# Patient Record
Sex: Female | Born: 1940
Health system: Southern US, Community
[De-identification: ages and names within clinical notes are randomized; demographics above are authoritative.]

## PROBLEM LIST (undated history)

## (undated) DIAGNOSIS — E119 Type 2 diabetes mellitus without complications: Secondary | ICD-10-CM

## (undated) DIAGNOSIS — I10 Essential (primary) hypertension: Secondary | ICD-10-CM

## (undated) DIAGNOSIS — C50919 Malignant neoplasm of unspecified site of unspecified female breast: Secondary | ICD-10-CM

## (undated) DIAGNOSIS — N182 Chronic kidney disease, stage 2 (mild): Secondary | ICD-10-CM

## (undated) DIAGNOSIS — M1990 Unspecified osteoarthritis, unspecified site: Secondary | ICD-10-CM

## (undated) DIAGNOSIS — E785 Hyperlipidemia, unspecified: Secondary | ICD-10-CM

## (undated) HISTORY — PX: ABDOMINAL HYSTERECTOMY: SHX81

## (undated) HISTORY — DX: Malignant neoplasm of unspecified site of unspecified female breast: C50.919

## (undated) HISTORY — PX: EYE SURGERY: SHX253

## (undated) HISTORY — DX: Essential (primary) hypertension: I10

## (undated) HISTORY — DX: Chronic kidney disease, stage 2 (mild): N18.2

## (undated) HISTORY — DX: Type 2 diabetes mellitus without complications: E11.9

## (undated) HISTORY — DX: Hyperlipidemia, unspecified: E78.5

## (undated) HISTORY — PX: BREAST BIOPSY: SHX20

---

## 2004-12-21 ENCOUNTER — Emergency Department (HOSPITAL_COMMUNITY): Admission: EM | Admit: 2004-12-21 | Discharge: 2004-12-21 | Payer: Self-pay | Admitting: Emergency Medicine

## 2006-04-22 ENCOUNTER — Ambulatory Visit (HOSPITAL_COMMUNITY): Admission: RE | Admit: 2006-04-22 | Discharge: 2006-04-22 | Payer: Self-pay | Admitting: Internal Medicine

## 2006-06-03 ENCOUNTER — Ambulatory Visit (HOSPITAL_COMMUNITY): Admission: RE | Admit: 2006-06-03 | Discharge: 2006-06-03 | Payer: Self-pay | Admitting: Family Medicine

## 2006-06-04 ENCOUNTER — Ambulatory Visit (HOSPITAL_COMMUNITY): Admission: RE | Admit: 2006-06-04 | Discharge: 2006-06-04 | Payer: Self-pay | Admitting: Family Medicine

## 2006-06-07 ENCOUNTER — Encounter (HOSPITAL_COMMUNITY): Admission: RE | Admit: 2006-06-07 | Discharge: 2006-07-07 | Payer: Self-pay | Admitting: Family Medicine

## 2006-06-11 ENCOUNTER — Ambulatory Visit (HOSPITAL_COMMUNITY): Admission: RE | Admit: 2006-06-11 | Discharge: 2006-06-11 | Payer: Self-pay | Admitting: Family Medicine

## 2006-09-20 ENCOUNTER — Ambulatory Visit (HOSPITAL_COMMUNITY): Admission: RE | Admit: 2006-09-20 | Discharge: 2006-09-20 | Payer: Self-pay | Admitting: Family Medicine

## 2006-10-19 ENCOUNTER — Ambulatory Visit (HOSPITAL_COMMUNITY): Admission: RE | Admit: 2006-10-19 | Discharge: 2006-10-19 | Payer: Self-pay | Admitting: General Surgery

## 2007-04-25 ENCOUNTER — Ambulatory Visit (HOSPITAL_COMMUNITY): Admission: RE | Admit: 2007-04-25 | Discharge: 2007-04-25 | Payer: Self-pay | Admitting: Internal Medicine

## 2007-10-11 ENCOUNTER — Emergency Department (HOSPITAL_COMMUNITY): Admission: EM | Admit: 2007-10-11 | Discharge: 2007-10-11 | Payer: Self-pay | Admitting: Emergency Medicine

## 2008-03-27 ENCOUNTER — Emergency Department (HOSPITAL_COMMUNITY): Admission: EM | Admit: 2008-03-27 | Discharge: 2008-03-27 | Payer: Self-pay | Admitting: Emergency Medicine

## 2008-04-25 ENCOUNTER — Ambulatory Visit (HOSPITAL_COMMUNITY): Admission: RE | Admit: 2008-04-25 | Discharge: 2008-04-25 | Payer: Self-pay | Admitting: Internal Medicine

## 2008-10-09 ENCOUNTER — Emergency Department (HOSPITAL_COMMUNITY): Admission: EM | Admit: 2008-10-09 | Discharge: 2008-10-09 | Payer: Self-pay | Admitting: Emergency Medicine

## 2009-01-02 ENCOUNTER — Ambulatory Visit (HOSPITAL_COMMUNITY): Admission: RE | Admit: 2009-01-02 | Discharge: 2009-01-02 | Payer: Self-pay | Admitting: Family Medicine

## 2009-05-01 ENCOUNTER — Ambulatory Visit (HOSPITAL_COMMUNITY): Admission: RE | Admit: 2009-05-01 | Discharge: 2009-05-01 | Payer: Self-pay | Admitting: Internal Medicine

## 2010-01-04 ENCOUNTER — Emergency Department (HOSPITAL_COMMUNITY): Admission: EM | Admit: 2010-01-04 | Discharge: 2010-01-04 | Payer: Self-pay | Admitting: Emergency Medicine

## 2010-02-04 ENCOUNTER — Emergency Department (HOSPITAL_COMMUNITY): Admission: EM | Admit: 2010-02-04 | Discharge: 2010-02-04 | Payer: Self-pay | Admitting: Emergency Medicine

## 2010-02-20 ENCOUNTER — Ambulatory Visit (HOSPITAL_COMMUNITY): Admission: RE | Admit: 2010-02-20 | Discharge: 2010-02-20 | Payer: Self-pay | Admitting: Internal Medicine

## 2010-05-06 ENCOUNTER — Ambulatory Visit (HOSPITAL_COMMUNITY): Admission: RE | Admit: 2010-05-06 | Discharge: 2010-05-06 | Payer: Self-pay | Admitting: Internal Medicine

## 2010-11-17 LAB — DIFFERENTIAL
Basophils Absolute: 0 10*3/uL (ref 0.0–0.1)
Basophils Relative: 0 % (ref 0–1)
Eosinophils Absolute: 0.1 10*3/uL (ref 0.0–0.7)
Eosinophils Relative: 2 % (ref 0–5)
Lymphocytes Relative: 28 % (ref 12–46)
Lymphs Abs: 1.5 10*3/uL (ref 0.7–4.0)
Monocytes Absolute: 0.2 10*3/uL (ref 0.1–1.0)
Monocytes Relative: 4 % (ref 3–12)
Neutro Abs: 3.7 10*3/uL (ref 1.7–7.7)
Neutrophils Relative %: 66 % (ref 43–77)

## 2010-11-17 LAB — COMPREHENSIVE METABOLIC PANEL
ALT: 17 U/L (ref 0–35)
AST: 23 U/L (ref 0–37)
Albumin: 3.9 g/dL (ref 3.5–5.2)
Alkaline Phosphatase: 74 U/L (ref 39–117)
BUN: 23 mg/dL (ref 6–23)
CO2: 31 mEq/L (ref 19–32)
Calcium: 9.8 mg/dL (ref 8.4–10.5)
Chloride: 105 mEq/L (ref 96–112)
Creatinine, Ser: 1.18 mg/dL (ref 0.4–1.2)
GFR calc Af Amer: 55 mL/min — ABNORMAL LOW (ref >60)
GFR calc non Af Amer: 45 mL/min — ABNORMAL LOW (ref >60)
Glucose, Bld: 88 mg/dL (ref 70–99)
Potassium: 4.4 mEq/L (ref 3.5–5.1)
Sodium: 142 mEq/L (ref 135–145)
Total Bilirubin: 0.5 mg/dL (ref 0.3–1.2)
Total Protein: 7.3 g/dL (ref 6.0–8.3)

## 2010-11-17 LAB — URINALYSIS, ROUTINE W REFLEX MICROSCOPIC
Bilirubin Urine: NEGATIVE
Glucose, UA: NEGATIVE mg/dL
Hgb urine dipstick: NEGATIVE
Ketones, ur: NEGATIVE mg/dL
Nitrite: NEGATIVE
Protein, ur: NEGATIVE mg/dL
Specific Gravity, Urine: 1.025 (ref 1.005–1.030)
Urobilinogen, UA: 0.2 mg/dL (ref 0.0–1.0)
pH: 5.5 (ref 5.0–8.0)

## 2010-11-17 LAB — CBC
HCT: 35.2 % — ABNORMAL LOW (ref 36.0–46.0)
Hemoglobin: 11.5 g/dL — ABNORMAL LOW (ref 12.0–15.0)
MCHC: 32.7 g/dL (ref 30.0–36.0)
MCV: 90.2 fL (ref 78.0–100.0)
Platelets: 213 10*3/uL (ref 150–400)
RBC: 3.9 MIL/uL (ref 3.87–5.11)
RDW: 13.6 % (ref 11.5–15.5)
WBC: 5.5 10*3/uL (ref 4.0–10.5)

## 2010-11-17 LAB — D-DIMER, QUANTITATIVE (NOT AT ARMC): D-Dimer, Quant: 0.74 ug/mL-FEU — ABNORMAL HIGH (ref 0.00–0.48)

## 2010-11-17 LAB — POCT CARDIAC MARKERS
CKMB, poc: 1.3 ng/mL (ref 1.0–8.0)
Myoglobin, poc: 95.7 ng/mL (ref 12–200)
Troponin i, poc: <0.05 ng/mL (ref 0.00–0.09)

## 2011-01-16 NOTE — Procedures (Signed)
[name], [name] NO.:  [id number]   MEDICAL RECORD NO.:  [id number]          PATIENT TYPE:  OUT   LOCATION:  RAD                           FACILITY:  [facility]   PHYSICIAN:  Dani Gobble, MD       DATE OF BIRTH:  29-May-1941   DATE OF PROCEDURE:  09/20/2006  DATE OF DISCHARGE:                                ECHOCARDIOGRAM   REFERRING PHYSICIAN:  Dr. Phillips Odor   INDICATIONS:  A 70 year old female with a past medical history of  hypertension and diabetes who was referred for evaluation of a murmur.   The technique quality of this study is adequate.   The aorta measures normally at 3.0 cm.  However, the proximal ascending  aorta measures at the upper limits of normal at 3.63 cm.   The left atrium is mildly dilated, measured at 4.2 cm.  The patient  appeared to be in sinus rhythm during the procedure.   The interventricular septum and posterior wall are mildly thickened with  additional basal septal hypertrophy overlay.   The aortic valve is trileaflet with moderate calcification of the  noncoronary cusp.  Peak velocity across the aortic valve was 1.6 meters  per second, corresponding to a peak gradient of 10 mmHg and a mean  gradient of 6 mmHg.  No significant aortic insufficiency is appreciated.   The mitral valve is notable for mild thickening and calcification but  without limitation of leaflet excursion.  No significant mitral  regurgitation is noted.  Doppler interrogation of the mitral valve is  within normal limits.   The pulmonic valve appears grossly structurally normal.   Tricuspid valve also appears grossly structurally normal with trace to  mild tricuspid regurgitation noted.   The left ventricle is normal in size with LV IDD measuring 3.8 cm and LV  IC measuring 2.7 cm.  Overall ejection fraction is normal.  In the  apical two and three chambers the inferior and posterior wall are  possibly mildly hypokinetic, however they are not  well-visualized from  this.  In no other views do they appear to be hypokinetic.   The right atrium and right ventricle are borderline dilated with  preserved right ventricular systolic function.   IMPRESSION:  1. The proximal ascending aorta is at the upper limits of normal,      measured at 3.6 cm.  2. Mild biatrial enlargement.  #3  Mild concentric left ventricular hypertrophy with additional basal  septal hypertrophy overlay.  1. Mild aortic sclerosis without stenosis.  2. Mildly thickened and calcified mitral valve without limitation to      leaflet excursion.  3. Trace to mild tricuspid regurgitation.  4. Normal left ventricular size and overall ejection fraction.  In one      view only (apical views) the inferior and posterior wall may be      mildly hypokinetic but this is not appreciated in any other view      and overall ejection fraction is, again, normal.  5. Borderline dilatation of the right ventricle with preserved right      ventricular systolic function  ______________________________  Dani Gobble, MD     AB/MEDQ  D:  09/20/2006  T:  09/20/2006  Job:  [id number]   cc:   Corrie Mckusick, M.D.  Fax: [phone]   Dani Gobble, MD  Fax: [phone]

## 2011-01-16 NOTE — H&P (Signed)
[name], [name] NO.:  [id number]   MEDICAL RECORD NO.:  [id number]          PATIENT TYPE:  AMB   LOCATION:                                FACILITY:  [facility]   PHYSICIAN:  Dalia Heading, M.D.  DATE OF BIRTH:  1940-11-30   DATE OF ADMISSION:  DATE OF DISCHARGE:  LH                              HISTORY & PHYSICAL   CHIEF COMPLAINT:  Need for screening colonoscopy, mild anemia.   HISTORY OF PRESENT ILLNESS:  The patient is a 70 year old black female  who is referred for endoscopic evaluation.  She needs a colonoscopy for  screening purposes.  No abdominal pain, weight loss, nausea, vomiting,  diarrhea, constipation, melena or hematochezia have been noted.  There  is no family history of colon carcinoma.  She has a remote history of a  colonoscopy in the past.  She was noted to be mildly anemic on a recent  blood test.   PAST MEDICAL HISTORY:  1. Includes hypertension.  2. Breast cancer.   PAST SURGICAL HISTORY:  Breast cancer surgery.   CURRENT MEDICATIONS:  Benicar.   ALLERGIES:  No known drug allergies.   REVIEW OF SYSTEMS:  Noncontributory.   PHYSICAL EXAMINATION:  The patient is a well-developed, well-nourished  black female in no acute distress.  LUNGS:  Clear to auscultation with equal breath sounds bilaterally.  HEART:  Examination reveals regular rate and rhythm without S3, S4, or  murmurs.  ABDOMEN:  Is soft, nontender, nondistended.  No hepatosplenomegaly or  masses are noted.  RECTAL:  Examination was deferred to the procedure.   IMPRESSION:  1. Need for screening colonoscopy.  2. Anemia.   PLAN:  The patient is scheduled for colonoscopy on October 26, 2006.  The risks and benefits of the procedure including bleeding and  perforation were fully explained to the patient, gave informed consent.      Dalia Heading, M.D.  Electronically Signed     MAJ/MEDQ  D:  10/14/2006  T:  10/14/2006  Job:  [id number]   cc:   Corrie Mckusick,  M.D.  Fax: [phone]

## 2011-01-23 ENCOUNTER — Other Ambulatory Visit (HOSPITAL_COMMUNITY): Payer: Self-pay | Admitting: Internal Medicine

## 2011-01-23 DIAGNOSIS — IMO0001 Reserved for inherently not codable concepts without codable children: Secondary | ICD-10-CM

## 2011-01-29 ENCOUNTER — Ambulatory Visit (HOSPITAL_COMMUNITY)
Admission: RE | Admit: 2011-01-29 | Discharge: 2011-01-29 | Disposition: A | Discharge status: Home / Self Care | Payer: Medicare HMO | Source: Ambulatory Visit | Attending: Internal Medicine | Admitting: Internal Medicine

## 2011-01-29 ENCOUNTER — Encounter (HOSPITAL_COMMUNITY): Payer: Self-pay

## 2011-01-29 DIAGNOSIS — IMO0001 Reserved for inherently not codable concepts without codable children: Secondary | ICD-10-CM

## 2011-01-29 DIAGNOSIS — Z780 Asymptomatic menopausal state: Secondary | ICD-10-CM | POA: Insufficient documentation

## 2011-04-07 ENCOUNTER — Other Ambulatory Visit (HOSPITAL_COMMUNITY): Payer: Self-pay | Admitting: Internal Medicine

## 2011-04-07 DIAGNOSIS — Z139 Encounter for screening, unspecified: Secondary | ICD-10-CM

## 2011-05-05 ENCOUNTER — Other Ambulatory Visit (HOSPITAL_COMMUNITY): Payer: Self-pay | Admitting: Family Medicine

## 2011-05-05 ENCOUNTER — Ambulatory Visit (HOSPITAL_COMMUNITY)
Admission: RE | Admit: 2011-05-05 | Discharge: 2011-05-05 | Disposition: A | Discharge status: Home / Self Care | Payer: Medicare HMO | Source: Ambulatory Visit | Attending: Family Medicine | Admitting: Family Medicine

## 2011-05-05 ENCOUNTER — Encounter (HOSPITAL_COMMUNITY): Payer: Self-pay

## 2011-05-05 DIAGNOSIS — M25529 Pain in unspecified elbow: Secondary | ICD-10-CM | POA: Insufficient documentation

## 2011-05-05 DIAGNOSIS — M898X9 Other specified disorders of bone, unspecified site: Secondary | ICD-10-CM | POA: Insufficient documentation

## 2011-05-05 DIAGNOSIS — M779 Enthesopathy, unspecified: Secondary | ICD-10-CM

## 2011-05-05 DIAGNOSIS — J069 Acute upper respiratory infection, unspecified: Secondary | ICD-10-CM

## 2011-05-11 ENCOUNTER — Ambulatory Visit (HOSPITAL_COMMUNITY)
Admission: RE | Admit: 2011-05-11 | Discharge: 2011-05-11 | Disposition: A | Discharge status: Home / Self Care | Payer: Medicare HMO | Source: Ambulatory Visit | Attending: Internal Medicine | Admitting: Internal Medicine

## 2011-05-11 DIAGNOSIS — Z1231 Encounter for screening mammogram for malignant neoplasm of breast: Secondary | ICD-10-CM | POA: Insufficient documentation

## 2011-05-11 DIAGNOSIS — Z139 Encounter for screening, unspecified: Secondary | ICD-10-CM

## 2011-05-22 LAB — URINALYSIS, ROUTINE W REFLEX MICROSCOPIC
Bilirubin Urine: NEGATIVE
Glucose, UA: NEGATIVE
Ketones, ur: NEGATIVE
Nitrite: NEGATIVE
Protein, ur: NEGATIVE
Specific Gravity, Urine: 1.025
Urobilinogen, UA: 0.2
pH: 5.5

## 2011-05-22 LAB — URINE MICROSCOPIC-ADD ON

## 2011-05-29 LAB — BASIC METABOLIC PANEL
BUN: 24 — ABNORMAL HIGH
CO2: 26
Calcium: 9.5
Chloride: 106
Creatinine, Ser: 1.73 — ABNORMAL HIGH
GFR calc Af Amer: 35 — ABNORMAL LOW
GFR calc non Af Amer: 29 — ABNORMAL LOW
Glucose, Bld: 91
Potassium: 5.2 — ABNORMAL HIGH
Sodium: 137

## 2011-05-29 LAB — POCT CARDIAC MARKERS
CKMB, poc: 2.1
Myoglobin, poc: 81
Troponin i, poc: <0.05

## 2011-05-29 LAB — CBC
HCT: 36.8
Hemoglobin: 12.1
MCHC: 33
MCV: 88.4
Platelets: 240
RBC: 4.16
RDW: 13.3
WBC: 4.3

## 2011-05-29 LAB — DIFFERENTIAL
Basophils Absolute: 0
Basophils Relative: 1
Eosinophils Absolute: 0.1
Eosinophils Relative: 2
Lymphocytes Relative: 34
Lymphs Abs: 1.5
Monocytes Absolute: 0.4
Monocytes Relative: 10
Neutro Abs: 2.3
Neutrophils Relative %: 53

## 2011-12-01 ENCOUNTER — Ambulatory Visit (HOSPITAL_COMMUNITY)
Admission: RE | Admit: 2011-12-01 | Discharge: 2011-12-01 | Disposition: A | Discharge status: Home / Self Care | Payer: Medicare HMO | Source: Ambulatory Visit | Attending: Internal Medicine | Admitting: Internal Medicine

## 2011-12-01 ENCOUNTER — Other Ambulatory Visit (HOSPITAL_COMMUNITY): Payer: Self-pay | Admitting: Internal Medicine

## 2011-12-01 DIAGNOSIS — M47812 Spondylosis without myelopathy or radiculopathy, cervical region: Secondary | ICD-10-CM | POA: Insufficient documentation

## 2011-12-01 DIAGNOSIS — R51 Headache: Secondary | ICD-10-CM

## 2012-04-18 ENCOUNTER — Encounter (INDEPENDENT_AMBULATORY_CARE_PROVIDER_SITE_OTHER): Payer: Medicare HMO | Admitting: Ophthalmology

## 2012-04-21 ENCOUNTER — Encounter (INDEPENDENT_AMBULATORY_CARE_PROVIDER_SITE_OTHER): Payer: Medicare HMO | Admitting: Ophthalmology

## 2012-04-21 DIAGNOSIS — E1165 Type 2 diabetes mellitus with hyperglycemia: Secondary | ICD-10-CM

## 2012-04-21 DIAGNOSIS — E1139 Type 2 diabetes mellitus with other diabetic ophthalmic complication: Secondary | ICD-10-CM

## 2012-04-21 DIAGNOSIS — H2510 Age-related nuclear cataract, unspecified eye: Secondary | ICD-10-CM

## 2012-04-21 DIAGNOSIS — I10 Essential (primary) hypertension: Secondary | ICD-10-CM

## 2012-04-21 DIAGNOSIS — E11319 Type 2 diabetes mellitus with unspecified diabetic retinopathy without macular edema: Secondary | ICD-10-CM

## 2012-04-21 DIAGNOSIS — H35039 Hypertensive retinopathy, unspecified eye: Secondary | ICD-10-CM

## 2012-04-21 DIAGNOSIS — H43819 Vitreous degeneration, unspecified eye: Secondary | ICD-10-CM

## 2012-05-12 ENCOUNTER — Other Ambulatory Visit (HOSPITAL_COMMUNITY): Payer: Self-pay | Admitting: Internal Medicine

## 2012-05-12 DIAGNOSIS — Z139 Encounter for screening, unspecified: Secondary | ICD-10-CM

## 2012-05-16 ENCOUNTER — Ambulatory Visit (HOSPITAL_COMMUNITY)
Admission: RE | Admit: 2012-05-16 | Discharge: 2012-05-16 | Disposition: A | Discharge status: Home / Self Care | Payer: Medicare HMO | Source: Ambulatory Visit | Attending: Internal Medicine | Admitting: Internal Medicine

## 2012-05-16 DIAGNOSIS — Z139 Encounter for screening, unspecified: Secondary | ICD-10-CM

## 2012-05-16 DIAGNOSIS — Z1231 Encounter for screening mammogram for malignant neoplasm of breast: Secondary | ICD-10-CM | POA: Insufficient documentation

## 2012-11-10 ENCOUNTER — Other Ambulatory Visit (HOSPITAL_COMMUNITY): Payer: Self-pay | Admitting: Family Medicine

## 2012-11-10 DIAGNOSIS — M5030 Other cervical disc degeneration, unspecified cervical region: Secondary | ICD-10-CM

## 2012-11-14 ENCOUNTER — Encounter (HOSPITAL_COMMUNITY): Payer: Self-pay

## 2012-11-14 ENCOUNTER — Ambulatory Visit (HOSPITAL_COMMUNITY)
Admission: RE | Admit: 2012-11-14 | Discharge: 2012-11-14 | Disposition: A | Discharge status: Home / Self Care | Payer: Medicare HMO | Source: Ambulatory Visit | Attending: Family Medicine | Admitting: Family Medicine

## 2012-11-14 DIAGNOSIS — M5380 Other specified dorsopathies, site unspecified: Secondary | ICD-10-CM | POA: Insufficient documentation

## 2012-11-14 DIAGNOSIS — M5030 Other cervical disc degeneration, unspecified cervical region: Secondary | ICD-10-CM

## 2012-11-14 DIAGNOSIS — M542 Cervicalgia: Secondary | ICD-10-CM | POA: Insufficient documentation

## 2012-11-14 DIAGNOSIS — R209 Unspecified disturbances of skin sensation: Secondary | ICD-10-CM | POA: Insufficient documentation

## 2012-11-14 DIAGNOSIS — M5020 Other cervical disc displacement, unspecified cervical region: Secondary | ICD-10-CM | POA: Insufficient documentation

## 2013-01-19 ENCOUNTER — Other Ambulatory Visit (HOSPITAL_COMMUNITY): Payer: Self-pay | Admitting: Internal Medicine

## 2013-01-19 DIAGNOSIS — M549 Dorsalgia, unspecified: Secondary | ICD-10-CM

## 2013-01-24 ENCOUNTER — Ambulatory Visit (HOSPITAL_COMMUNITY): Payer: Medicare HMO

## 2013-01-25 ENCOUNTER — Ambulatory Visit (HOSPITAL_COMMUNITY)
Admission: RE | Admit: 2013-01-25 | Discharge: 2013-01-25 | Disposition: A | Discharge status: Home / Self Care | Payer: Medicare HMO | Source: Ambulatory Visit | Attending: Internal Medicine | Admitting: Internal Medicine

## 2013-01-25 ENCOUNTER — Encounter (HOSPITAL_COMMUNITY): Payer: Self-pay

## 2013-01-25 DIAGNOSIS — M5126 Other intervertebral disc displacement, lumbar region: Secondary | ICD-10-CM | POA: Insufficient documentation

## 2013-01-25 DIAGNOSIS — M545 Low back pain, unspecified: Secondary | ICD-10-CM | POA: Insufficient documentation

## 2013-01-25 DIAGNOSIS — M549 Dorsalgia, unspecified: Secondary | ICD-10-CM

## 2013-03-29 ENCOUNTER — Other Ambulatory Visit (HOSPITAL_COMMUNITY): Payer: Self-pay | Admitting: Nephrology

## 2013-03-29 DIAGNOSIS — N289 Disorder of kidney and ureter, unspecified: Secondary | ICD-10-CM

## 2013-04-10 ENCOUNTER — Ambulatory Visit (HOSPITAL_COMMUNITY)
Admission: RE | Admit: 2013-04-10 | Discharge: 2013-04-10 | Disposition: A | Discharge status: Home / Self Care | Payer: Medicare HMO | Source: Ambulatory Visit | Attending: Nephrology | Admitting: Nephrology

## 2013-04-10 DIAGNOSIS — N289 Disorder of kidney and ureter, unspecified: Secondary | ICD-10-CM | POA: Insufficient documentation

## 2013-04-18 ENCOUNTER — Other Ambulatory Visit (HOSPITAL_COMMUNITY): Payer: Self-pay | Admitting: Internal Medicine

## 2013-04-18 DIAGNOSIS — Z139 Encounter for screening, unspecified: Secondary | ICD-10-CM

## 2013-04-21 ENCOUNTER — Ambulatory Visit (INDEPENDENT_AMBULATORY_CARE_PROVIDER_SITE_OTHER): Payer: Medicare HMO | Admitting: Ophthalmology

## 2013-05-18 ENCOUNTER — Ambulatory Visit (HOSPITAL_COMMUNITY)
Admission: RE | Admit: 2013-05-18 | Discharge: 2013-05-18 | Disposition: A | Discharge status: Home / Self Care | Payer: Medicare HMO | Source: Ambulatory Visit | Attending: Internal Medicine | Admitting: Internal Medicine

## 2013-05-18 DIAGNOSIS — Z139 Encounter for screening, unspecified: Secondary | ICD-10-CM

## 2013-05-18 DIAGNOSIS — Z1231 Encounter for screening mammogram for malignant neoplasm of breast: Secondary | ICD-10-CM | POA: Insufficient documentation

## 2014-05-15 ENCOUNTER — Other Ambulatory Visit (HOSPITAL_COMMUNITY): Payer: Self-pay | Admitting: Internal Medicine

## 2014-05-15 DIAGNOSIS — Z139 Encounter for screening, unspecified: Secondary | ICD-10-CM

## 2014-05-24 ENCOUNTER — Ambulatory Visit (HOSPITAL_COMMUNITY)
Admission: RE | Admit: 2014-05-24 | Discharge: 2014-05-24 | Disposition: A | Discharge status: Home / Self Care | Payer: Medicare HMO | Source: Ambulatory Visit | Attending: Internal Medicine | Admitting: Internal Medicine

## 2014-05-24 DIAGNOSIS — Z139 Encounter for screening, unspecified: Secondary | ICD-10-CM

## 2014-05-24 DIAGNOSIS — Z1231 Encounter for screening mammogram for malignant neoplasm of breast: Secondary | ICD-10-CM | POA: Insufficient documentation

## 2014-06-03 ENCOUNTER — Observation Stay (HOSPITAL_COMMUNITY)
Admission: EM | Admit: 2014-06-03 | Discharge: 2014-06-03 | Disposition: A | Discharge status: Home / Self Care | Payer: Medicare HMO | Attending: Emergency Medicine | Admitting: Emergency Medicine

## 2014-06-03 ENCOUNTER — Encounter (HOSPITAL_COMMUNITY): Payer: Self-pay | Admitting: Emergency Medicine

## 2014-06-03 ENCOUNTER — Emergency Department (HOSPITAL_COMMUNITY): Payer: Medicare HMO

## 2014-06-03 DIAGNOSIS — I10 Essential (primary) hypertension: Secondary | ICD-10-CM | POA: Diagnosis not present

## 2014-06-03 DIAGNOSIS — E119 Type 2 diabetes mellitus without complications: Secondary | ICD-10-CM | POA: Diagnosis not present

## 2014-06-03 DIAGNOSIS — R079 Chest pain, unspecified: Secondary | ICD-10-CM | POA: Diagnosis present

## 2014-06-03 DIAGNOSIS — I369 Nonrheumatic tricuspid valve disorder, unspecified: Secondary | ICD-10-CM

## 2014-06-03 DIAGNOSIS — Z79899 Other long term (current) drug therapy: Secondary | ICD-10-CM | POA: Insufficient documentation

## 2014-06-03 DIAGNOSIS — R0602 Shortness of breath: Secondary | ICD-10-CM | POA: Diagnosis not present

## 2014-06-03 DIAGNOSIS — R739 Hyperglycemia, unspecified: Secondary | ICD-10-CM

## 2014-06-03 DIAGNOSIS — E871 Hypo-osmolality and hyponatremia: Secondary | ICD-10-CM

## 2014-06-03 LAB — BASIC METABOLIC PANEL
Anion gap: 14 (ref 5–15)
BUN: 19 mg/dL (ref 6–23)
CO2: 22 mEq/L (ref 19–32)
Calcium: 9.2 mg/dL (ref 8.4–10.5)
Chloride: 97 mEq/L (ref 96–112)
Creatinine, Ser: 1.24 mg/dL — ABNORMAL HIGH (ref 0.50–1.10)
GFR calc Af Amer: 48 mL/min — ABNORMAL LOW (ref >90)
GFR calc non Af Amer: 42 mL/min — ABNORMAL LOW (ref >90)
Glucose, Bld: 180 mg/dL — ABNORMAL HIGH (ref 70–99)
Potassium: 4.4 mEq/L (ref 3.7–5.3)
Sodium: 133 mEq/L — ABNORMAL LOW (ref 137–147)

## 2014-06-03 LAB — TROPONIN I
Troponin I: <0.3 ng/mL (ref <0.30)
Troponin I: <0.3 ng/mL (ref <0.30)

## 2014-06-03 LAB — LIPID PANEL
Cholesterol: 188 mg/dL (ref 0–200)
HDL: 62 mg/dL (ref >39)
LDL Cholesterol: 99 mg/dL (ref 0–99)
Total CHOL/HDL Ratio: 3 RATIO
Triglycerides: 137 mg/dL (ref <150)
VLDL: 27 mg/dL (ref 0–40)

## 2014-06-03 LAB — CBC WITH DIFFERENTIAL/PLATELET
Basophils Absolute: 0 10*3/uL (ref 0.0–0.1)
Basophils Relative: 0 % (ref 0–1)
Eosinophils Absolute: 0 10*3/uL (ref 0.0–0.7)
Eosinophils Relative: 0 % (ref 0–5)
HCT: 34.8 % — ABNORMAL LOW (ref 36.0–46.0)
Hemoglobin: 11.4 g/dL — ABNORMAL LOW (ref 12.0–15.0)
Lymphocytes Relative: 13 % (ref 12–46)
Lymphs Abs: 0.6 10*3/uL — ABNORMAL LOW (ref 0.7–4.0)
MCH: 29.3 pg (ref 26.0–34.0)
MCHC: 32.8 g/dL (ref 30.0–36.0)
MCV: 89.5 fL (ref 78.0–100.0)
Monocytes Absolute: 0.3 10*3/uL (ref 0.1–1.0)
Monocytes Relative: 6 % (ref 3–12)
Neutro Abs: 3.9 10*3/uL (ref 1.7–7.7)
Neutrophils Relative %: 81 % — ABNORMAL HIGH (ref 43–77)
Platelets: 247 10*3/uL (ref 150–400)
RBC: 3.89 MIL/uL (ref 3.87–5.11)
RDW: 13.2 % (ref 11.5–15.5)
WBC: 4.8 10*3/uL (ref 4.0–10.5)

## 2014-06-03 LAB — HEMOGLOBIN A1C
Hgb A1c MFr Bld: 7.1 % — ABNORMAL HIGH (ref <5.7)
Mean Plasma Glucose: 157 mg/dL — ABNORMAL HIGH (ref <117)

## 2014-06-03 LAB — I-STAT TROPONIN, ED: Troponin i, poc: 0.01 ng/mL (ref 0.00–0.08)

## 2014-06-03 MED ORDER — ENOXAPARIN SODIUM 30 MG/0.3ML ~~LOC~~ SOLN
30.0000 mg | SUBCUTANEOUS | Status: DC
  Administered 2014-06-03: 30 mg via SUBCUTANEOUS
  Filled 2014-06-03: qty 0.3

## 2014-06-03 MED ORDER — ASPIRIN EC 325 MG PO TBEC
325.0000 mg | DELAYED_RELEASE_TABLET | Freq: Every day | ORAL | Status: DC
  Administered 2014-06-03: 325 mg via ORAL
  Filled 2014-06-03: qty 1

## 2014-06-03 MED ORDER — SODIUM CHLORIDE 0.9 % IJ SOLN
3.0000 mL | Freq: Two times a day (BID) | INTRAMUSCULAR | Status: DC
Start: 2014-06-03 — End: 2014-06-03
  Administered 2014-06-03: 3 mL via INTRAVENOUS

## 2014-06-03 MED ORDER — ACETAMINOPHEN 325 MG PO TABS
650.0000 mg | ORAL_TABLET | Freq: Four times a day (QID) | ORAL | Status: DC | PRN
  Administered 2014-06-03: 650 mg via ORAL
  Filled 2014-06-03: qty 2

## 2014-06-03 MED ORDER — LOSARTAN POTASSIUM 50 MG PO TABS
100.0000 mg | ORAL_TABLET | Freq: Every day | ORAL | Status: DC
  Administered 2014-06-03: 100 mg via ORAL
  Filled 2014-06-03: qty 2

## 2014-06-03 MED ORDER — ASPIRIN 81 MG PO CHEW
CHEWABLE_TABLET | ORAL | Status: AC
  Filled 2014-06-03: qty 1

## 2014-06-03 MED ORDER — ENOXAPARIN SODIUM 40 MG/0.4ML ~~LOC~~ SOLN: 40.0000 mg | SUBCUTANEOUS | Status: DC

## 2014-06-03 MED ORDER — ASPIRIN EC 81 MG PO TBEC: 81.0000 mg | DELAYED_RELEASE_TABLET | Freq: Every day | ORAL | Status: DC

## 2014-06-03 MED ORDER — NITROGLYCERIN 0.4 MG SL SUBL
0.4000 mg | SUBLINGUAL_TABLET | Freq: Once | SUBLINGUAL | Status: AC
  Administered 2014-06-03: 0.4 mg via SUBLINGUAL

## 2014-06-03 MED ORDER — SODIUM CHLORIDE 0.9 % IV SOLN
INTRAVENOUS | Status: DC
  Administered 2014-06-03: 06:00:00 via INTRAVENOUS

## 2014-06-03 MED ORDER — NITROGLYCERIN 0.4 MG SL SUBL
SUBLINGUAL_TABLET | SUBLINGUAL | Status: AC
  Filled 2014-06-03: qty 1

## 2014-06-03 MED ORDER — ACETAMINOPHEN 650 MG RE SUPP: 650.0000 mg | Freq: Four times a day (QID) | RECTAL | Status: DC | PRN

## 2014-06-03 MED ORDER — ASPIRIN 81 MG PO CHEW
CHEWABLE_TABLET | ORAL | Status: AC
  Filled 2014-06-03: qty 4

## 2014-06-03 MED ORDER — ASPIRIN 81 MG PO CHEW
324.0000 mg | CHEWABLE_TABLET | Freq: Once | ORAL | Status: AC
  Administered 2014-06-03: 324 mg via ORAL

## 2014-06-03 NOTE — Discharge Summary (Signed)
Physician Discharge Summary  [name] BEE:[id number] DOB: 08/01/1941 DOA: 06/03/2014  PCP: Glo Herring., MD  Admit date: 06/03/2014 Discharge date: 06/03/2014  Recommendations for Outpatient Follow-up:  1. Chest pain, atypical. Plan outpatient followup with PCP and cardiology.    Follow-up Information   Follow up with Glo Herring., MD. Schedule an appointment as soon as possible for a visit in 1 week.   Specialty:  Internal Medicine   Contact information:   [address] [place] [address] [phone]      Discharge Diagnoses:  1. Chest pain, atypical 2. Hyponatremia 3. HTN 4. DM  Discharge Condition: improved Disposition: home  Diet recommendation: heart healthy  Filed Weights   06/03/14 0200 06/03/14 0314  Weight: 88.678 kg (195 lb 8 oz) 88.4 kg (194 lb 14.2 oz)    History of present illness:  73yow presented with substernal chest pressure, treated in ED with relief of pain, admitted for CP evaluation.  Hospital Course:  [name] was observed overnight, had no recurrent pain and ruled out for ACS. Noncardiac CP suspected. Plan home with outpatient follow-up. Counseled to return to for pain.  1. Chest pain, resolved, some typical/atypical features.  2. Hyponatremia, minimal, secondary to HCTZ, f/u as an outpatient. 3. DM stable. 4. HTN. Stable. No recurrent pain, suspect noncardiac. Troponins negative, telemetry SR, echo reassuring, no evidence of ACS. Patient wants to go home, there is no cardiology or stress echo on weekends. Feel patient low risk, can f/u as outpatient.  Plan start ASA 81mg  and f/u with PCP and cardiology as an outpatient.   Consultants: none Procedures:  2-D echocardiogram. LVEF 55-60%. Normal wall motion, no regional wall motion abnormalities. Grade 1 diastolic dysfunction.  Discharge Instructions  Discharge Instructions   Activity as tolerated - No restrictions    Complete by:  As directed      Diet -  low sodium heart healthy    Complete by:  As directed      Diet Carb Modified    Complete by:  As directed      Discharge instructions    Complete by:  As directed   Call your physician or seek immediate medical attention for chest pain, shortness of breath or worsening of condition.          Current Discharge Medication List    START taking these medications   Details  aspirin EC 81 MG tablet Take 1 tablet (81 mg total) by mouth daily.      CONTINUE these medications which have NOT CHANGED   Details  ferrous sulfate 325 (65 FE) MG tablet Take 1 tablet by mouth daily.    losartan-hydrochlorothiazide (HYZAAR) 100-25 MG per tablet Take 1 tablet by mouth daily.      STOP taking these medications     losartan (COZAAR) 100 MG tablet        No Known Allergies  The results of significant diagnostics from this hospitalization (including imaging, microbiology, ancillary and laboratory) are listed below for reference.    Significant Diagnostic Studies: Dg Chest Portable 1 View  06/03/2014   CLINICAL DATA:  Midsternal chest pain starting at 7 p.m. Diarrhea for 1 day.  EXAM: PORTABLE CHEST - 1 VIEW  COMPARISON:  02/04/2010  FINDINGS: The heart size and mediastinal contours are within normal limits. Both lungs are clear. The visualized skeletal structures are unremarkable.  IMPRESSION: No active disease.   Electronically Signed   By: Lucienne Capers M.D.   On: 06/03/2014 01:43  Labs: Basic Metabolic Panel:  Recent Labs Lab 06/03/14 0052  NA 133*  K 4.4  CL 97  CO2 22  GLUCOSE 180*  BUN 19  CREATININE 1.24*  CALCIUM 9.2   CBC:  Recent Labs Lab 06/03/14 0052  WBC 4.8  NEUTROABS 3.9  HGB 11.4*  HCT 34.8*  MCV 89.5  PLT 247   Cardiac Enzymes:  Recent Labs Lab 06/03/14 0811 06/03/14 1335  TROPONINI <0.30 <0.30    Principal Problem:   Chest pain Active Problems:   Chest pain, rule out acute myocardial infarction   Essential hypertension    Hyperglycemia   Time coordinating discharge: 35 minutes  Signed:  [name], MD [facility] 06/03/2014, 4:59 PM

## 2014-06-03 NOTE — Progress Notes (Signed)
  Echocardiogram 2D Echocardiogram has been performed.  Avanell Shackleton M 06/03/2014, 9:59 AM

## 2014-06-03 NOTE — ED Notes (Signed)
Pt reports mid sternal chest pain that started approx 7 pm, no associated symptoms

## 2014-06-03 NOTE — Progress Notes (Signed)
  PROGRESS NOTE  [name] MOQ:[id number] DOB: [birth year] DOA: 06/03/2014 PCP: Glo Herring., MD  Summary: 30yow presented with substernal chest pressure, treated in ED with relief of pain, admitted for CP evaluation.  Assessment/Plan: 1. Chest pain, resolved, some typical/atypical features.  2. Hyponatremia, minimal, secondary to HCTZ, f/u as an outpatient. 3. DM stable. 4. HTN. Stable.   No recurrent pain, suspect noncardiac. Troponins negative, telemetry SR, echo reassuring, no evidence of ACS. Patient wants to go home, there is no cardiology or stress echo on weekends. Feel patient low risk, can f/u as outpatient.  Plan start ASA 81mg  and f/u with PCP and cardiology as an outpatient.   [name], MD  [facility]  Pager [phone] If 7PM-7AM, please contact night-coverage at [email or web address], password [facility] 06/03/2014, 2:31 PM  LOS: 0 days   Consultants:    Procedures:  2-D echocardiogram. LVEF 55-60%. Normal wall motion, no regional wall motion abnormalities. Grade 1 diastolic dysfunction.  Antibiotics:    HPI/Subjective: No recurrent chest pain, no shortness of breath. No previous CP or SOB with activities.   Objective: Filed Vitals:   06/03/14 0130 06/03/14 0200 06/03/14 0314 06/03/14 0624  BP: 137/85 130/88 137/71 145/76  Pulse: 79 71 64 62  Temp:   98 F (36.7 C) 98.7 F (37.1 C)  TempSrc:   Oral Oral  Resp: 15 10 16 18   Height:  5\' 3"  (1.6 m) 5\' 3"  (1.6 m)   Weight:  88.678 kg (195 lb 8 oz) 88.4 kg (194 lb 14.2 oz)   SpO2: 98% 98% 99% 100%    Intake/Output Summary (Last 24 hours) at 06/03/14 1431 Last data filed at 06/03/14 0600  Gross per 24 hour  Intake      0 ml  Output      1 ml  Net     -1 ml     Filed Weights   06/03/14 0200 06/03/14 0314  Weight: 88.678 kg (195 lb 8 oz) 88.4 kg (194 lb 14.2 oz)    Exam:     Afebrile, VSS, no hypoxia   General: appears calm, comfortable  Psych: alert, speech fluent and  clear  CV: RRR no m/r/g.   Respiratory: CTA bilaterally, no w/r/r. Normal resp effort.  Data Reviewed:  Na+ 133, creatinine 1.24 (close to 2011 value)  troponins negative  LDL 99  Hgb 11.4, stable compared to 2011  CXR NAD  EKG NSR, suspect LVH, cannot r/o anteroseptal MI , compared to EKG 02/04/2010 viewed via Muse no acute changes seen.   Scheduled Meds: . aspirin EC  325 mg Oral Daily  . [START ON 06/04/2014] enoxaparin (LOVENOX) injection  40 mg Subcutaneous Q24H  . losartan  100 mg Oral Daily  . sodium chloride  3 mL Intravenous Q12H   Continuous Infusions: . sodium chloride 75 mL/hr at 06/03/14 0554    Principal Problem:   Chest pain Active Problems:   Chest pain, rule out acute myocardial infarction   Essential hypertension   Hyperglycemia

## 2014-06-03 NOTE — ED Provider Notes (Signed)
CSN: [id number]     Arrival date & time 06/03/14  0038 History   First MD Initiated Contact with Patient 06/03/14 0050     Chief Complaint  Patient presents with  . Chest Pain      Patient is a 73 y.o. female presenting with chest pain. The history is provided by the patient.  Chest Pain Pain location:  Substernal area Pain quality comment:  Heaviness Pain radiates to:  Does not radiate Pain severity:  Moderate Onset quality:  Gradual Duration:  5 hours Timing:  Constant Progression:  Improving Chronicity:  New Worsened by:  Exertion Associated symptoms: shortness of breath   Associated symptoms: no cough, no diaphoresis, no syncope, not vomiting and no weakness   PT reports onset of chest pain earlier tonight She has never had this pain previously She reports CP/SOB seemed worse with exertion  She had otherwise been well prior to this episode  Denies pleuritic type CP  Past Medical History  Diagnosis Date  . Diabetes mellitus   . Hypertension    History reviewed. No pertinent past surgical history. No family history on file. History  Substance Use Topics  . Smoking status: Never Smoker   . Smokeless tobacco: Not on file  . Alcohol Use: No   OB History   Grav Para Term Preterm Abortions TAB SAB Ect Mult Living                 Review of Systems  Constitutional: Negative for diaphoresis.  Respiratory: Positive for shortness of breath. Negative for cough.   Cardiovascular: Positive for chest pain. Negative for syncope.  Gastrointestinal: Negative for vomiting.  Neurological: Negative for syncope and weakness.  All other systems reviewed and are negative.     Allergies  Review of patient's allergies indicates no known allergies.  Home Medications   Prior to Admission medications   Medication Sig Start Date End Date Taking? Authorizing Provider  losartan (COZAAR) 100 MG tablet Take 100 mg by mouth daily.   Yes Historical Provider, MD   BP 121/80  Pulse  88  Resp 21  SpO2 98% Physical Exam CONSTITUTIONAL: Well developed/well nourished HEAD: Normocephalic/atraumatic EYES: EOMI/PERRL ENMT: Mucous membranes moist NECK: supple no meningeal signs SPINE:entire spine nontender CV: S1/S2 noted, no murmurs/rubs/gallops noted LUNGS: Lungs are clear to auscultation bilaterally, no apparent distress ABDOMEN: soft, nontender, no rebound or guarding GU:no cva tenderness NEURO: Pt is awake/alert, moves all extremitiesx4 EXTREMITIES: pulses normal, full ROM, no calf tenderness or edema noted SKIN: warm, color normal PSYCH: no abnormalities of mood noted  ED Course  Procedures  1:19 AM Pt given ASA/NTG and her pain is improved She is clinically stable at this time She has abnormal EKG at baseline, tonight is c/w prior EKG, NO STEMI 2:02 AM Patient is CP free at this time She is in no distress/smiling Would benefit from observeration admission due to age/risk factors for CAD Pt agreeable D/w dr Maudie Mercury, will admit Labs Review Labs Reviewed  BASIC METABOLIC PANEL - Abnormal; Notable for the following:    Sodium 133 (*)    Glucose, Bld 180 (*)    Creatinine, Ser 1.24 (*)    GFR calc non Af Amer 42 (*)    GFR calc Af Amer 48 (*)    All other components within normal limits  CBC WITH DIFFERENTIAL - Abnormal; Notable for the following:    Hemoglobin 11.4 (*)    HCT 34.8 (*)    Neutrophils Relative % 81 (*)  Lymphs Abs 0.6 (*)    All other components within normal limits  I-STAT TROPOININ, ED    Imaging Review Dg Chest Portable 1 View  06/03/2014   CLINICAL DATA:  Midsternal chest pain starting at 7 p.m. Diarrhea for 1 day.  EXAM: PORTABLE CHEST - 1 VIEW  COMPARISON:  02/04/2010  FINDINGS: The heart size and mediastinal contours are within normal limits. Both lungs are clear. The visualized skeletal structures are unremarkable.  IMPRESSION: No active disease.   Electronically Signed   By: Lucienne Capers M.D.   On: 06/03/2014 01:43      EKG Interpretation   Date/Time:  Sunday June 03 2014 00:51:26 EDT Ventricular Rate:  75 PR Interval:  167 QRS Duration: 90 QT Interval:  361 QTC Calculation: 403 R Axis:   -44 Text Interpretation:  Sinus rhythm Left anterior fascicular block Abnormal  R-wave progression, late transition Consider left ventricular hypertrophy  st elevation anteriorly seen in prior Abnormal ekg pt now has inverted t  wave in aVL, changed from prior Confirmed by [place] ([id number])  on 06/03/2014 12:58:39 AM      MDM   Final diagnoses:  Chest pain, rule out acute myocardial infarction    Nursing notes including past medical history and social history reviewed and considered in documentation xrays reviewed and considered Labs/vital reviewed and considered     Sharyon Cable, MD 06/03/14 [phone]

## 2014-06-03 NOTE — H&P (Signed)
[name] is an 73 y.o. female.   Chief Complaint: chest pain HPI: 73 yo female with  Hypertension c/o cp,  Substernal pressure without radiation,  Starting about 7pm, and which continued so at 11pm  She decided to come to ER for evaluation.  Pt denies fever chills, cough, sob, n/v, diarrhea, brbpr, black stool.  EKG: nsr with j point elevation in the anterior leads, apparently old.  Trop negative. Pt will be admitted for cp  Past Medical History  Diagnosis Date  . Diabetes mellitus   . Hypertension     Fhx:  Negative for cad History reviewed. No pertinent past surgical history.  No family history on file. Social History:  reports that she has never smoked. She does not have any smokeless tobacco history on file. She reports that she does not drink alcohol or use illicit drugs.  Allergies: No Known Allergies   (Not in a hospital admission)  Results for orders placed during the hospital encounter of 06/03/14 (from the past 48 hour(s))  BASIC METABOLIC PANEL     Status: Abnormal   Collection Time    06/03/14 12:52 AM      Result Value Ref Range   Sodium 133 (*) 137 - 147 mEq/L   Potassium 4.4  3.7 - 5.3 mEq/L   Chloride 97  96 - 112 mEq/L   CO2 22  19 - 32 mEq/L   Glucose, Bld 180 (*) 70 - 99 mg/dL   BUN 19  6 - 23 mg/dL   Creatinine, Ser 1.24 (*) 0.50 - 1.10 mg/dL   Calcium 9.2  8.4 - 10.5 mg/dL   GFR calc non Af Amer 42 (*) >90 mL/min   GFR calc Af Amer 48 (*) >90 mL/min   Comment: (NOTE)     The eGFR has been calculated using the CKD EPI equation.     This calculation has not been validated in all clinical situations.     eGFR's persistently <90 mL/min signify possible Chronic Kidney     Disease.   Anion gap 14  5 - 15  CBC WITH DIFFERENTIAL     Status: Abnormal   Collection Time    06/03/14 12:52 AM      Result Value Ref Range   WBC 4.8  4.0 - 10.5 K/uL   RBC 3.89  3.87 - 5.11 MIL/uL   Hemoglobin 11.4 (*) 12.0 - 15.0 g/dL   HCT 34.8 (*) 36.0 - 46.0 %   MCV  89.5  78.0 - 100.0 fL   MCH 29.3  26.0 - 34.0 pg   MCHC 32.8  30.0 - 36.0 g/dL   RDW 13.2  11.5 - 15.5 %   Platelets 247  150 - 400 K/uL   Neutrophils Relative % 81 (*) 43 - 77 %   Neutro Abs 3.9  1.7 - 7.7 K/uL   Lymphocytes Relative 13  12 - 46 %   Lymphs Abs 0.6 (*) 0.7 - 4.0 K/uL   Monocytes Relative 6  3 - 12 %   Monocytes Absolute 0.3  0.1 - 1.0 K/uL   Eosinophils Relative 0  0 - 5 %   Eosinophils Absolute 0.0  0.0 - 0.7 K/uL   Basophils Relative 0  0 - 1 %   Basophils Absolute 0.0  0.0 - 0.1 K/uL  I-STAT TROPOININ, ED     Status: None   Collection Time    06/03/14 12:59 AM      Result Value Ref Range  Troponin i, poc 0.01  0.00 - 0.08 ng/mL   Comment 3            Comment: Due to the release kinetics of cTnI,     a negative result within the first hours     of the onset of symptoms does not rule out     myocardial infarction with certainty.     If myocardial infarction is still suspected,     repeat the test at appropriate intervals.   Dg Chest Portable 1 View  06/03/2014   CLINICAL DATA:  Midsternal chest pain starting at 7 p.m. Diarrhea for 1 day.  EXAM: PORTABLE CHEST - 1 VIEW  COMPARISON:  02/04/2010  FINDINGS: The heart size and mediastinal contours are within normal limits. Both lungs are clear. The visualized skeletal structures are unremarkable.  IMPRESSION: No active disease.   Electronically Signed   By: Lucienne Capers M.D.   On: 06/03/2014 01:43    Review of Systems  Constitutional: Negative.   HENT: Negative.   Eyes: Negative.   Respiratory: Negative.   Cardiovascular: Positive for chest pain. Negative for palpitations, orthopnea, claudication, leg swelling and PND.  Gastrointestinal: Negative.   Genitourinary: Negative.   Musculoskeletal: Negative.   Skin: Negative.   Neurological: Negative.   Endo/Heme/Allergies: Negative.   Psychiatric/Behavioral: Negative.     Blood pressure 130/88, pulse 71, temperature 98.4 F (36.9 C), temperature source  Oral, resp. rate 10, SpO2 98.00%. Physical Exam  Constitutional: She is oriented to person, place, and time. She appears well-developed and well-nourished.  HENT:  Head: Normocephalic and atraumatic.  Eyes: Conjunctivae and EOM are normal. Pupils are equal, round, and reactive to light.  Neck: Normal range of motion. Neck supple.  Cardiovascular: Normal rate, regular rhythm and normal heart sounds.   Respiratory: Effort normal and breath sounds normal. No respiratory distress. She has no wheezes. She has no rales. She exhibits no tenderness.  GI: Soft. Bowel sounds are normal. She exhibits no distension. There is no tenderness.  Musculoskeletal: She exhibits no edema and no tenderness.  Neurological: She is alert and oriented to person, place, and time. She has normal reflexes.  Skin: Skin is warm and dry.     Assessment/Plan Chest pain:  Tele, cycle cardiac markers ,  Npo after midnite,  Stress testing in am Hypertension:  Cont losartan Hyperglycemia:  Check hga1c, lipid Hyponatremia:  Please check cmp tomorrow, if not improved consider further w/up Anemia:  Check cbc  Jani Gravel 06/03/2014, 3:01 AM

## 2014-06-03 NOTE — Progress Notes (Signed)
UR completed 

## 2014-06-27 ENCOUNTER — Encounter: Payer: Medicare HMO | Admitting: Cardiology

## 2014-07-12 ENCOUNTER — Other Ambulatory Visit (HOSPITAL_COMMUNITY): Payer: Self-pay | Admitting: Family Medicine

## 2014-07-12 ENCOUNTER — Ambulatory Visit (HOSPITAL_COMMUNITY)
Admission: RE | Admit: 2014-07-12 | Discharge: 2014-07-12 | Disposition: A | Discharge status: Home / Self Care | Payer: Medicare HMO | Source: Ambulatory Visit | Attending: Family Medicine | Admitting: Family Medicine

## 2014-07-12 DIAGNOSIS — I10 Essential (primary) hypertension: Secondary | ICD-10-CM | POA: Insufficient documentation

## 2014-07-12 DIAGNOSIS — M5431 Sciatica, right side: Secondary | ICD-10-CM

## 2014-07-12 DIAGNOSIS — M545 Low back pain: Secondary | ICD-10-CM | POA: Diagnosis present

## 2014-07-12 DIAGNOSIS — E119 Type 2 diabetes mellitus without complications: Secondary | ICD-10-CM | POA: Insufficient documentation

## 2014-07-19 ENCOUNTER — Ambulatory Visit: Payer: Medicare HMO | Admitting: Orthopedic Surgery

## 2014-08-07 ENCOUNTER — Ambulatory Visit (INDEPENDENT_AMBULATORY_CARE_PROVIDER_SITE_OTHER): Payer: Medicare HMO | Admitting: Orthopedic Surgery

## 2014-08-07 VITALS — BP 127/74 | Ht 63.0 in | Wt 194.0 lb

## 2014-08-07 DIAGNOSIS — M48062 Spinal stenosis, lumbar region with neurogenic claudication: Secondary | ICD-10-CM

## 2014-08-07 DIAGNOSIS — M51369 Other intervertebral disc degeneration, lumbar region without mention of lumbar back pain or lower extremity pain: Secondary | ICD-10-CM

## 2014-08-07 DIAGNOSIS — M5410 Radiculopathy, site unspecified: Secondary | ICD-10-CM

## 2014-08-07 DIAGNOSIS — M5136 Other intervertebral disc degeneration, lumbar region: Secondary | ICD-10-CM

## 2014-08-07 DIAGNOSIS — M4806 Spinal stenosis, lumbar region: Secondary | ICD-10-CM

## 2014-08-07 NOTE — Patient Instructions (Signed)
Make referral to spine specialist for eval of multilevel spondylosis lumbar with right leg weakness (has humana)

## 2014-08-08 ENCOUNTER — Encounter: Payer: Self-pay | Admitting: Orthopedic Surgery

## 2014-08-08 ENCOUNTER — Other Ambulatory Visit: Payer: Self-pay | Admitting: *Deleted

## 2014-08-08 ENCOUNTER — Telehealth: Payer: Self-pay | Admitting: *Deleted

## 2014-08-08 DIAGNOSIS — M48061 Spinal stenosis, lumbar region without neurogenic claudication: Secondary | ICD-10-CM

## 2014-08-08 NOTE — Progress Notes (Signed)
Patient ID: [name], female   DOB: 04-19-1941, 73 y.o.   MRN: [id number] Patient ID: [name], female   DOB: 07-23-1941, 73 y.o.   MRN: [id number]  Chief Complaint  Patient presents with  . Leg Pain    right leg pain, REF FUSCO    HPI [name] is a 73 y.o. female.  The patient presents with a long history of back pain but a recent onset of cramps in her left leg which progressed to giving out symptoms numbness tingling burning aching pain. She was treated with hydrocodone 5 mg and then 10 mg and eventually gabapentin 100 mg with no relief. Her pain is constant morning and at night she rates it 8 out of 10. She has no previous treatment other than the medication. She did have an MRI in 2007 and again in 2014 which showed. There is a broad-based disc herniation at L3 significant change from 2007 there was also a left disc herniation at L2 and 3 which was worse than in 2007  Currently the pains noted on the right side with weakness and difficulty walking. HPI  Past Medical History  Diagnosis Date  . Diabetes mellitus   . Hypertension     Past Surgical History  Procedure Laterality Date  . None    . Breast surgery    . Eye surgery      Family History  Problem Relation Age of Onset  . Diabetes    . Hypertension    . COPD    . Cancer - Other      breast    Social History History  Substance Use Topics  . Smoking status: Never Smoker   . Smokeless tobacco: Not on file  . Alcohol Use: No    No Known Allergies  Current Outpatient Prescriptions  Medication Sig Dispense Refill  . gabapentin (NEURONTIN) 100 MG capsule Take 100 mg by mouth 3 (three) times daily.    Marland Kitchen HYDROcodone-acetaminophen (NORCO/VICODIN) 5-325 MG per tablet Take 1 tablet by mouth every 6 (six) hours as needed for moderate pain.    Marland Kitchen losartan-hydrochlorothiazide (HYZAAR) 100-25 MG per tablet Take 1 tablet by mouth daily.    Marland Kitchen aspirin EC 81 MG tablet Take 1 tablet (81 mg total) by mouth  daily. (Patient not taking: Reported on 08/07/2014)    . ferrous sulfate 325 (65 FE) MG tablet Take 1 tablet by mouth daily.     No current facility-administered medications for this visit.    Review of Systems Review of Systems  She reports under review of systems normal findings except for visual disturbances ankle leg swelling back pain numbness tingling and burning in her legs with L3-4 foraminal narrowing worse than previously noted. Lateral recess and foraminal narrowing worse on the right  Blood pressure 127/74, height 5\' 3"  (1.6 m), weight 194 lb (87.998 kg).  Physical Exam Physical Exam She is well-groomed awake and alert oriented 3 mood and affect are normal she is ambulatory without assistive devices  She has lower back tenderness decreased range of motion. She has symmetric range of motion of her hips and knees with no instability or abnormal skin changes. She has symmetric dorsal pulses  She does have right hip flexor weakness compared to the left  She is no sensory loss and 2+ reflexes at the knee 1+ at the ankle.   Data Reviewed Imaging studies from 2007 show lumbar spondylosis with disc herniation and then in 2014 as described above. I have independently reviewed  the images and agree with the images as they are reported.  Assessment    Encounter Diagnoses  Name Primary?  . DDD (degenerative disc disease), lumbar Yes  . Radicular leg pain   . Spinal stenosis, lumbar region, with neurogenic claudication   . Back pain with right-sided radiculopathy         Plan    I am referring the patient to a subspecialist for further definitive care.       Arther Abbott 08/08/2014, 11:48 AM

## 2014-08-08 NOTE — Telephone Encounter (Signed)
Referral sent to [facility].  

## 2014-09-05 NOTE — Telephone Encounter (Signed)
Appt, Sep 19, 2014 @11am  with Dr Trenton Gammon

## 2014-09-10 DIAGNOSIS — Z6833 Body mass index (BMI) 33.0-33.9, adult: Secondary | ICD-10-CM | POA: Diagnosis not present

## 2014-09-10 DIAGNOSIS — M25621 Stiffness of right elbow, not elsewhere classified: Secondary | ICD-10-CM | POA: Diagnosis not present

## 2014-09-10 DIAGNOSIS — J302 Other seasonal allergic rhinitis: Secondary | ICD-10-CM | POA: Diagnosis not present

## 2014-09-19 DIAGNOSIS — M5136 Other intervertebral disc degeneration, lumbar region: Secondary | ICD-10-CM | POA: Diagnosis not present

## 2014-09-19 DIAGNOSIS — I10 Essential (primary) hypertension: Secondary | ICD-10-CM | POA: Diagnosis not present

## 2014-09-19 DIAGNOSIS — G5602 Carpal tunnel syndrome, left upper limb: Secondary | ICD-10-CM | POA: Diagnosis not present

## 2014-09-30 ENCOUNTER — Encounter (HOSPITAL_COMMUNITY): Payer: Self-pay | Admitting: *Deleted

## 2014-09-30 ENCOUNTER — Emergency Department (INDEPENDENT_AMBULATORY_CARE_PROVIDER_SITE_OTHER): Payer: Medicare HMO

## 2014-09-30 ENCOUNTER — Emergency Department (INDEPENDENT_AMBULATORY_CARE_PROVIDER_SITE_OTHER)
Admission: EM | Admit: 2014-09-30 | Discharge: 2014-09-30 | Disposition: A | Discharge status: Home / Self Care | Payer: Medicare HMO | Source: Home / Self Care | Attending: Family Medicine | Admitting: Family Medicine

## 2014-09-30 DIAGNOSIS — M25571 Pain in right ankle and joints of right foot: Secondary | ICD-10-CM | POA: Diagnosis not present

## 2014-09-30 DIAGNOSIS — M722 Plantar fascial fibromatosis: Secondary | ICD-10-CM | POA: Diagnosis not present

## 2014-09-30 NOTE — ED Notes (Signed)
Pt  Reports  l heel  Pain  X   approx  2  Months    -     denys  Any  Recent  Injury      -     Worse   Over    Last    Several  Days             Ambulated  To  The  Exam room

## 2014-09-30 NOTE — ED Provider Notes (Signed)
CSN: [id number]     Arrival date & time 09/30/14  0901 History   None    Chief Complaint  Patient presents with  . Foot Pain   (Consider location/radiation/quality/duration/timing/severity/associated sxs/prior Treatment) Patient is a 74 y.o. female presenting with lower extremity pain. The history is provided by the patient.  Foot Pain This is a chronic problem. The current episode started more than 1 week ago (76mos h/o pain.). The problem has been gradually worsening.    Past Medical History  Diagnosis Date  . Diabetes mellitus   . Hypertension    Past Surgical History  Procedure Laterality Date  . None    . Breast surgery    . Eye surgery     Family History  Problem Relation Age of Onset  . Diabetes    . Hypertension    . COPD    . Cancer - Other      breast   History  Substance Use Topics  . Smoking status: Never Smoker   . Smokeless tobacco: Not on file  . Alcohol Use: No   OB History    No data available     Review of Systems  Constitutional: Negative.   Musculoskeletal: Positive for gait problem.  Skin: Negative.     Allergies  Review of patient's allergies indicates no known allergies.  Home Medications   Prior to Admission medications   Medication Sig Start Date End Date Taking? Authorizing Provider  aspirin EC 81 MG tablet Take 1 tablet (81 mg total) by mouth daily. Patient not taking: Reported on 08/07/2014 06/03/14   Samuella Cota, MD  ferrous sulfate 325 (65 FE) MG tablet Take 1 tablet by mouth daily. 03/26/14   Historical Provider, MD  gabapentin (NEURONTIN) 100 MG capsule Take 100 mg by mouth 3 (three) times daily.    Historical Provider, MD  HYDROcodone-acetaminophen (NORCO/VICODIN) 5-325 MG per tablet Take 1 tablet by mouth every 6 (six) hours as needed for moderate pain.    Historical Provider, MD  losartan-hydrochlorothiazide (HYZAAR) 100-25 MG per tablet Take 1 tablet by mouth daily. 03/04/14   Historical Provider, MD   BP 151/83 mmHg   Pulse 83  Temp(Src) 97.8 F (36.6 C) (Oral)  Resp 12  SpO2 98% Physical Exam  Constitutional: She is oriented to person, place, and time. She appears well-developed and well-nourished.  Musculoskeletal: She exhibits tenderness.       Feet:  Neurological: She is alert and oriented to person, place, and time.  Skin: Skin is warm and dry.  Nursing note and vitals reviewed.   ED Course  Procedures (including critical care time) Labs Review Labs Reviewed - No data to display  Imaging Review Dg Os Calcis Left  09/30/2014   CLINICAL DATA:  Heel pain since November 2015, increased today.  EXAM: LEFT OS CALCIS - 2+ VIEW  COMPARISON:  None.  FINDINGS: No fracture. No bone lesion. Subtalar and calcaneocuboid articulations are well preserved.  Small to moderate-sized plantar calcaneal spur.  There is diffuse subcutaneous soft tissue edema, nonspecific.  IMPRESSION: No fracture or acute finding. No arthropathic changes. Small moderate plantar calcaneal spur.   Electronically Signed   By: Lajean Manes M.D.   On: 09/30/2014 09:50     MDM   1. Plantar fasciitis of left foot        Billy Fischer, MD 09/30/14 [phone]

## 2014-09-30 NOTE — Discharge Instructions (Signed)
Tuli"s heel cups, ice pack, no going barefoot, see podiatrist if further problems.

## 2014-10-26 ENCOUNTER — Ambulatory Visit (INDEPENDENT_AMBULATORY_CARE_PROVIDER_SITE_OTHER): Payer: Commercial Managed Care - HMO | Admitting: Podiatry

## 2014-10-26 ENCOUNTER — Encounter: Payer: Self-pay | Admitting: Podiatry

## 2014-10-26 VITALS — BP 159/92 | HR 78 | Resp 12 | Ht 63.0 in | Wt 180.0 lb

## 2014-10-26 DIAGNOSIS — M79672 Pain in left foot: Secondary | ICD-10-CM | POA: Diagnosis not present

## 2014-10-26 DIAGNOSIS — M722 Plantar fascial fibromatosis: Secondary | ICD-10-CM | POA: Diagnosis not present

## 2014-10-26 NOTE — Progress Notes (Addendum)
   Subjective:    Patient ID: [name], female    DOB: 12-Jun-1941, 74 y.o.   MRN: [id number]  HPI Comments: 74 year old female presents the office they with complaints of left heel pain. She states that she was seen at [facility] urgent care and x-rays were obtained and she was diagnosed with a heel spur. She states that she has pain in her left heel for the last 3-4 months. She denies any history of injury or trauma to the area or any change or increase in activity at the time of onset of symptoms. She's been soaking her feet Epson salts. She does work standing all day and she states that she has pain particularly after standing all day were in the mornings which is relieved by ambulation. She states that after she's been working all day and she sits down when she gets back up she has pain to her heel. She does not have pain at night. She states that she has chronic swelling to her left leg which is been ongoing for a long time. She is diabetic and she states that her last blood sugar was 99. She states it is never over 100. She denies any history of ulceration, tingling/numbness or any claudication symptoms. No other complaints at this time     Review of Systems  HENT: Positive for tinnitus.   Cardiovascular: Positive for leg swelling.  Musculoskeletal: Positive for back pain.  All other systems reviewed and are negative.      Objective:   Physical Exam  AAO x3, NAD DP/PT pulses palpable bilaterally, CRT less than 3 seconds Protective sensation intact with Simms Weinstein monofilament, vibratory sensation intact, Achilles tendon reflex intact Tenderness to palpation overlying the plantar medial tubercle of the calcaneus to left heel at the insertion of the plantar fascia. There is no pain along the course of plantar fascial within the arch of the foot. There is no pain with lateral compression of the calcaneus or pain the vibratory sensation. No pain on the posterior aspect of the  calcaneus or along the course/insertion of the Achilles tendon. There is no overlying edema, erythema, increase in warmth. No other areas of tenderness palpation or pain with vibratory sensation to the foot/ankle. MMT 5/5, ROM WNL No open lesions or pre-ulcerative lesions are identified. No pain with calf compression, swelling, warmth, erythema.        Assessment & Plan:  74 year old female with left heel pain, likely plantar fasciitis -X-rays from urgent care were reviewed.  -Treatment options were discussed the patient include alternatives, risks, complications. -Discussed possible steroid injection to the area however patient wishes to hold off until next appointment if symptoms continue. -Dispensed plantar fascial brace. -Discussed anti-inflammatory medications however patient does not want a prescription to proceed with OTC. -Discussed stretching exercises. -Ice to the area. -Discussed shoe gear modifications. Rectal and her not to go barefoot at home. Also discussed possible orthotics. Follow-up in 3 weeks or sooner if any problems are to arise. In the meantime, occurs call the office with any questions, concerns, changes symptoms.

## 2014-10-26 NOTE — Patient Instructions (Signed)
Plantar Fasciitis (Heel Spur Syndrome) with Rehab The plantar fascia is a fibrous, ligament-like, soft-tissue structure that spans the bottom of the foot. Plantar fasciitis is a condition that causes pain in the foot due to inflammation of the tissue. SYMPTOMS   Pain and tenderness on the underneath side of the foot.  Pain that worsens with standing or walking. CAUSES  Plantar fasciitis is caused by irritation and injury to the plantar fascia on the underneath side of the foot. Common mechanisms of injury include:  Direct trauma to bottom of the foot.  Damage to a small nerve that runs under the foot where the main fascia attaches to the heel bone.  Stress placed on the plantar fascia due to bone spurs. RISK INCREASES WITH:   Activities that place stress on the plantar fascia (running, jumping, pivoting, or cutting).  Poor strength and flexibility.  Improperly fitted shoes.  Tight calf muscles.  Flat feet.  Failure to warm-up properly before activity.  Obesity. PREVENTION  Warm up and stretch properly before activity.  Allow for adequate recovery between workouts.  Maintain physical fitness:  Strength, flexibility, and endurance.  Cardiovascular fitness.  Maintain a health body weight.  Avoid stress on the plantar fascia.  Wear properly fitted shoes, including arch supports for individuals who have flat feet. PROGNOSIS  If treated properly, then the symptoms of plantar fasciitis usually resolve without surgery. However, occasionally surgery is necessary. RELATED COMPLICATIONS   Recurrent symptoms that may result in a chronic condition.  Problems of the lower back that are caused by compensating for the injury, such as limping.  Pain or weakness of the foot during push-off following surgery.  Chronic inflammation, scarring, and partial or complete fascia tear, occurring more often from repeated injections. TREATMENT  Treatment initially involves the use of  ice and medication to help reduce pain and inflammation. The use of strengthening and stretching exercises may help reduce pain with activity, especially stretches of the Achilles tendon. These exercises may be performed at home or with a therapist. Your caregiver may recommend that you use heel cups of arch supports to help reduce stress on the plantar fascia. Occasionally, corticosteroid injections are given to reduce inflammation. If symptoms persist for greater than 6 months despite non-surgical (conservative), then surgery may be recommended.  MEDICATION   If pain medication is necessary, then nonsteroidal anti-inflammatory medications, such as aspirin and ibuprofen, or other minor pain relievers, such as acetaminophen, are often recommended.  Do not take pain medication within 7 days before surgery.  Prescription pain relievers may be given if deemed necessary by your caregiver. Use only as directed and only as much as you need.  Corticosteroid injections may be given by your caregiver. These injections should be reserved for the most serious cases, because they may only be given a certain number of times. HEAT AND COLD  Cold treatment (icing) relieves pain and reduces inflammation. Cold treatment should be applied for 10 to 15 minutes every 2 to 3 hours for inflammation and pain and immediately after any activity that aggravates your symptoms. Use ice packs or massage the area with a piece of ice (ice massage).  Heat treatment may be used prior to performing the stretching and strengthening activities prescribed by your caregiver, physical therapist, or athletic trainer. Use a heat pack or soak the injury in warm water. SEEK IMMEDIATE MEDICAL CARE IF:  Treatment seems to offer no benefit, or the condition worsens.  Any medications produce adverse side effects. EXERCISES RANGE   OF MOTION (ROM) AND STRETCHING EXERCISES - Plantar Fasciitis (Heel Spur Syndrome) These exercises may help you  when beginning to rehabilitate your injury. Your symptoms may resolve with or without further involvement from your physician, physical therapist or athletic trainer. While completing these exercises, remember:   Restoring tissue flexibility helps normal motion to return to the joints. This allows healthier, less painful movement and activity.  An effective stretch should be held for at least 30 seconds.  A stretch should never be painful. You should only feel a gentle lengthening or release in the stretched tissue. RANGE OF MOTION - Toe Extension, Flexion  Sit with your right / left leg crossed over your opposite knee.  Grasp your toes and gently pull them back toward the top of your foot. You should feel a stretch on the bottom of your toes and/or foot.  Hold this stretch for __________ seconds.  Now, gently pull your toes toward the bottom of your foot. You should feel a stretch on the top of your toes and or foot.  Hold this stretch for __________ seconds. Repeat __________ times. Complete this stretch __________ times per day.  RANGE OF MOTION - Ankle Dorsiflexion, Active Assisted  Remove shoes and sit on a chair that is preferably not on a carpeted surface.  Place right / left foot under knee. Extend your opposite leg for support.  Keeping your heel down, slide your right / left foot back toward the chair until you feel a stretch at your ankle or calf. If you do not feel a stretch, slide your bottom forward to the edge of the chair, while still keeping your heel down.  Hold this stretch for __________ seconds. Repeat __________ times. Complete this stretch __________ times per day.  STRETCH - Gastroc, Standing  Place hands on wall.  Extend right / left leg, keeping the front knee somewhat bent.  Slightly point your toes inward on your back foot.  Keeping your right / left heel on the floor and your knee straight, shift your weight toward the wall, not allowing your back to  arch.  You should feel a gentle stretch in the right / left calf. Hold this position for __________ seconds. Repeat __________ times. Complete this stretch __________ times per day. STRETCH - Soleus, Standing  Place hands on wall.  Extend right / left leg, keeping the other knee somewhat bent.  Slightly point your toes inward on your back foot.  Keep your right / left heel on the floor, bend your back knee, and slightly shift your weight over the back leg so that you feel a gentle stretch deep in your back calf.  Hold this position for __________ seconds. Repeat __________ times. Complete this stretch __________ times per day. STRETCH - Gastrocsoleus, Standing  Note: This exercise can place a lot of stress on your foot and ankle. Please complete this exercise only if specifically instructed by your caregiver.   Place the ball of your right / left foot on a step, keeping your other foot firmly on the same step.  Hold on to the wall or a rail for balance.  Slowly lift your other foot, allowing your body weight to press your heel down over the edge of the step.  You should feel a stretch in your right / left calf.  Hold this position for __________ seconds.  Repeat this exercise with a slight bend in your right / left knee. Repeat __________ times. Complete this stretch __________ times per day.    STRENGTHENING EXERCISES - Plantar Fasciitis (Heel Spur Syndrome)  These exercises may help you when beginning to rehabilitate your injury. They may resolve your symptoms with or without further involvement from your physician, physical therapist or athletic trainer. While completing these exercises, remember:   Muscles can gain both the endurance and the strength needed for everyday activities through controlled exercises.  Complete these exercises as instructed by your physician, physical therapist or athletic trainer. Progress the resistance and repetitions only as guided. STRENGTH -  Towel Curls  Sit in a chair positioned on a non-carpeted surface.  Place your foot on a towel, keeping your heel on the floor.  Pull the towel toward your heel by only curling your toes. Keep your heel on the floor.  If instructed by your physician, physical therapist or athletic trainer, add ____________________ at the end of the towel. Repeat __________ times. Complete this exercise __________ times per day. STRENGTH - Ankle Inversion  Secure one end of a rubber exercise band/tubing to a fixed object (table, pole). Loop the other end around your foot just before your toes.  Place your fists between your knees. This will focus your strengthening at your ankle.  Slowly, pull your big toe up and in, making sure the band/tubing is positioned to resist the entire motion.  Hold this position for __________ seconds.  Have your muscles resist the band/tubing as it slowly pulls your foot back to the starting position. Repeat __________ times. Complete this exercises __________ times per day.  Document Released: 08/17/2005 Document Revised: 11/09/2011 Document Reviewed: 11/29/2008 [employer] Patient Information 2015 [employer]. This information is not intended to replace advice given to you by your health care provider. Make sure you discuss any questions you have with your health care provider.  

## 2014-11-21 ENCOUNTER — Encounter: Payer: Self-pay | Admitting: Podiatry

## 2014-11-21 ENCOUNTER — Ambulatory Visit (INDEPENDENT_AMBULATORY_CARE_PROVIDER_SITE_OTHER): Payer: Commercial Managed Care - HMO | Admitting: Podiatry

## 2014-11-21 VITALS — BP 127/80 | HR 98

## 2014-11-21 DIAGNOSIS — M722 Plantar fascial fibromatosis: Secondary | ICD-10-CM

## 2014-11-21 MED ORDER — MELOXICAM 7.5 MG PO TABS: 7.5000 mg | ORAL_TABLET | Freq: Every day | ORAL | Status: DC

## 2014-11-21 MED ORDER — TRIAMCINOLONE ACETONIDE 10 MG/ML IJ SUSP
10.0000 mg | Freq: Once | INTRAMUSCULAR | Status: AC
  Administered 2014-11-21: 10 mg

## 2014-11-21 NOTE — Progress Notes (Signed)
   Subjective:    Patient ID: [name], female    DOB: 11-Aug-1941, 74 y.o.   MRN: [id number]  HPI 74 year old female presents the office they for follow-up evaluation of left heel pain, plantar fasciitis. She states that she is somewhat better since last appointment although she does continue to have pain to her foot. Denies any numbness or dealing. Denies any swelling or redness. She's been continuing to wear the plantar fascial brace as well as icing and stretching. No other complaints at this time. No acute changes since last appointment.  Review of Systems  All other systems reviewed and are negative.      Objective:   Physical Exam AAO x3, NAD DP/PT pulses palpable bilaterally, CRT less than 3 seconds Protective sensation intact with Simms Weinstein monofilament, vibratory sensation intact, Achilles tendon reflex intact There is continued tenderness to palpation overlying the plantar medial tubercle of the calcaneus to left heel at the insertion of the plantar fascia. There is no pain along the course of plantar fascial within the arch of the foot and the plantar fascia appears intact. There is no pain with lateral compression of the calcaneus or pain the vibratory sensation. No pain on the posterior aspect of the calcaneus or along the course/insertion of the Achilles tendon. There is no overlying edema, erythema, increase in warmth. No other areas of tenderness palpation or pain with vibratory sensation to the foot/ankle bilaterally. MMT 5/5, ROM WNL No open lesions or pre-ulcerative lesions are identified. No pain with calf compression, swelling, warmth, erythema.      Assessment & Plan:  75 year old female left heel pain, plantar fasciitis -Treatment options were discussed the patient living alternatives, risks, complications. -Patient elects to proceed with steroid injection into the left heel. Under sterile skin preparation, a total of 2.5cc of kenalog 10, 0.5% Marcaine  plain, and 2% lidocaine plain were infiltrated into the symptomatic area without complication. A band-aid was applied. Patient tolerated the injection well without complication. Post-injection care with discussed with the patient. Discussed with the patient to ice the area over the next couple of days to help prevent a steroid flare.  -Plantar fascial taping was applied. Once the tape is removed she can continue the plantar fascial brace. -Prescribed meloxicam. Discussed side effects the medication and directed to stop immediately should any occur and call the office. -Discussed shoe gear modifications and not to go barefoot. -Discussed orthotics. -Continue ice and stretching activities. -Follow-up in 3 weeks or sooner if any problems are to arise. In the meantime occurs to call the office with any questions, concerns, change in symptoms.

## 2014-12-12 ENCOUNTER — Encounter: Payer: Self-pay | Admitting: Podiatry

## 2014-12-12 ENCOUNTER — Ambulatory Visit (INDEPENDENT_AMBULATORY_CARE_PROVIDER_SITE_OTHER): Payer: Commercial Managed Care - HMO | Admitting: Podiatry

## 2014-12-12 VITALS — BP 123/50 | HR 94 | Resp 18

## 2014-12-12 DIAGNOSIS — M79672 Pain in left foot: Secondary | ICD-10-CM

## 2014-12-12 DIAGNOSIS — M722 Plantar fascial fibromatosis: Secondary | ICD-10-CM

## 2014-12-17 NOTE — Progress Notes (Signed)
Patient ID: [name], female   DOB: [birth year], 74 y.o.   MRN: [id number]  Subjective: 74 year old female presents the office they for follow-up evaluation left heel pain and plantar fasciitis. She states that she is much better and which she was previously. She states the injection as well as the taping helped significantly. She continues icing and stretching activities. No other complaints at this time in no acute changes since last appointment.  Objective: AAO x3, NAD DP/PT pulses palpable bilaterally, CRT less than 3 seconds Protective sensation intact with Simms Weinstein monofilament, vibratory sensation intact, Achilles tendon reflex intact There is significantly decreased tenderness to palpation overlying the plantar medial tubercle of the calcaneus to left heel at the insertion of the plantar fascia. There is no pain along the course of plantar fascial within the arch of the foot. There is no pain with lateral compression of the calcaneus or pain the vibratory sensation. No pain on the posterior aspect of the calcaneus or along the course/insertion of the Achilles tendon. There is no overlying edema, erythema, increase in warmth. No other areas of tenderness palpation or pain with vibratory sensation to the foot/ankle. MMT 5/5, ROM WNL No open lesions or pre-ulcerative lesions are identified. No pain with calf compression, swelling, warmth, erythema.  Assessment: 74 year old female with resolving left heel pain, plantar fasciitis  Plan: -Treatment options were discussed include alternatives, risks, complications -Discussed another steroid injection however since her symptoms have improved we'll hold off.  -Plantar fascial taping was applied  -Continue stretching activities and ice.  -Discussed shoe gear modifications and orthotics  -Follow-up as needed. In the meantime occur to call the office any questions or concerns or any changes symptoms.

## 2014-12-26 DIAGNOSIS — E6609 Other obesity due to excess calories: Secondary | ICD-10-CM | POA: Diagnosis not present

## 2014-12-26 DIAGNOSIS — Z6832 Body mass index (BMI) 32.0-32.9, adult: Secondary | ICD-10-CM | POA: Diagnosis not present

## 2014-12-26 DIAGNOSIS — M7702 Medial epicondylitis, left elbow: Secondary | ICD-10-CM | POA: Diagnosis not present

## 2015-02-04 DIAGNOSIS — Z6832 Body mass index (BMI) 32.0-32.9, adult: Secondary | ICD-10-CM | POA: Diagnosis not present

## 2015-02-04 DIAGNOSIS — E6609 Other obesity due to excess calories: Secondary | ICD-10-CM | POA: Diagnosis not present

## 2015-02-04 DIAGNOSIS — M5410 Radiculopathy, site unspecified: Secondary | ICD-10-CM | POA: Diagnosis not present

## 2015-02-04 DIAGNOSIS — M5136 Other intervertebral disc degeneration, lumbar region: Secondary | ICD-10-CM | POA: Diagnosis not present

## 2015-02-28 DIAGNOSIS — E119 Type 2 diabetes mellitus without complications: Secondary | ICD-10-CM | POA: Diagnosis not present

## 2015-04-04 ENCOUNTER — Other Ambulatory Visit (HOSPITAL_COMMUNITY): Payer: Self-pay | Admitting: Internal Medicine

## 2015-04-04 DIAGNOSIS — Z6832 Body mass index (BMI) 32.0-32.9, adult: Secondary | ICD-10-CM | POA: Diagnosis not present

## 2015-04-04 DIAGNOSIS — R600 Localized edema: Secondary | ICD-10-CM | POA: Diagnosis not present

## 2015-04-04 DIAGNOSIS — M810 Age-related osteoporosis without current pathological fracture: Secondary | ICD-10-CM

## 2015-04-04 DIAGNOSIS — E1129 Type 2 diabetes mellitus with other diabetic kidney complication: Secondary | ICD-10-CM | POA: Diagnosis not present

## 2015-04-04 DIAGNOSIS — E669 Obesity, unspecified: Secondary | ICD-10-CM | POA: Diagnosis not present

## 2015-04-04 DIAGNOSIS — E782 Mixed hyperlipidemia: Secondary | ICD-10-CM | POA: Diagnosis not present

## 2015-04-04 DIAGNOSIS — E6609 Other obesity due to excess calories: Secondary | ICD-10-CM | POA: Diagnosis not present

## 2015-04-04 DIAGNOSIS — Z1389 Encounter for screening for other disorder: Secondary | ICD-10-CM | POA: Diagnosis not present

## 2015-04-04 DIAGNOSIS — E1140 Type 2 diabetes mellitus with diabetic neuropathy, unspecified: Secondary | ICD-10-CM | POA: Diagnosis not present

## 2015-04-04 DIAGNOSIS — I872 Venous insufficiency (chronic) (peripheral): Secondary | ICD-10-CM | POA: Diagnosis not present

## 2015-04-10 ENCOUNTER — Ambulatory Visit (HOSPITAL_COMMUNITY)
Admission: RE | Admit: 2015-04-10 | Discharge: 2015-04-10 | Disposition: A | Discharge status: Home / Self Care | Payer: Commercial Managed Care - HMO | Source: Ambulatory Visit | Attending: Internal Medicine | Admitting: Internal Medicine

## 2015-04-10 DIAGNOSIS — Z780 Asymptomatic menopausal state: Secondary | ICD-10-CM | POA: Diagnosis not present

## 2015-04-10 DIAGNOSIS — M810 Age-related osteoporosis without current pathological fracture: Secondary | ICD-10-CM | POA: Diagnosis not present

## 2015-04-10 DIAGNOSIS — Z13820 Encounter for screening for osteoporosis: Secondary | ICD-10-CM | POA: Diagnosis not present

## 2015-04-11 DIAGNOSIS — E109 Type 1 diabetes mellitus without complications: Secondary | ICD-10-CM | POA: Diagnosis not present

## 2015-04-11 DIAGNOSIS — E1140 Type 2 diabetes mellitus with diabetic neuropathy, unspecified: Secondary | ICD-10-CM | POA: Diagnosis not present

## 2015-04-11 DIAGNOSIS — H5210 Myopia, unspecified eye: Secondary | ICD-10-CM | POA: Diagnosis not present

## 2015-04-11 DIAGNOSIS — I10 Essential (primary) hypertension: Secondary | ICD-10-CM | POA: Diagnosis not present

## 2015-04-17 DIAGNOSIS — Z6833 Body mass index (BMI) 33.0-33.9, adult: Secondary | ICD-10-CM | POA: Diagnosis not present

## 2015-04-17 DIAGNOSIS — Z1389 Encounter for screening for other disorder: Secondary | ICD-10-CM | POA: Diagnosis not present

## 2015-04-17 DIAGNOSIS — I10 Essential (primary) hypertension: Secondary | ICD-10-CM | POA: Diagnosis not present

## 2015-04-17 DIAGNOSIS — E1129 Type 2 diabetes mellitus with other diabetic kidney complication: Secondary | ICD-10-CM | POA: Diagnosis not present

## 2015-04-17 DIAGNOSIS — R600 Localized edema: Secondary | ICD-10-CM | POA: Diagnosis not present

## 2015-05-09 DIAGNOSIS — E6609 Other obesity due to excess calories: Secondary | ICD-10-CM | POA: Diagnosis not present

## 2015-05-09 DIAGNOSIS — I10 Essential (primary) hypertension: Secondary | ICD-10-CM | POA: Diagnosis not present

## 2015-05-09 DIAGNOSIS — E1140 Type 2 diabetes mellitus with diabetic neuropathy, unspecified: Secondary | ICD-10-CM | POA: Diagnosis not present

## 2015-05-09 DIAGNOSIS — I129 Hypertensive chronic kidney disease with stage 1 through stage 4 chronic kidney disease, or unspecified chronic kidney disease: Secondary | ICD-10-CM | POA: Diagnosis not present

## 2015-05-09 DIAGNOSIS — E1129 Type 2 diabetes mellitus with other diabetic kidney complication: Secondary | ICD-10-CM | POA: Diagnosis not present

## 2015-05-09 DIAGNOSIS — Z6834 Body mass index (BMI) 34.0-34.9, adult: Secondary | ICD-10-CM | POA: Diagnosis not present

## 2015-05-09 DIAGNOSIS — R600 Localized edema: Secondary | ICD-10-CM | POA: Diagnosis not present

## 2015-05-09 DIAGNOSIS — C50819 Malignant neoplasm of overlapping sites of unspecified female breast: Secondary | ICD-10-CM | POA: Diagnosis not present

## 2015-05-27 ENCOUNTER — Ambulatory Visit: Payer: Commercial Managed Care - HMO | Admitting: Podiatry

## 2015-06-14 DIAGNOSIS — I10 Essential (primary) hypertension: Secondary | ICD-10-CM | POA: Diagnosis not present

## 2015-06-14 DIAGNOSIS — R609 Edema, unspecified: Secondary | ICD-10-CM | POA: Diagnosis not present

## 2015-06-14 DIAGNOSIS — Z6832 Body mass index (BMI) 32.0-32.9, adult: Secondary | ICD-10-CM | POA: Diagnosis not present

## 2015-06-14 DIAGNOSIS — R7989 Other specified abnormal findings of blood chemistry: Secondary | ICD-10-CM | POA: Diagnosis not present

## 2015-06-14 DIAGNOSIS — E6609 Other obesity due to excess calories: Secondary | ICD-10-CM | POA: Diagnosis not present

## 2015-06-14 DIAGNOSIS — Z1389 Encounter for screening for other disorder: Secondary | ICD-10-CM | POA: Diagnosis not present

## 2015-06-20 DIAGNOSIS — E119 Type 2 diabetes mellitus without complications: Secondary | ICD-10-CM | POA: Diagnosis not present

## 2015-07-01 ENCOUNTER — Other Ambulatory Visit (HOSPITAL_COMMUNITY): Payer: Self-pay | Admitting: Internal Medicine

## 2015-07-05 DIAGNOSIS — R944 Abnormal results of kidney function studies: Secondary | ICD-10-CM | POA: Diagnosis not present

## 2015-07-08 ENCOUNTER — Other Ambulatory Visit (HOSPITAL_COMMUNITY): Payer: Self-pay | Admitting: Internal Medicine

## 2015-07-08 DIAGNOSIS — Z1231 Encounter for screening mammogram for malignant neoplasm of breast: Secondary | ICD-10-CM

## 2015-07-16 DIAGNOSIS — T887XXA Unspecified adverse effect of drug or medicament, initial encounter: Secondary | ICD-10-CM | POA: Diagnosis not present

## 2015-07-16 DIAGNOSIS — Z1389 Encounter for screening for other disorder: Secondary | ICD-10-CM | POA: Diagnosis not present

## 2015-07-16 DIAGNOSIS — E1129 Type 2 diabetes mellitus with other diabetic kidney complication: Secondary | ICD-10-CM | POA: Diagnosis not present

## 2015-07-16 DIAGNOSIS — R600 Localized edema: Secondary | ICD-10-CM | POA: Diagnosis not present

## 2015-07-16 DIAGNOSIS — D638 Anemia in other chronic diseases classified elsewhere: Secondary | ICD-10-CM | POA: Diagnosis not present

## 2015-07-16 DIAGNOSIS — R002 Palpitations: Secondary | ICD-10-CM | POA: Diagnosis not present

## 2015-07-16 DIAGNOSIS — I872 Venous insufficiency (chronic) (peripheral): Secondary | ICD-10-CM | POA: Diagnosis not present

## 2015-07-16 DIAGNOSIS — N183 Chronic kidney disease, stage 3 (moderate): Secondary | ICD-10-CM | POA: Diagnosis not present

## 2015-07-16 DIAGNOSIS — Z6833 Body mass index (BMI) 33.0-33.9, adult: Secondary | ICD-10-CM | POA: Diagnosis not present

## 2015-07-16 DIAGNOSIS — E6609 Other obesity due to excess calories: Secondary | ICD-10-CM | POA: Diagnosis not present

## 2015-07-18 ENCOUNTER — Ambulatory Visit (HOSPITAL_COMMUNITY)
Admission: RE | Admit: 2015-07-18 | Discharge: 2015-07-18 | Disposition: A | Discharge status: Home / Self Care | Payer: Commercial Managed Care - HMO | Source: Ambulatory Visit | Attending: Internal Medicine | Admitting: Internal Medicine

## 2015-07-18 DIAGNOSIS — Z1231 Encounter for screening mammogram for malignant neoplasm of breast: Secondary | ICD-10-CM | POA: Diagnosis not present

## 2015-09-12 DIAGNOSIS — R9431 Abnormal electrocardiogram [ECG] [EKG]: Secondary | ICD-10-CM | POA: Diagnosis not present

## 2015-09-12 DIAGNOSIS — R002 Palpitations: Secondary | ICD-10-CM | POA: Diagnosis not present

## 2015-09-12 DIAGNOSIS — R0602 Shortness of breath: Secondary | ICD-10-CM | POA: Diagnosis not present

## 2015-09-12 DIAGNOSIS — I10 Essential (primary) hypertension: Secondary | ICD-10-CM | POA: Diagnosis not present

## 2015-09-26 DIAGNOSIS — R0602 Shortness of breath: Secondary | ICD-10-CM | POA: Diagnosis not present

## 2015-09-26 DIAGNOSIS — R9431 Abnormal electrocardiogram [ECG] [EKG]: Secondary | ICD-10-CM | POA: Diagnosis not present

## 2015-09-26 DIAGNOSIS — R002 Palpitations: Secondary | ICD-10-CM | POA: Diagnosis not present

## 2015-09-26 DIAGNOSIS — I10 Essential (primary) hypertension: Secondary | ICD-10-CM | POA: Diagnosis not present

## 2015-11-01 DIAGNOSIS — Z1389 Encounter for screening for other disorder: Secondary | ICD-10-CM | POA: Diagnosis not present

## 2015-11-01 DIAGNOSIS — M1991 Primary osteoarthritis, unspecified site: Secondary | ICD-10-CM | POA: Diagnosis not present

## 2015-11-01 DIAGNOSIS — I10 Essential (primary) hypertension: Secondary | ICD-10-CM | POA: Diagnosis not present

## 2015-11-01 DIAGNOSIS — E6609 Other obesity due to excess calories: Secondary | ICD-10-CM | POA: Diagnosis not present

## 2015-11-01 DIAGNOSIS — Z6832 Body mass index (BMI) 32.0-32.9, adult: Secondary | ICD-10-CM | POA: Diagnosis not present

## 2015-11-01 DIAGNOSIS — R201 Hypoesthesia of skin: Secondary | ICD-10-CM | POA: Diagnosis not present

## 2015-11-01 DIAGNOSIS — E1140 Type 2 diabetes mellitus with diabetic neuropathy, unspecified: Secondary | ICD-10-CM | POA: Diagnosis not present

## 2015-11-01 DIAGNOSIS — E669 Obesity, unspecified: Secondary | ICD-10-CM | POA: Diagnosis not present

## 2015-11-29 DIAGNOSIS — G44209 Tension-type headache, unspecified, not intractable: Secondary | ICD-10-CM | POA: Diagnosis not present

## 2015-11-29 DIAGNOSIS — Z6832 Body mass index (BMI) 32.0-32.9, adult: Secondary | ICD-10-CM | POA: Diagnosis not present

## 2015-11-29 DIAGNOSIS — Z1389 Encounter for screening for other disorder: Secondary | ICD-10-CM | POA: Diagnosis not present

## 2015-11-29 DIAGNOSIS — E6609 Other obesity due to excess calories: Secondary | ICD-10-CM | POA: Diagnosis not present

## 2016-01-10 DIAGNOSIS — Z6833 Body mass index (BMI) 33.0-33.9, adult: Secondary | ICD-10-CM | POA: Diagnosis not present

## 2016-01-10 DIAGNOSIS — N182 Chronic kidney disease, stage 2 (mild): Secondary | ICD-10-CM | POA: Diagnosis not present

## 2016-01-10 DIAGNOSIS — M792 Neuralgia and neuritis, unspecified: Secondary | ICD-10-CM | POA: Diagnosis not present

## 2016-01-10 DIAGNOSIS — M7989 Other specified soft tissue disorders: Secondary | ICD-10-CM | POA: Diagnosis not present

## 2016-01-10 DIAGNOSIS — Z1389 Encounter for screening for other disorder: Secondary | ICD-10-CM | POA: Diagnosis not present

## 2016-01-23 DIAGNOSIS — E119 Type 2 diabetes mellitus without complications: Secondary | ICD-10-CM | POA: Diagnosis not present

## 2016-02-03 DIAGNOSIS — E119 Type 2 diabetes mellitus without complications: Secondary | ICD-10-CM | POA: Diagnosis not present

## 2016-02-03 DIAGNOSIS — Z6832 Body mass index (BMI) 32.0-32.9, adult: Secondary | ICD-10-CM | POA: Diagnosis not present

## 2016-02-03 DIAGNOSIS — R05 Cough: Secondary | ICD-10-CM | POA: Diagnosis not present

## 2016-02-03 DIAGNOSIS — J302 Other seasonal allergic rhinitis: Secondary | ICD-10-CM | POA: Diagnosis not present

## 2016-02-03 DIAGNOSIS — J209 Acute bronchitis, unspecified: Secondary | ICD-10-CM | POA: Diagnosis not present

## 2016-02-08 ENCOUNTER — Emergency Department (HOSPITAL_COMMUNITY)
Admission: EM | Admit: 2016-02-08 | Discharge: 2016-02-09 | Disposition: A | Discharge status: Home / Self Care | Payer: Commercial Managed Care - HMO | Attending: Emergency Medicine | Admitting: Emergency Medicine

## 2016-02-08 ENCOUNTER — Emergency Department (HOSPITAL_COMMUNITY): Payer: Commercial Managed Care - HMO

## 2016-02-08 ENCOUNTER — Encounter (HOSPITAL_COMMUNITY): Payer: Self-pay

## 2016-02-08 DIAGNOSIS — E119 Type 2 diabetes mellitus without complications: Secondary | ICD-10-CM | POA: Diagnosis not present

## 2016-02-08 DIAGNOSIS — R079 Chest pain, unspecified: Secondary | ICD-10-CM

## 2016-02-08 DIAGNOSIS — Z7982 Long term (current) use of aspirin: Secondary | ICD-10-CM | POA: Diagnosis not present

## 2016-02-08 DIAGNOSIS — I10 Essential (primary) hypertension: Secondary | ICD-10-CM | POA: Diagnosis not present

## 2016-02-08 DIAGNOSIS — Z79899 Other long term (current) drug therapy: Secondary | ICD-10-CM | POA: Diagnosis not present

## 2016-02-08 DIAGNOSIS — R0789 Other chest pain: Secondary | ICD-10-CM | POA: Insufficient documentation

## 2016-02-08 DIAGNOSIS — M79601 Pain in right arm: Secondary | ICD-10-CM | POA: Diagnosis not present

## 2016-02-08 LAB — BASIC METABOLIC PANEL
Anion gap: 7 (ref 5–15)
BUN: 22 mg/dL — ABNORMAL HIGH (ref 6–20)
CO2: 26 mmol/L (ref 22–32)
Calcium: 9 mg/dL (ref 8.9–10.3)
Chloride: 104 mmol/L (ref 101–111)
Creatinine, Ser: 1.3 mg/dL — ABNORMAL HIGH (ref 0.44–1.00)
GFR calc Af Amer: 45 mL/min — ABNORMAL LOW (ref >60)
GFR calc non Af Amer: 39 mL/min — ABNORMAL LOW (ref >60)
Glucose, Bld: 212 mg/dL — ABNORMAL HIGH (ref 65–99)
Potassium: 3.9 mmol/L (ref 3.5–5.1)
Sodium: 137 mmol/L (ref 135–145)

## 2016-02-08 LAB — CBC
HCT: 37 % (ref 36.0–46.0)
Hemoglobin: 12.3 g/dL (ref 12.0–15.0)
MCH: 29.4 pg (ref 26.0–34.0)
MCHC: 33.2 g/dL (ref 30.0–36.0)
MCV: 88.3 fL (ref 78.0–100.0)
Platelets: 282 10*3/uL (ref 150–400)
RBC: 4.19 MIL/uL (ref 3.87–5.11)
RDW: 13.1 % (ref 11.5–15.5)
WBC: 7.2 10*3/uL (ref 4.0–10.5)

## 2016-02-08 LAB — TROPONIN I: Troponin I: <0.03 ng/mL (ref <0.031)

## 2016-02-08 MED ORDER — IBUPROFEN 400 MG PO TABS
600.0000 mg | ORAL_TABLET | Freq: Once | ORAL | Status: AC
  Administered 2016-02-09: 600 mg via ORAL
  Filled 2016-02-08: qty 2

## 2016-02-08 NOTE — ED Notes (Signed)
Patient is complaining of chest pain and right arm pain.  Chest pain started a week ago and was seen at the MD office and they said she had "fluid"

## 2016-02-08 NOTE — ED Provider Notes (Signed)
CSN: [id number]     Arrival date & time 02/08/16  2156 History   By signing my name below, I, Hansel Feinstein, attest that this documentation has been prepared under the direction and in the presence of Jola Schmidt, MD. Electronically Signed: Hansel Feinstein, ED Scribe. 02/08/2016. 12:03 AM.    Chief Complaint  Patient presents with  . Chest Pain  . Arm Pain    The history is provided by the patient. No language interpreter was used.   HPI Comments: [name] is a 75 y.o. female with h/o HTN, DM who presents to the Emergency Department complaining of persistent, intermittent upper right arm discomfort onset a week ago and worsened tonight to be more frequent. Pt states that she developed a productive cough more than a week ago prior to onset of her arm pain, which was then followed by left upper chest discomfort. Per pt, she was seen by her PCP for these complaints and was given IM steroids, which provided no relief of her arm pain. Pt states that her cough and left upper chest discomfort have currently completely resolved. Pt notes that her chest discomfort and arm discomfort seemed to be unrelated and did not occur at the same time. She states that with onset of her arm pain, it lasts minutes at a time before completely resolving and is not accompanied by any additional symptoms. Pt states that her arm pain is worsened at night. Per pt, she has taken ASA with temporary relief of pain. She has not tried any additional OTC pain medications to alleviate symptoms. She denies recent falls, trauma, injury, heavy lifting, recent increased activity or exercise. Pt also notes she has had difficulty sleeping recently d/t the recent passing of her husband. She denies upper extremity weakness or numbness, SOB, nausea, rash.    Past Medical History  Diagnosis Date  . Diabetes mellitus   . Hypertension    Past Surgical History  Procedure Laterality Date  . None    . Breast surgery    . Eye surgery      Family History  Problem Relation Age of Onset  . Diabetes    . Hypertension    . COPD    . Cancer - Other      breast   Social History  Substance Use Topics  . Smoking status: Never Smoker   . Smokeless tobacco: None  . Alcohol Use: No   OB History    No data available     Review of Systems A complete 10 system review of systems was obtained and all systems are negative except as noted in the HPI and PMH.    Allergies  Review of patient's allergies indicates no known allergies.  Home Medications   Prior to Admission medications   Medication Sig Start Date End Date Taking? Authorizing Provider  aspirin EC 81 MG tablet Take 1 tablet (81 mg total) by mouth daily. 06/03/14  Yes Samuella Cota, MD  CARTIA XT 120 MG 24 hr capsule Take 120 mg by mouth daily. 11/26/15  Yes Historical Provider, MD  Menthol-Camphor (TIGER BALM ARTHRITIS RUB) 11-11 % CREA Apply 1 application topically daily as needed (for pain (back)).   Yes Historical Provider, MD   Pulse 86  Resp 15  SpO2 97% Physical Exam  Constitutional: She is oriented to person, place, and time. She appears well-developed and well-nourished. No distress.  HENT:  Head: Normocephalic and atraumatic.  Eyes: EOM are normal.  Neck: Normal range of  motion. Neck supple.  No C-spine tenderness.   Cardiovascular: Normal rate, regular rhythm and normal heart sounds.   Radial pulses 2+ and equal bilaterally.   Pulmonary/Chest: Effort normal and breath sounds normal.  Abdominal: Soft. She exhibits no distension. There is no tenderness.  Musculoskeletal: Normal range of motion. She exhibits no edema or tenderness.  Right arm non-tender with no edema, warmth or erythema. No unilateral arm swelling of the right compared to the left arm.   Neurological: She is alert and oriented to person, place, and time.  Skin: Skin is warm and dry.  Psychiatric: She has a normal mood and affect. Judgment normal.  Nursing note and vitals  reviewed.   ED Course  Procedures (including critical care time) DIAGNOSTIC STUDIES: Oxygen Saturation is 97% on RA, normal by my interpretation.    COORDINATION OF CARE: 11:59 PM Discussed treatment plan with pt at bedside which includes CXR, lab work and pt agreed to plan.   Labs Review Labs Reviewed  BASIC METABOLIC PANEL - Abnormal; Notable for the following:    Glucose, Bld 212 (*)    BUN 22 (*)    Creatinine, Ser 1.30 (*)    GFR calc non Af Amer 39 (*)    GFR calc Af Amer 45 (*)    All other components within normal limits  CBC  TROPONIN I    Imaging Review Dg Chest 2 View  02/08/2016  CLINICAL DATA:  75 year old female with chest pain EXAM: CHEST  2 VIEW COMPARISON:  Chest radiograph dated 06/03/2014 FINDINGS: Two views of the chest do not demonstrate focal consolidation. There is no pleural effusion or pneumothorax. The cardiac silhouette is within normal limits. There is degenerative changes of the spine. No acute fracture. IMPRESSION: No active cardiopulmonary disease. Electronically Signed   By: Anner Crete M.D.   On: 02/08/2016 23:57   I have personally reviewed and evaluated these images and lab results as part of my medical decision-making.   EKG Interpretation   Date/Time:  Saturday February 08 2016 23:22:34 EDT Ventricular Rate:  71 PR Interval:  197 QRS Duration: 92 QT Interval:  401 QTC Calculation: 436 R Axis:   -124 Text Interpretation:  Sinus or ectopic atrial rhythm Markedly posterior  QRS axis Nonspecific T abnormalities, lateral leads ST elevation, consider  inferior injury No significant change was found Confirmed by Chrisa Hassan  MD,  Yanna Leaks ([id number]) on 02/09/2016 12:53:33 AM      MDM   Final diagnoses:  Chest pain, unspecified chest pain type  Right arm pain    Patient is well-appearing. Atypical presentation. Doubt acute coronary syndrome. Doubt DVT of the right upper extent. Normal right radial pulse. Full range of motion of right shoulder,  right elbow, right wrist. No indication finished a workup. I do not think she needs a duplex of his right upper 70. I suspect this is more neuropathic related pain. Care follow-up. Home with anti-inflammatories. She understands to return to the ER for new or worsening symptoms  I personally performed the services described in this documentation, which was scribed in my presence. The recorded information has been reviewed and is accurate.       Jola Schmidt, MD 02/09/16 [phone]

## 2016-02-09 MED ORDER — IBUPROFEN 400 MG PO TABS: 400.0000 mg | ORAL_TABLET | Freq: Three times a day (TID) | ORAL | Status: DC | PRN

## 2016-02-21 DIAGNOSIS — E6609 Other obesity due to excess calories: Secondary | ICD-10-CM | POA: Diagnosis not present

## 2016-02-21 DIAGNOSIS — E669 Obesity, unspecified: Secondary | ICD-10-CM | POA: Diagnosis not present

## 2016-02-21 DIAGNOSIS — G4700 Insomnia, unspecified: Secondary | ICD-10-CM | POA: Diagnosis not present

## 2016-02-21 DIAGNOSIS — Z6833 Body mass index (BMI) 33.0-33.9, adult: Secondary | ICD-10-CM | POA: Diagnosis not present

## 2016-02-21 DIAGNOSIS — L905 Scar conditions and fibrosis of skin: Secondary | ICD-10-CM | POA: Diagnosis not present

## 2016-02-21 DIAGNOSIS — E119 Type 2 diabetes mellitus without complications: Secondary | ICD-10-CM | POA: Diagnosis not present

## 2016-02-27 DIAGNOSIS — H1089 Other conjunctivitis: Secondary | ICD-10-CM | POA: Diagnosis not present

## 2016-03-10 DIAGNOSIS — E119 Type 2 diabetes mellitus without complications: Secondary | ICD-10-CM | POA: Diagnosis not present

## 2016-03-10 DIAGNOSIS — E109 Type 1 diabetes mellitus without complications: Secondary | ICD-10-CM | POA: Diagnosis not present

## 2016-03-10 DIAGNOSIS — H5210 Myopia, unspecified eye: Secondary | ICD-10-CM | POA: Diagnosis not present

## 2016-03-10 DIAGNOSIS — Z0100 Encounter for examination of eyes and vision without abnormal findings: Secondary | ICD-10-CM | POA: Diagnosis not present

## 2016-04-16 DIAGNOSIS — E119 Type 2 diabetes mellitus without complications: Secondary | ICD-10-CM | POA: Diagnosis not present

## 2016-04-16 DIAGNOSIS — J0121 Acute recurrent ethmoidal sinusitis: Secondary | ICD-10-CM | POA: Diagnosis not present

## 2016-04-16 DIAGNOSIS — Z1389 Encounter for screening for other disorder: Secondary | ICD-10-CM | POA: Diagnosis not present

## 2016-04-16 DIAGNOSIS — I10 Essential (primary) hypertension: Secondary | ICD-10-CM | POA: Diagnosis not present

## 2016-04-16 DIAGNOSIS — Z6833 Body mass index (BMI) 33.0-33.9, adult: Secondary | ICD-10-CM | POA: Diagnosis not present

## 2016-05-14 DIAGNOSIS — E119 Type 2 diabetes mellitus without complications: Secondary | ICD-10-CM | POA: Diagnosis not present

## 2016-05-19 DIAGNOSIS — M779 Enthesopathy, unspecified: Secondary | ICD-10-CM | POA: Diagnosis not present

## 2016-05-19 DIAGNOSIS — I10 Essential (primary) hypertension: Secondary | ICD-10-CM | POA: Diagnosis not present

## 2016-05-19 DIAGNOSIS — N182 Chronic kidney disease, stage 2 (mild): Secondary | ICD-10-CM | POA: Diagnosis not present

## 2016-05-19 DIAGNOSIS — E1129 Type 2 diabetes mellitus with other diabetic kidney complication: Secondary | ICD-10-CM | POA: Diagnosis not present

## 2016-05-19 DIAGNOSIS — C50819 Malignant neoplasm of overlapping sites of unspecified female breast: Secondary | ICD-10-CM | POA: Diagnosis not present

## 2016-05-19 DIAGNOSIS — M1991 Primary osteoarthritis, unspecified site: Secondary | ICD-10-CM | POA: Diagnosis not present

## 2016-05-19 DIAGNOSIS — Z6833 Body mass index (BMI) 33.0-33.9, adult: Secondary | ICD-10-CM | POA: Diagnosis not present

## 2016-05-20 DIAGNOSIS — H16222 Keratoconjunctivitis sicca, not specified as Sjogren's, left eye: Secondary | ICD-10-CM | POA: Diagnosis not present

## 2016-05-20 DIAGNOSIS — H04122 Dry eye syndrome of left lacrimal gland: Secondary | ICD-10-CM | POA: Diagnosis not present

## 2016-08-07 ENCOUNTER — Other Ambulatory Visit (HOSPITAL_COMMUNITY): Payer: Self-pay | Admitting: Internal Medicine

## 2016-08-07 DIAGNOSIS — Z1231 Encounter for screening mammogram for malignant neoplasm of breast: Secondary | ICD-10-CM

## 2016-08-10 ENCOUNTER — Emergency Department (HOSPITAL_COMMUNITY): Payer: Commercial Managed Care - HMO

## 2016-08-10 ENCOUNTER — Ambulatory Visit (HOSPITAL_COMMUNITY)
Admission: RE | Admit: 2016-08-10 | Discharge: 2016-08-10 | Disposition: A | Discharge status: Home / Self Care | Payer: Commercial Managed Care - HMO | Source: Ambulatory Visit | Attending: Internal Medicine | Admitting: Internal Medicine

## 2016-08-10 ENCOUNTER — Other Ambulatory Visit (HOSPITAL_COMMUNITY): Payer: Self-pay | Admitting: Internal Medicine

## 2016-08-10 ENCOUNTER — Encounter (HOSPITAL_COMMUNITY): Payer: Self-pay | Admitting: Emergency Medicine

## 2016-08-10 ENCOUNTER — Emergency Department (HOSPITAL_COMMUNITY)
Admission: EM | Admit: 2016-08-10 | Discharge: 2016-08-10 | Disposition: A | Discharge status: Home / Self Care | Payer: Commercial Managed Care - HMO | Attending: Dermatology | Admitting: Dermatology

## 2016-08-10 DIAGNOSIS — Z7982 Long term (current) use of aspirin: Secondary | ICD-10-CM | POA: Diagnosis not present

## 2016-08-10 DIAGNOSIS — R071 Chest pain on breathing: Secondary | ICD-10-CM | POA: Insufficient documentation

## 2016-08-10 DIAGNOSIS — R0602 Shortness of breath: Secondary | ICD-10-CM | POA: Diagnosis not present

## 2016-08-10 DIAGNOSIS — Z1231 Encounter for screening mammogram for malignant neoplasm of breast: Secondary | ICD-10-CM | POA: Insufficient documentation

## 2016-08-10 DIAGNOSIS — R079 Chest pain, unspecified: Secondary | ICD-10-CM | POA: Diagnosis not present

## 2016-08-10 DIAGNOSIS — E119 Type 2 diabetes mellitus without complications: Secondary | ICD-10-CM | POA: Diagnosis not present

## 2016-08-10 DIAGNOSIS — I10 Essential (primary) hypertension: Secondary | ICD-10-CM | POA: Diagnosis not present

## 2016-08-10 DIAGNOSIS — R0789 Other chest pain: Secondary | ICD-10-CM | POA: Diagnosis not present

## 2016-08-10 DIAGNOSIS — M1991 Primary osteoarthritis, unspecified site: Secondary | ICD-10-CM | POA: Diagnosis not present

## 2016-08-10 DIAGNOSIS — Z1389 Encounter for screening for other disorder: Secondary | ICD-10-CM | POA: Diagnosis not present

## 2016-08-10 DIAGNOSIS — R062 Wheezing: Secondary | ICD-10-CM | POA: Diagnosis not present

## 2016-08-10 DIAGNOSIS — Z6833 Body mass index (BMI) 33.0-33.9, adult: Secondary | ICD-10-CM | POA: Diagnosis not present

## 2016-08-10 DIAGNOSIS — Z5321 Procedure and treatment not carried out due to patient leaving prior to being seen by health care provider: Secondary | ICD-10-CM | POA: Insufficient documentation

## 2016-08-10 NOTE — ED Triage Notes (Signed)
Pt reports wheezing x 1 week. Pt sent by Dr. Nolon Rod office.

## 2016-08-10 NOTE — ED Notes (Signed)
Per registration, patient's PCP sent order over for outpatient x-ray instead of ER visit. Patient had initially checked into ER stating she did not know what he wanted her to do. Patient to be dismissed in computer.

## 2016-08-12 ENCOUNTER — Ambulatory Visit (HOSPITAL_COMMUNITY): Payer: Commercial Managed Care - HMO

## 2016-08-13 ENCOUNTER — Other Ambulatory Visit: Payer: Self-pay | Admitting: Internal Medicine

## 2016-08-13 ENCOUNTER — Other Ambulatory Visit (HOSPITAL_COMMUNITY): Payer: Self-pay | Admitting: Internal Medicine

## 2016-08-13 DIAGNOSIS — R928 Other abnormal and inconclusive findings on diagnostic imaging of breast: Secondary | ICD-10-CM

## 2016-08-25 ENCOUNTER — Encounter (HOSPITAL_COMMUNITY): Payer: Commercial Managed Care - HMO

## 2016-08-25 DIAGNOSIS — E1140 Type 2 diabetes mellitus with diabetic neuropathy, unspecified: Secondary | ICD-10-CM | POA: Diagnosis not present

## 2016-08-27 DIAGNOSIS — B0052 Herpesviral keratitis: Secondary | ICD-10-CM | POA: Diagnosis not present

## 2016-09-01 DIAGNOSIS — E6609 Other obesity due to excess calories: Secondary | ICD-10-CM | POA: Diagnosis not present

## 2016-09-01 DIAGNOSIS — B0052 Herpesviral keratitis: Secondary | ICD-10-CM | POA: Diagnosis not present

## 2016-09-01 DIAGNOSIS — E1129 Type 2 diabetes mellitus with other diabetic kidney complication: Secondary | ICD-10-CM | POA: Diagnosis not present

## 2016-09-01 DIAGNOSIS — M5481 Occipital neuralgia: Secondary | ICD-10-CM | POA: Diagnosis not present

## 2016-09-01 DIAGNOSIS — M5412 Radiculopathy, cervical region: Secondary | ICD-10-CM | POA: Diagnosis not present

## 2016-09-01 DIAGNOSIS — I10 Essential (primary) hypertension: Secondary | ICD-10-CM | POA: Diagnosis not present

## 2016-09-01 DIAGNOSIS — Z1389 Encounter for screening for other disorder: Secondary | ICD-10-CM | POA: Diagnosis not present

## 2016-09-01 DIAGNOSIS — Z6833 Body mass index (BMI) 33.0-33.9, adult: Secondary | ICD-10-CM | POA: Diagnosis not present

## 2016-09-15 ENCOUNTER — Other Ambulatory Visit: Payer: Self-pay | Admitting: Internal Medicine

## 2016-09-15 ENCOUNTER — Ambulatory Visit (HOSPITAL_COMMUNITY)
Admission: RE | Admit: 2016-09-15 | Discharge: 2016-09-15 | Disposition: A | Discharge status: Home / Self Care | Payer: Medicare HMO | Source: Ambulatory Visit | Attending: Internal Medicine | Admitting: Internal Medicine

## 2016-09-15 ENCOUNTER — Other Ambulatory Visit (HOSPITAL_COMMUNITY): Payer: Self-pay | Admitting: Internal Medicine

## 2016-09-15 DIAGNOSIS — N6310 Unspecified lump in the right breast, unspecified quadrant: Secondary | ICD-10-CM | POA: Diagnosis not present

## 2016-09-15 DIAGNOSIS — R928 Other abnormal and inconclusive findings on diagnostic imaging of breast: Secondary | ICD-10-CM

## 2016-09-15 DIAGNOSIS — R921 Mammographic calcification found on diagnostic imaging of breast: Secondary | ICD-10-CM | POA: Insufficient documentation

## 2016-09-15 DIAGNOSIS — N6312 Unspecified lump in the right breast, upper inner quadrant: Secondary | ICD-10-CM | POA: Diagnosis not present

## 2016-09-18 ENCOUNTER — Other Ambulatory Visit (HOSPITAL_COMMUNITY): Payer: Self-pay | Admitting: Internal Medicine

## 2016-09-18 DIAGNOSIS — N6310 Unspecified lump in the right breast, unspecified quadrant: Secondary | ICD-10-CM

## 2016-09-29 ENCOUNTER — Ambulatory Visit (HOSPITAL_COMMUNITY)
Admission: RE | Admit: 2016-09-29 | Discharge: 2016-09-29 | Disposition: A | Discharge status: Home / Self Care | Payer: Medicare HMO | Source: Ambulatory Visit | Attending: Internal Medicine | Admitting: Internal Medicine

## 2016-09-29 ENCOUNTER — Encounter: Payer: Self-pay | Admitting: Cardiology

## 2016-09-29 ENCOUNTER — Encounter (HOSPITAL_COMMUNITY): Payer: Self-pay

## 2016-09-29 ENCOUNTER — Other Ambulatory Visit (HOSPITAL_COMMUNITY): Payer: Self-pay | Admitting: Internal Medicine

## 2016-09-29 DIAGNOSIS — C50211 Malignant neoplasm of upper-inner quadrant of right female breast: Secondary | ICD-10-CM | POA: Diagnosis not present

## 2016-09-29 DIAGNOSIS — N6310 Unspecified lump in the right breast, unspecified quadrant: Secondary | ICD-10-CM

## 2016-09-29 DIAGNOSIS — Z170 Estrogen receptor positive status [ER+]: Secondary | ICD-10-CM | POA: Diagnosis not present

## 2016-09-29 DIAGNOSIS — C50911 Malignant neoplasm of unspecified site of right female breast: Secondary | ICD-10-CM | POA: Insufficient documentation

## 2016-09-29 DIAGNOSIS — N6312 Unspecified lump in the right breast, upper inner quadrant: Secondary | ICD-10-CM | POA: Diagnosis not present

## 2016-09-29 MED ORDER — LIDOCAINE HCL (PF) 1 % IJ SOLN
INTRAMUSCULAR | Status: AC
  Filled 2016-09-29: qty 10

## 2016-09-29 MED ORDER — LIDOCAINE-EPINEPHRINE (PF) 1 %-1:200000 IJ SOLN
INTRAMUSCULAR | Status: AC
  Filled 2016-09-29: qty 30

## 2016-09-29 NOTE — Progress Notes (Signed)
Cardiology Office Note  Date: 09/30/2016   ID: [name], DOB [birth year], MRN [id number]  PCP: Glo Herring., MD  Primary Cardiologist: Rozann Lesches, MD   Chief Complaint  Patient presents with  . Chest Pain    History of Present Illness: [name] is a 76 y.o. female referred for cardiology consultation by Dr. Gerarda Fraction for the assessment of chest pain. She tells me that she was experiencing mild feeling of pressure in her chest after eating back in December. She took antacids and sometimes drank tea with improvement in symptoms. She does not report any exertional component to this, and states that the symptoms have settled down since then.  She also tells me that she recently had a right breast biopsy and has been diagnosed with cancer. She has a prior history of left-sided breast cancer status post radiation therapy. She has not yet moved to the next step in terms of treatment options.  I reviewed her medications which are outlined below and stable. She reports compliance.  ECG today shows sinus rhythm with anteroseptal Q's, leftward axis, nonspecific ST changes.  Past Medical History:  Diagnosis Date  . Breast cancer ([place])    Left  . CKD (chronic kidney disease) stage 2, GFR 60-89 ml/min   . Essential hypertension   . Hyperlipidemia   . Type 2 diabetes mellitus ([place])     Past Surgical History:  Procedure Laterality Date  . ABDOMINAL HYSTERECTOMY    . BREAST BIOPSY    . EYE SURGERY      Current Outpatient Prescriptions  Medication Sig Dispense Refill  . aspirin EC 81 MG tablet Take 1 tablet (81 mg total) by mouth daily.    Marland Kitchen CARTIA XT 120 MG 24 hr capsule Take 120 mg by mouth daily.  7  . ibuprofen (ADVIL,MOTRIN) 400 MG tablet Take 1 tablet (400 mg total) by mouth every 8 (eight) hours as needed. 12 tablet 0  . Menthol-Camphor (TIGER BALM ARTHRITIS RUB) 11-11 % CREA Apply 1 application topically daily as needed (for pain (back)).     No current  facility-administered medications for this visit.    Allergies:  Patient has no known allergies.   Social History: The patient  reports that she has never smoked. She has never used smokeless tobacco. She reports that she does not drink alcohol or use drugs.   Family History: The patient's family history includes Diabetes Mellitus II in her sister; Hypertension in her father and mother.   ROS:  Please see the history of present illness. Otherwise, complete review of systems is positive for none.  All other systems are reviewed and negative.   Physical Exam: VS:  BP 136/76 (BP Location: Right Arm)   Pulse 76   Ht 5\' 3"  (1.6 m)   Wt 190 lb (86.2 kg)   SpO2 98%   BMI 33.66 kg/m , BMI Body mass index is 33.66 kg/m.  Wt Readings from Last 3 Encounters:  09/30/16 190 lb (86.2 kg)  08/10/16 189 lb (85.7 kg)  10/26/14 180 lb (81.6 kg)    General: Overweight woman, appears comfortable at rest. HEENT: Conjunctiva and lids normal, oropharynx clear. Neck: Supple, no elevated JVP or carotid bruits, no thyromegaly. Lungs: Clear to auscultation, nonlabored breathing at rest. Cardiac: Regular rate and rhythm, no S3, 2/6 systolic murmur, no pericardial rub. Abdomen: Soft, nontender, bowel sounds present, no guarding or rebound. Extremities: No pitting edema, distal pulses 2+. Skin: Warm and dry. Musculoskeletal: No kyphosis. Neuropsychiatric: Alert  and oriented x3, affect grossly appropriate.  ECG: I personally reviewed the tracing from 02/08/2016 which showed probable limb lead reversal with sinus rhythm and nonspecific ST-T changes.  Recent Labwork: 02/08/2016: BUN 22; Creatinine, Ser 1.30; Hemoglobin 12.3; Platelets 282; Potassium 3.9; Sodium 137  August 2016: Cholesterol 241, HDL 62, LDL 141, triglycerides 188, AST 20, ALT 15, TSH 1.27 August 2016: CK 138, CK-MB 2.5, hemoglobin A1c 7.6  Other Studies Reviewed Today:  Echocardiogram 06/03/2014: Study Conclusions  - Left ventricle:  The cavity size was normal. Wall thickness was normal. Systolic function was normal. The estimated ejection fraction was in the range of 55% to 60%. Wall motion was normal; there were no regional wall motion abnormalities. Doppler parameters are consistent with abnormal left ventricular relaxation (grade 1 diastolic dysfunction). The E/e&' ratio is between 8-15, suggesting indeterminate LV filling pressure. - Aortic valve: Sclerosis without stenosis. There was no regurgitation. - Left atrium: Mildly dilated at 37 ml/m2.  Assessment and Plan:  1. Atypical chest pain, description seems more consistent with reflux. She states that this has improved in the last month. I reviewed her ECG which is abnormal but nonspecific overall. Plan to hold off on stress testing unless her symptoms escalate.  2. Cardiac murmur, echocardiogram will be obtained. She had aortic valve sclerosis documented back in 2015. This study will also help to exclude any potential wall motion abnormalities that might prompt further ischemic assessment.  3. Recent diagnosis of right-sided breast cancer per patient.  4. Essential hypertension, blood pressure is reasonably controlled today.  Current medicines were reviewed with the patient today.   Orders Placed This Encounter  Procedures  . EKG 12-Lead    Disposition: Call with test results.  Signed, Satira Sark, MD, [facility] 09/30/2016 1:36 PM    [place] [facility] [address], [place], [place] [address] Phone: [phone]; Fax: [phone]

## 2016-09-30 ENCOUNTER — Encounter: Payer: Self-pay | Admitting: Cardiology

## 2016-09-30 ENCOUNTER — Ambulatory Visit (INDEPENDENT_AMBULATORY_CARE_PROVIDER_SITE_OTHER): Payer: Medicare HMO | Admitting: Cardiology

## 2016-09-30 VITALS — BP 136/76 | HR 76 | Ht 63.0 in | Wt 190.0 lb

## 2016-09-30 DIAGNOSIS — R9431 Abnormal electrocardiogram [ECG] [EKG]: Secondary | ICD-10-CM

## 2016-09-30 DIAGNOSIS — R0789 Other chest pain: Secondary | ICD-10-CM

## 2016-09-30 DIAGNOSIS — I10 Essential (primary) hypertension: Secondary | ICD-10-CM

## 2016-09-30 DIAGNOSIS — R011 Cardiac murmur, unspecified: Secondary | ICD-10-CM

## 2016-09-30 NOTE — Patient Instructions (Signed)
Medication Instructions:  Your physician recommends that you continue on your current medications as directed. Please refer to the Current Medication list given to you today.   Labwork: none  Testing/Procedures: Your physician has requested that you have an echocardiogram. Echocardiography is a painless test that uses sound waves to create images of your heart. It provides your doctor with information about the size and shape of your heart and how well your heart's chambers and valves are working. This procedure takes approximately one hour. There are no restrictions for this procedure.    Follow-Up: Your physician recommends that you schedule a follow-up appointment in: To be determined ( will call with test results)    Any Other Special Instructions Will Be Listed Below (If Applicable).     If you need a refill on your cardiac medications before your next appointment, please call your pharmacy.

## 2016-10-06 ENCOUNTER — Ambulatory Visit (HOSPITAL_COMMUNITY)
Admission: RE | Admit: 2016-10-06 | Discharge: 2016-10-06 | Disposition: A | Discharge status: Home / Self Care | Payer: Medicare HMO | Source: Ambulatory Visit | Attending: Cardiology | Admitting: Cardiology

## 2016-10-06 DIAGNOSIS — I10 Essential (primary) hypertension: Secondary | ICD-10-CM | POA: Diagnosis not present

## 2016-10-06 DIAGNOSIS — R0789 Other chest pain: Secondary | ICD-10-CM | POA: Insufficient documentation

## 2016-10-06 DIAGNOSIS — R739 Hyperglycemia, unspecified: Secondary | ICD-10-CM | POA: Diagnosis not present

## 2016-10-06 LAB — ECHOCARDIOGRAM COMPLETE
E decel time: 401 msec
E/e' ratio: 6.32
FS: 32 % (ref 28–44)
IVS/LV PW RATIO, ED: 1.48
LA ID, A-P, ES: 25 mm
LA diam end sys: 25 mm
LA diam index: 1.25 cm/m2
LA vol A4C: 43.9 ml
LV E/e' medial: 6.32
LV E/e'average: 6.32
LV PW d: 10.1 mm — AB (ref 0.6–1.1)
LV dias vol index: 18 mL/m2
LV dias vol: 37 mL — AB (ref 46–106)
LV e' LATERAL: 7.83 cm/s
LV sys vol index: 6 mL/m2
LV sys vol: 13 mL — AB (ref 14–42)
LVOT SV: 87 mL
LVOT VTI: 30.8 cm
LVOT area: 2.84 cm2
LVOT diameter: 19 mm
LVOT peak grad rest: 10 mmHg
LVOT peak vel: 160 cm/s
Lateral S' vel: 10.3 cm/s
MV Dec: 401
MV pk A vel: 99.4 m/s
MV pk E vel: 49.5 m/s
RV sys press: 16 mmHg
Reg peak vel: 179 cm/s
Simpson's disk: 66
Stroke v: 24 ml
TAPSE: 20 mm
TDI e' lateral: 7.83
TDI e' medial: 5.33
TR max vel: 179 cm/s

## 2016-10-06 NOTE — Progress Notes (Signed)
*  PRELIMINARY RESULTS* Echocardiogram 2D Echocardiogram has been performed.  [name] 10/06/2016, 12:24 PM

## 2016-10-12 DIAGNOSIS — C50411 Malignant neoplasm of upper-outer quadrant of right female breast: Secondary | ICD-10-CM | POA: Diagnosis not present

## 2016-10-14 ENCOUNTER — Other Ambulatory Visit (HOSPITAL_COMMUNITY): Payer: Self-pay | Admitting: General Surgery

## 2016-10-14 DIAGNOSIS — C50911 Malignant neoplasm of unspecified site of right female breast: Secondary | ICD-10-CM

## 2016-10-16 NOTE — H&P (Signed)
  NTS SOAP Note  Vital Signs:  Vitals as of: 123XX123: Systolic [id number]: Diastolic 84: Heart Rate 85: Temp 97.79F (Temporal): Height 35ft 3in: Weight 194Lbs 0 Ounces: BMI 34.37   BMI : 34.37 kg/m2  Subjective: This 76 year old female presents for of right breast cancer.  Was found on routine mammography.  Referred by Dr. Gerarda Fraction.  Biopsy of two small areas in same quadrant of the right breast reveal invasive ductal carcinoma  No mass found by patient.  No family h/o breast carcinoma.  No nipple discharge noted.  Review of Symptoms:  Constitutional:negative Head:negative Eyes:negative Nose/Mouth/Throat:negative Cardiovascular:negative Respiratory:negative Gastrointestinnegative Genitourinary:negative Musculoskeletal:negative Skin:negative as above Hematolgic/Lymphatic:negative Allergic/Immunologic:negative   Past Medical History:Reviewed  Past Medical History  Surgical History: unremarkable Medical Problems: HTN Allergies: nkda Medications: cartia, baby asa   Social History:Reviewed  Social History  Preferred Language: English Race:  Black or African American Ethnicity: Not Hispanic / Latino Age: 65 year Marital Status:  S Alcohol: no   Smoking Status: Never smoker reviewed on 10/12/2016 Functional Status reviewed on 10/12/2016 ------------------------------------------------ Bathing: Normal Cooking: Normal Dressing: Normal Driving: Normal Eating: Normal Managing Meds: Normal Oral Care: Normal Shopping: Normal Toileting: Normal Transferring: Normal Walking: Normal Cognitive Status reviewed on 10/12/2016 ------------------------------------------------ Attention: Normal Decision Making: Normal Language: Normal Memory: Normal Motor: Normal Perception: Normal Problem Solving: Normal Visual and Spatial: Normal   Family History:Reviewed  Family Health History Family History is Unknown    Objective Information: General:Well  appearing, well nourished in no distress. Head:Atraumatic; no masses; no abnormalities Neck:Supple without lymphadenopathy.  Heart:RRR, no murmur or gallop.  Normal S1, S2.  No S3, S4.  Lungs:CTA bilaterally, no wheezes, rhonchi, rales.  Breathing unlabored. Large breasts, no dominant mass, nipple discharge, diimpling.  Biopsy site heaing well in upper, inner quadrant of the right breast. Dr. Nolon Rod notes, radiology notes, and path reports reviewed. Assessment:Right breast cancer  Diagnoses: 174.4  C50.411 Primary malignant neoplasm of upper outer quadrant of female breast (Malignant neoplasm of upper-outer quadrant of right female breast)  Procedures: [phone] - OFFICE OUTPATIENT NEW 30 MINUTES    Plan: As both lesions are in the same quadrant, patient would like to keep her breasts.  She knows she may need radiation therapy after surgery.  Would like to proceed with right partial mastectomy after needle localization, sentinel lymph node biopsy on 10/28/16.   Patient Education:Alternative treatments to surgery were discussed with patient (and family).Risks and benefits  of procedure including bleeding, infection, and right arm swelling were fully explained to the patient (and family) who gave informed consent. Patient/family questions were addressed.  Follow-up:Pending Surgery

## 2016-10-21 NOTE — Patient Instructions (Signed)
[name]  10/21/2016     @PREFPERIOPPHARMACY @   Your procedure is scheduled on  10/28/2016   Report to [facility] at  700  A.M.  Call this number if you have problems the morning of surgery:  [phone]   Remember:  Do not eat food or drink liquids after midnight.  Take these medicines the morning of surgery with A SIP OF WATER  diltiazem   Do not wear jewelry, make-up or nail polish.  Do not wear lotions, powders, or perfumes, or deoderant.  Do not shave 48 hours prior to surgery.  Men may shave face and neck.  Do not bring valuables to the hospital.  [facility] is not responsible for any belongings or valuables.  Contacts, dentures or bridgework may not be worn into surgery.  Leave your suitcase in the car.  After surgery it may be brought to your room.  For patients admitted to the hospital, discharge time will be determined by your treatment team.  Patients discharged the day of surgery will not be allowed to drive home.   Name and phone number of your driver:   family Special instructions:  None  Please read over the following fact sheets that you were given. Anesthesia Post-op Instructions and Care and Recovery After Surgery      Partial Mastectomy With or Without Axillary Lymph Node Removal, Care After Introduction Refer to this sheet in the next few weeks. These instructions provide you with information about caring for yourself after your procedure. Your health care provider may also give you more specific instructions. Your treatment has been planned according to current medical practices, but problems sometimes occur. Call your health care provider if you have any problems or questions after your procedure. What can I expect after the procedure? After your procedure, it is common to have:  Breast swelling.  Breast tenderness.  Stiffness in your arm or shoulder.  A change in the shape and feel of your breast. Follow these  instructions at home: Bathing  Take sponge baths until your health care provider says that you can start showering or bathing.  Do not take baths, swim, or use a hot tub until your health care provider approves. Incision care  There are many different ways to close and cover an incision, including stitches, skin glue, and adhesive strips. Follow your health care provider's instructions about:  Incision care.  Bandage (dressing) changes and removal.  Incision closure removal.  Check your incision area every day for signs of infection. Watch for:  Redness, swelling, or pain.  Fluid, blood, or pus.  If you were sent home with a surgical drain in place, follow your health care provider's instructions for emptying it. Activity  Return to your normal activities as directed by your health care provider.  Avoid strenuous exercise.  Be careful to avoid any activities that could cause an injury to your arm on the side of your surgery.  Do not lift anything that is heavier than 10 lb (4.5 kg). Avoid lifting with the arm that is on the side of your surgery.  Do not carry heavy objects on your shoulder.  After your drain is removed, you should perform exercises to keep your arm from getting stiff and swollen. Talk with your health care provider about which exercises are safe for you. General instructions  Take medicines only as directed by your health care provider.  Keep your dressing clean and  dry.  You may eat what you usually do.  Wear a supportive bra as directed by your health care provider.  Keep your arm elevated when at rest.  Do not wear tight jewelry on your arm, wrist, or fingers on the side of your surgery.  Get checked for extra fluid around your lymph nodes (lymphedema) as often as told by your health care provider.  If you had any lymph nodes removed during your procedure, be sure to tell all of your health care providers. This is important information to share  before you are involved in certain procedures, such as giving blood or having your blood pressure taken. Contact a health care provider if:  You have a fever.  Your pain medicine is not working.  Your swelling, weakness, or numbness in your arm has not improved after a few weeks.  You have new swelling in your breast or arm.  You have redness, swelling, or pain in your incision area.  You have fluid, blood, or pus coming from your incision. Get help right away if:  You have very bad pain in your breast or arm.  You have chest pain.  You have difficulty breathing. This information is not intended to replace advice given to you by your health care provider. Make sure you discuss any questions you have with your health care provider. Document Released: 03/31/2004 Document Revised: 04/23/2016 Document Reviewed: 05/02/2014  2017 Elsevier  Sentinel Lymph Node Biopsy in Breast Cancer Treatment Sentinel lymph node biopsy is a procedure to identify, remove, and examine one or more lymph nodes for cancer. Lymph nodes are collections of tissue that filter infections, cancer cells, and other waste substances from the bloodstream. Cancer can spread to nearby lymph nodes. It usually spreads to one lymph node first, and then it spreads to others. The first lymph node that the cancer could spread to is called the sentinel lymph node. In some cases, there may be more than one sentinel lymph node. If you have breast cancer, you may have this procedure to determine whether your cancer has spread. For breast cancer, a sentinel lymph node is usually in your armpit because that is where breast cancer tends to spread first. If no cancer is found in a sentinel lymph node, it is very unlikely that the cancer has spread to any of the other lymph nodes. If cancer is found in a sentinel lymph node, your surgeon may remove additional lymph nodes for examination. Tell a health care provider about:  Any allergies  you have.  All medicines you are taking, including vitamins, herbs, eye drops, creams, and over-the-counter medicines.  Any problems you or family members have had with anesthetic medicines.  Any blood disorders you have.  Any surgeries you have had.  Any medical conditions you have.  Whether you are pregnant or may be pregnant. What are the risks? Generally, this is a safe procedure. However, problems may occur, including:  Infection.  Allergic reactions to medicines or dyes.  Staining of the skin where the dye is injected.  Damaged lymph vessels, causing a buildup of fluid (lymphedema).  Damage to other structures or organs.  Pain, bleeding, or bruising where a lymph node was removed (biopsy site).  A false-negative biopsy. This is when cancer cells are not found in the sentinel node, but the cancer has spread to other nodes in the area. What happens before the procedure? Staying hydrated  Follow instructions from your health care provider about hydration, which may include:  Up to 2 hours before the procedure - you may continue to drink clear liquids, such as water, clear fruit juice, black coffee, and plain tea. Eating and drinking restrictions  Follow instructions from your health care provider about eating and drinking, which may include:  8 hours before the procedure - stop eating heavy meals or foods such as meat, fried foods, or fatty foods.  6 hours before the procedure - stop eating light meals or foods, such as toast or cereal.  6 hours before the procedure - stop drinking milk or drinks that contain milk.  2 hours before the procedure - stop drinking clear liquids. Medicines  Ask your health care provider about:  Changing or stopping your regular medicines. This is especially important if you are taking diabetes medicines or blood thinners.  Taking medicines such as aspirin and ibuprofen. These medicines can thin your blood. Do not take these medicines  before your procedure if your health care provider instructs you not to.  You may be given antibiotic medicine to help prevent infection. General instructions  If you smoke, stop smoking at least 2 weeks before the procedure. This will improve your health after the procedure and reduce your risk of getting a wound infection.  You may have blood tests to make sure your blood clots normally.  You may be screened for extra fluid around the lymph nodes (lymphedema).  Plan to have someone take you home from the hospital or clinic.  Ask your health care provider how your surgical site will be marked or identified. What happens during the procedure?  To reduce your risk of infection:  Your health care team will wash or sanitize their hands.  Your skin will be washed with soap.  You will be given one of the following:  A medicine to numb the area (local anesthetic).  A medicine to make you fall asleep (general anesthetic).  Blue dye or a radioactive substance or both will be injected around the tumor in your breast.  The blue dye will reach your lymph node quickly. It may be given just before surgery.  The radioactive substance will take longer to reach your lymph nodes, so it may be given before you go into the operating area.  Both the dye and the radioactive substance will follow the same path that a spreading cancer would be likely to follow.  If a radioactive substance was injected,a scanner will show where the substance has spread to help identify the sentinel lymph node.  The surgeon will make a small incision. If blue dye was injected, your surgeon will look for any lymph nodes that have picked up the dye.  Sentinel lymph nodes will be removed and sent to a lab for examination.  If no cancer is found, no other lymph nodes will be removed. This means it is unlikely that the cancer has spread to other lymph nodes.  If cancer is found, the surgeon will remove other lymph  nodes in the armpit for examination. This may happen during the same procedure or at a later time.  The incision will be closed with stitches (sutures) or metal clips.  Small adhesive bandages may be used to keep the skin edges close together.  A small dressing may be taped over the incision area. The procedure may vary among health care providers and hospitals. What happens after the procedure?  Your blood pressure, heart rate, breathing rate, and blood oxygen level will be monitored until the medicines you were given  have worn off.  Your urine may be blue for the next 24 hours. This is normal. It is caused by the dye that is used during the procedure.  Your skin at the injection site may be blue for up to 8 weeks.  You may feel numbness, tingling, or pain near your incision.  You may have swelling or bruising near your incision. Summary  Sentinel lymph node biopsy is a procedure to identify, remove, and examine one or more lymph nodes for cancer.  If you have breast cancer, you may have this procedure to determine whether your cancer has spread.  If cancer is found in a sentinel lymph node, your surgeon may remove additional lymph nodes for examination. This information is not intended to replace advice given to you by your health care provider. Make sure you discuss any questions you have with your health care provider. Document Released: 08/17/2005 Document Revised: 05/06/2016 Document Reviewed: 05/06/2016 Elsevier Interactive Patient Education  2017 [employer].  Sentinel Lymph Node Biopsy in Breast Cancer Treatment, Care After This sheet gives you information about how to care for yourself after your procedure. Your health care provider may also give you more specific instructions. If you have problems or questions, contact your health care provider. What can I expect after the procedure? After the procedure, it is common to have:  Blue-colored urine for the next 24 hours.  This is normal. It is caused by the dye that was used during the procedure.  Blue skin at the injection site for up to 8 weeks.  Numbness, tingling, or pain near your incision.  Swelling or bruising near your incision. Follow these instructions at home: Activity  Avoid activities that take a lot of effort (are strenuous).  Return to your normal activities as told by your health care provider. Ask your health care provider what activities are safe for you. Incision care  Follow instructions from your health care provider about how to take care of your incision. Make sure you:  Wash your hands with soap and water before you change your bandage (dressing). If soap and water are not available, use hand sanitizer.  Change your dressing as told by your health care provider.  Leave stitches (sutures), skin glue, or adhesive strips in place. These skin closures may need to stay in place for 2 weeks or longer. If adhesive strip edges start to loosen and curl up, you may trim the loose edges. Do not remove adhesive strips completely unless your health care provider tells you to do that.  Do not take baths, swim, or use a hot tub until your health care provider approves. Ask your health care provider if you can take showers. You may be able to shower 24 hours after your procedure.  Check your biopsy site every day for signs of infection. Check for:  More redness, swelling, or pain.  More fluid or blood.  Warmth.  Pus or a bad smell. General instructions  If you were given a surgical bra, wear it for the next 48 hours. You may remove the bra to shower.  Take over-the-counter and prescription medicines only as told by your health care provider.  You may resume your regular diet.  Do not have your blood pressure taken or have blood drawn from the arm on the side of the biopsy until your health care provider says it is okay.  You may need to be screened for extra fluid around the lymph  nodes (lymphedema). Follow instructions from  your health care provider about how often you should be checked.  Keep all follow-up visits as told by your health care provider. This is important. Contact a health care provider if:  You have more redness, swelling, or pain around your biopsy site.  You have more fluid or blood coming from your incision.  Your incision feels warm to the touch.  You have pus or a bad smell coming from your incision.  You have nausea and vomiting.  You have any new bruising.  You have chills or fever. Get help right away if:  You have pain that is getting worse, and your medicine is not helping.  You have vomiting that will not stop.  You have chest pain or trouble breathing. Summary  Follow instructions from your health care provider about how to take care of your incision.  Do not have your blood pressure taken or have blood drawn from the arm on the side of the biopsy until your health care provider says it is okay.  You may need to be screened for extra fluid around the lymph nodes (lymphedema). Follow instructions from your health care provider about how often you should be checked. This information is not intended to replace advice given to you by your health care provider. Make sure you discuss any questions you have with your health care provider. Document Released: 08/22/2013 Document Revised: 05/06/2016 Document Reviewed: 05/06/2016 Elsevier Interactive Patient Education  2017 [employer] PATIENT INSTRUCTIONS POST-ANESTHESIA  IMMEDIATELY FOLLOWING SURGERY:  Do not drive or operate machinery for the first twenty four hours after surgery.  Do not make any important decisions for twenty four hours after surgery or while taking narcotic pain medications or sedatives.  If you develop intractable nausea and vomiting or a severe headache please notify your doctor immediately.  FOLLOW-UP:  Please make an appointment with your surgeon as  instructed. You do not need to follow up with anesthesia unless specifically instructed to do so.  WOUND CARE INSTRUCTIONS (if applicable):  Keep a dry clean dressing on the anesthesia/puncture wound site if there is drainage.  Once the wound has quit draining you may leave it open to air.  Generally you should leave the bandage intact for twenty four hours unless there is drainage.  If the epidural site drains for more than 36-48 hours please call the anesthesia department.  QUESTIONS?:  Please feel free to call your physician or the hospital operator if you have any questions, and they will be happy to assist you.

## 2016-10-22 ENCOUNTER — Encounter (HOSPITAL_COMMUNITY): Payer: Self-pay

## 2016-10-22 ENCOUNTER — Ambulatory Visit (HOSPITAL_COMMUNITY)
Admission: RE | Admit: 2016-10-22 | Discharge: 2016-10-22 | Disposition: A | Discharge status: Home / Self Care | Payer: Medicare HMO | Source: Ambulatory Visit | Attending: General Surgery | Admitting: General Surgery

## 2016-10-22 ENCOUNTER — Encounter (HOSPITAL_COMMUNITY)
Admission: RE | Admit: 2016-10-22 | Discharge: 2016-10-22 | Disposition: A | Discharge status: Home / Self Care | Payer: Medicare HMO | Source: Ambulatory Visit | Attending: General Surgery | Admitting: General Surgery

## 2016-10-22 DIAGNOSIS — C50911 Malignant neoplasm of unspecified site of right female breast: Secondary | ICD-10-CM

## 2016-10-22 DIAGNOSIS — Z01812 Encounter for preprocedural laboratory examination: Secondary | ICD-10-CM | POA: Insufficient documentation

## 2016-10-22 DIAGNOSIS — Z01818 Encounter for other preprocedural examination: Secondary | ICD-10-CM | POA: Insufficient documentation

## 2016-10-22 LAB — CBC WITH DIFFERENTIAL/PLATELET
Basophils Absolute: 0 10*3/uL (ref 0.0–0.1)
Basophils Relative: 0 %
Eosinophils Absolute: 0.1 10*3/uL (ref 0.0–0.7)
Eosinophils Relative: 2 %
HCT: 35.9 % — ABNORMAL LOW (ref 36.0–46.0)
Hemoglobin: 11.7 g/dL — ABNORMAL LOW (ref 12.0–15.0)
Lymphocytes Relative: 26 %
Lymphs Abs: 1.5 10*3/uL (ref 0.7–4.0)
MCH: 29.5 pg (ref 26.0–34.0)
MCHC: 32.6 g/dL (ref 30.0–36.0)
MCV: 90.4 fL (ref 78.0–100.0)
Monocytes Absolute: 0.4 10*3/uL (ref 0.1–1.0)
Monocytes Relative: 7 %
Neutro Abs: 3.8 10*3/uL (ref 1.7–7.7)
Neutrophils Relative %: 65 %
Platelets: 257 10*3/uL (ref 150–400)
RBC: 3.97 MIL/uL (ref 3.87–5.11)
RDW: 13.8 % (ref 11.5–15.5)
WBC: 5.8 10*3/uL (ref 4.0–10.5)

## 2016-10-22 LAB — COMPREHENSIVE METABOLIC PANEL
ALT: 18 U/L (ref 14–54)
AST: 23 U/L (ref 15–41)
Albumin: 3.7 g/dL (ref 3.5–5.0)
Alkaline Phosphatase: 71 U/L (ref 38–126)
Anion gap: 9 (ref 5–15)
BUN: 14 mg/dL (ref 6–20)
CO2: 26 mmol/L (ref 22–32)
Calcium: 9.1 mg/dL (ref 8.9–10.3)
Chloride: 104 mmol/L (ref 101–111)
Creatinine, Ser: 1.18 mg/dL — ABNORMAL HIGH (ref 0.44–1.00)
GFR calc Af Amer: 51 mL/min — ABNORMAL LOW (ref >60)
GFR calc non Af Amer: 44 mL/min — ABNORMAL LOW (ref >60)
Glucose, Bld: 171 mg/dL — ABNORMAL HIGH (ref 65–99)
Potassium: 3.9 mmol/L (ref 3.5–5.1)
Sodium: 139 mmol/L (ref 135–145)
Total Bilirubin: 0.5 mg/dL (ref 0.3–1.2)
Total Protein: 7 g/dL (ref 6.5–8.1)

## 2016-10-28 ENCOUNTER — Ambulatory Visit (HOSPITAL_COMMUNITY): Payer: Medicare HMO | Admitting: Anesthesiology

## 2016-10-28 ENCOUNTER — Encounter (HOSPITAL_COMMUNITY)
Admission: RE | Admit: 2016-10-28 | Discharge: 2016-10-28 | Disposition: A | Discharge status: Home / Self Care | Payer: Medicare HMO | Source: Ambulatory Visit | Attending: General Surgery | Admitting: General Surgery

## 2016-10-28 ENCOUNTER — Encounter (HOSPITAL_COMMUNITY)
Admission: RE | Disposition: A | Discharge status: Home / Self Care | Payer: Self-pay | Source: Ambulatory Visit | Attending: General Surgery

## 2016-10-28 ENCOUNTER — Ambulatory Visit (HOSPITAL_COMMUNITY): Payer: Medicare HMO

## 2016-10-28 ENCOUNTER — Ambulatory Visit (HOSPITAL_COMMUNITY)
Admission: RE | Admit: 2016-10-28 | Discharge: 2016-10-28 | Disposition: A | Discharge status: Home / Self Care | Payer: Medicare HMO | Source: Ambulatory Visit | Attending: General Surgery | Admitting: General Surgery

## 2016-10-28 ENCOUNTER — Encounter (HOSPITAL_COMMUNITY): Payer: Self-pay | Admitting: *Deleted

## 2016-10-28 DIAGNOSIS — R229 Localized swelling, mass and lump, unspecified: Secondary | ICD-10-CM

## 2016-10-28 DIAGNOSIS — E1122 Type 2 diabetes mellitus with diabetic chronic kidney disease: Secondary | ICD-10-CM | POA: Insufficient documentation

## 2016-10-28 DIAGNOSIS — C50911 Malignant neoplasm of unspecified site of right female breast: Secondary | ICD-10-CM | POA: Diagnosis not present

## 2016-10-28 DIAGNOSIS — D0511 Intraductal carcinoma in situ of right breast: Secondary | ICD-10-CM | POA: Insufficient documentation

## 2016-10-28 DIAGNOSIS — Z7984 Long term (current) use of oral hypoglycemic drugs: Secondary | ICD-10-CM | POA: Diagnosis not present

## 2016-10-28 DIAGNOSIS — C50211 Malignant neoplasm of upper-inner quadrant of right female breast: Secondary | ICD-10-CM

## 2016-10-28 DIAGNOSIS — N182 Chronic kidney disease, stage 2 (mild): Secondary | ICD-10-CM | POA: Insufficient documentation

## 2016-10-28 DIAGNOSIS — I129 Hypertensive chronic kidney disease with stage 1 through stage 4 chronic kidney disease, or unspecified chronic kidney disease: Secondary | ICD-10-CM | POA: Insufficient documentation

## 2016-10-28 DIAGNOSIS — IMO0002 Reserved for concepts with insufficient information to code with codable children: Secondary | ICD-10-CM

## 2016-10-28 DIAGNOSIS — Z7982 Long term (current) use of aspirin: Secondary | ICD-10-CM | POA: Diagnosis not present

## 2016-10-28 DIAGNOSIS — R928 Other abnormal and inconclusive findings on diagnostic imaging of breast: Secondary | ICD-10-CM | POA: Diagnosis not present

## 2016-10-28 DIAGNOSIS — C50411 Malignant neoplasm of upper-outer quadrant of right female breast: Secondary | ICD-10-CM | POA: Diagnosis present

## 2016-10-28 HISTORY — PX: PARTIAL MASTECTOMY WITH NEEDLE LOCALIZATION AND AXILLARY SENTINEL LYMPH NODE BX: SHX6009

## 2016-10-28 LAB — GLUCOSE, CAPILLARY
Glucose-Capillary: 132 mg/dL — ABNORMAL HIGH (ref 65–99)
Glucose-Capillary: 136 mg/dL — ABNORMAL HIGH (ref 65–99)

## 2016-10-28 SURGERY — PARTIAL MASTECTOMY WITH NEEDLE LOCALIZATION AND AXILLARY SENTINEL LYMPH NODE BX
Anesthesia: General | Site: Breast | Laterality: Right

## 2016-10-28 MED ORDER — GLYCOPYRROLATE 0.2 MG/ML IJ SOLN
INTRAMUSCULAR | Status: AC
  Filled 2016-10-28: qty 2

## 2016-10-28 MED ORDER — LIDOCAINE HCL (PF) 1 % IJ SOLN
INTRAMUSCULAR | Status: AC
  Filled 2016-10-28: qty 5

## 2016-10-28 MED ORDER — HYDROCODONE-ACETAMINOPHEN 5-325 MG PO TABS: 1.0000 | ORAL_TABLET | Freq: Four times a day (QID) | ORAL | 0 refills | Status: DC | PRN

## 2016-10-28 MED ORDER — TECHNETIUM TC 99M SULFUR COLLOID FILTERED
0.5000 | Freq: Once | INTRAVENOUS | Status: AC | PRN
  Administered 2016-10-28: 0.5 via INTRADERMAL

## 2016-10-28 MED ORDER — ROCURONIUM 10MG/ML (10ML) SYRINGE FOR MEDFUSION PUMP - OPTIME
INTRAVENOUS | Status: DC | PRN
  Administered 2016-10-28: 35 mg via INTRAVENOUS

## 2016-10-28 MED ORDER — LABETALOL HCL 5 MG/ML IV SOLN
INTRAVENOUS | Status: AC
  Filled 2016-10-28: qty 4

## 2016-10-28 MED ORDER — FENTANYL CITRATE (PF) 250 MCG/5ML IJ SOLN
INTRAMUSCULAR | Status: AC
  Filled 2016-10-28: qty 5

## 2016-10-28 MED ORDER — CEFAZOLIN SODIUM-DEXTROSE 2-4 GM/100ML-% IV SOLN
2.0000 g | INTRAVENOUS | Status: AC
  Administered 2016-10-28: 2 g via INTRAVENOUS

## 2016-10-28 MED ORDER — FENTANYL CITRATE (PF) 100 MCG/2ML IJ SOLN
INTRAMUSCULAR | Status: DC | PRN
  Administered 2016-10-28 (×3): 50 ug via INTRAVENOUS

## 2016-10-28 MED ORDER — METHYLENE BLUE 0.5 % INJ SOLN
INTRAVENOUS | Status: AC
  Filled 2016-10-28: qty 10

## 2016-10-28 MED ORDER — CHLORHEXIDINE GLUCONATE CLOTH 2 % EX PADS: 6.0000 | MEDICATED_PAD | Freq: Once | CUTANEOUS | Status: DC

## 2016-10-28 MED ORDER — KETOROLAC TROMETHAMINE 30 MG/ML IJ SOLN
30.0000 mg | Freq: Once | INTRAMUSCULAR | Status: AC
  Administered 2016-10-28: 30 mg via INTRAVENOUS

## 2016-10-28 MED ORDER — SODIUM CHLORIDE 0.9 % IJ SOLN
INTRAMUSCULAR | Status: AC
  Filled 2016-10-28: qty 10

## 2016-10-28 MED ORDER — LIDOCAINE HCL (CARDIAC) 10 MG/ML IV SOLN
INTRAVENOUS | Status: DC | PRN
  Administered 2016-10-28: 40 mg via INTRAVENOUS

## 2016-10-28 MED ORDER — GLYCOPYRROLATE 0.2 MG/ML IJ SOLN
INTRAMUSCULAR | Status: DC | PRN
  Administered 2016-10-28: .3 mg via INTRAVENOUS

## 2016-10-28 MED ORDER — CEFAZOLIN SODIUM-DEXTROSE 2-4 GM/100ML-% IV SOLN
INTRAVENOUS | Status: AC
  Filled 2016-10-28: qty 100

## 2016-10-28 MED ORDER — PENTAFLUOROPROP-TETRAFLUOROETH EX AERO
INHALATION_SPRAY | CUTANEOUS | Status: AC
  Filled 2016-10-28: qty 103.5

## 2016-10-28 MED ORDER — BUPIVACAINE HCL (PF) 0.5 % IJ SOLN
INTRAMUSCULAR | Status: DC | PRN
  Administered 2016-10-28: 10 mL

## 2016-10-28 MED ORDER — LIDOCAINE HCL (PF) 2 % IJ SOLN
INTRAMUSCULAR | Status: AC
  Filled 2016-10-28: qty 10

## 2016-10-28 MED ORDER — CHLORHEXIDINE GLUCONATE CLOTH 2 % EX PADS
6.0000 | MEDICATED_PAD | Freq: Once | CUTANEOUS | Status: DC
Start: 2016-10-28 — End: 2016-10-28

## 2016-10-28 MED ORDER — BUPIVACAINE HCL (PF) 0.5 % IJ SOLN
INTRAMUSCULAR | Status: AC
  Filled 2016-10-28: qty 30

## 2016-10-28 MED ORDER — NEOSTIGMINE METHYLSULFATE 10 MG/10ML IV SOLN
INTRAVENOUS | Status: AC
  Filled 2016-10-28: qty 1

## 2016-10-28 MED ORDER — LACTATED RINGERS IV SOLN
INTRAVENOUS | Status: DC
  Administered 2016-10-28: 10:00:00 via INTRAVENOUS

## 2016-10-28 MED ORDER — FENTANYL CITRATE (PF) 100 MCG/2ML IJ SOLN
INTRAMUSCULAR | Status: AC
  Filled 2016-10-28: qty 2

## 2016-10-28 MED ORDER — KETOROLAC TROMETHAMINE 30 MG/ML IJ SOLN
INTRAMUSCULAR | Status: AC
  Filled 2016-10-28: qty 1

## 2016-10-28 MED ORDER — LABETALOL HCL 5 MG/ML IV SOLN
5.0000 mg | INTRAVENOUS | Status: DC | PRN
  Administered 2016-10-28: 5 mg via INTRAVENOUS

## 2016-10-28 MED ORDER — HYDROMORPHONE HCL 1 MG/ML IJ SOLN: 0.2500 mg | INTRAMUSCULAR | Status: DC | PRN

## 2016-10-28 MED ORDER — MIDAZOLAM HCL 2 MG/2ML IJ SOLN
1.0000 mg | INTRAMUSCULAR | Status: AC
  Administered 2016-10-28: 2 mg via INTRAVENOUS

## 2016-10-28 MED ORDER — ONDANSETRON HCL 4 MG/2ML IJ SOLN
4.0000 mg | Freq: Once | INTRAMUSCULAR | Status: AC
  Administered 2016-10-28: 4 mg via INTRAVENOUS
  Filled 2016-10-28: qty 2

## 2016-10-28 MED ORDER — SODIUM CHLORIDE 0.9 % IJ SOLN
INTRAVENOUS | Status: DC | PRN
  Administered 2016-10-28: 3 mL via INTRAMUSCULAR

## 2016-10-28 MED ORDER — MIDAZOLAM HCL 2 MG/2ML IJ SOLN
INTRAMUSCULAR | Status: AC
  Filled 2016-10-28: qty 2

## 2016-10-28 MED ORDER — NEOSTIGMINE METHYLSULFATE 10 MG/10ML IV SOLN
INTRAVENOUS | Status: DC | PRN
  Administered 2016-10-28: 1.5 mg via INTRAVENOUS

## 2016-10-28 MED ORDER — PROPOFOL 10 MG/ML IV BOLUS
INTRAVENOUS | Status: DC | PRN
  Administered 2016-10-28: 160 mg via INTRAVENOUS

## 2016-10-28 SURGICAL SUPPLY — 38 items
APPLIER CLIP 9.375 SM OPEN (CLIP) ×3
BAG HAMPER (MISCELLANEOUS) ×3 IMPLANT
BNDG CONFORM 6X.82 1P STRL (GAUZE/BANDAGES/DRESSINGS) ×3 IMPLANT
CLIP APPLIE 9.375 SM OPEN (CLIP) ×1 IMPLANT
CLOSURE WOUND 1/4 X3 (GAUZE/BANDAGES/DRESSINGS)
CLOTH BEACON ORANGE TIMEOUT ST (SAFETY) ×3 IMPLANT
CONT SPECI 4OZ STER CLIK (MISCELLANEOUS) ×3 IMPLANT
COVER LIGHT HANDLE STERIS (MISCELLANEOUS) ×6 IMPLANT
COVER PROBE W GEL 5X96 (DRAPES) ×3 IMPLANT
DECANTER SPIKE VIAL GLASS SM (MISCELLANEOUS) ×3 IMPLANT
DERMABOND ADVANCED (GAUZE/BANDAGES/DRESSINGS) ×2
DERMABOND ADVANCED .7 DNX12 (GAUZE/BANDAGES/DRESSINGS) ×1 IMPLANT
DURAPREP 26ML APPLICATOR (WOUND CARE) ×3 IMPLANT
ELECT REM PT RETURN 9FT ADLT (ELECTROSURGICAL) ×3
ELECTRODE REM PT RTRN 9FT ADLT (ELECTROSURGICAL) ×1 IMPLANT
GLOVE BIOGEL PI IND STRL 7.0 (GLOVE) ×1 IMPLANT
GLOVE BIOGEL PI INDICATOR 7.0 (GLOVE) ×2
GLOVE SURG SS PI 7.5 STRL IVOR (GLOVE) ×6 IMPLANT
GOWN STRL REUS W/TWL LRG LVL3 (GOWN DISPOSABLE) ×9 IMPLANT
KIT ROOM TURNOVER APOR (KITS) ×3 IMPLANT
MANIFOLD NEPTUNE II (INSTRUMENTS) ×3 IMPLANT
NEEDLE HYPO 18GX1.5 BLUNT FILL (NEEDLE) ×3 IMPLANT
NEEDLE HYPO 25X1 1.5 SAFETY (NEEDLE) ×3 IMPLANT
NS IRRIG 1000ML POUR BTL (IV SOLUTION) ×3 IMPLANT
PACK MINOR (CUSTOM PROCEDURE TRAY) ×3 IMPLANT
PAD ARMBOARD 7.5X6 YLW CONV (MISCELLANEOUS) ×3 IMPLANT
SET BASIN LINEN APH (SET/KITS/TRAYS/PACK) ×3 IMPLANT
SPONGE GAUZE 2X2 8PLY STER LF (GAUZE/BANDAGES/DRESSINGS)
SPONGE GAUZE 2X2 8PLY STRL LF (GAUZE/BANDAGES/DRESSINGS) IMPLANT
SPONGE LAP 18X18 X RAY DECT (DISPOSABLE) ×3 IMPLANT
STOCKINETTE IMPERVIOUS LG (DRAPES) ×3 IMPLANT
STRIP CLOSURE SKIN 1/4X3 (GAUZE/BANDAGES/DRESSINGS) IMPLANT
SUT SILK 0 FSL (SUTURE) ×3 IMPLANT
SUT SILK 2 0 SH (SUTURE) ×3 IMPLANT
SUT VIC AB 3-0 SH 27 (SUTURE) ×4
SUT VIC AB 3-0 SH 27X BRD (SUTURE) ×2 IMPLANT
SUT VIC AB 4-0 PS2 27 (SUTURE) ×6 IMPLANT
SYRINGE 10CC LL (SYRINGE) ×3 IMPLANT

## 2016-10-28 NOTE — Interval H&P Note (Signed)
History and Physical Interval Note:  10/28/2016 10:21 AM  [name]  has presented today for surgery, with the diagnosis of right breast cancer  The various methods of treatment have been discussed with the patient and family. After consideration of risks, benefits and other options for treatment, the patient has consented to  Procedure(s): PARTIAL MASTECTOMY WITH NEEDLE LOCALIZATION AND AXILLARY SENTINEL LYMPH NODE BX (Right) as a surgical intervention .  The patient's history has been reviewed, patient examined, no change in status, stable for surgery.  I have reviewed the patient's chart and labs.  Questions were answered to the patient's satisfaction.     Aviva Signs

## 2016-10-28 NOTE — Anesthesia Procedure Notes (Signed)
Procedure Name: Intubation Date/Time: 10/28/2016 10:55 AM Performed by: Tressie Stalker E Pre-anesthesia Checklist: Patient identified, Patient being monitored, Timeout performed, Emergency Drugs available and Suction available Patient Re-evaluated:Patient Re-evaluated prior to inductionOxygen Delivery Method: Circle system utilized Preoxygenation: Pre-oxygenation with 100% oxygen Intubation Type: IV induction Ventilation: Mask ventilation without difficulty Laryngoscope Size: Mac and 3 Grade View: Grade I Tube type: Oral Tube size: 7.0 mm Number of attempts: 1 Airway Equipment and Method: Stylet Placement Confirmation: ETT inserted through vocal cords under direct vision,  positive ETCO2 and breath sounds checked- equal and bilateral Secured at: 21 cm Tube secured with: Tape Dental Injury: Teeth and Oropharynx as per pre-operative assessment

## 2016-10-28 NOTE — Op Note (Signed)
Patient:  [name]  DOB:  April 14, 1941  MRN:  [id number]   Preop Diagnosis:  Right breast carcinoma  Postop Diagnosis:  Same  Procedure:  Right partial mastectomy after needle localization, sentinel lymph node biopsy  Surgeon:  Aviva Signs, M.D.  Anes:  Gen. endotracheal  Indications:  Patient is a 76 year old black female who has 2 small areas of invasive ductal carcinoma in the same quadrant of the right breast in close proximity to each other. The patient has elected to proceed with right partial mastectomy after needle localization and sentinel lymph node biopsy. Risks and benefits of the procedures including bleeding, infection, right arm swelling, and the possibility of needing further excision of the breast tissue were fully explained to the patient, who gave informed consent.  Procedure note:  The patient was placed in the supine position. The patient had already undergone radioactive nuclide injection as well as needle localization in radiology department. After induction of general endotracheal anesthesia, methylene blue and a dilute form was injected into the subpectoral region of the right breast at the site of the needle localization. It was massaged into the breast for 5 minutes. The breast and right axilla were prepped and draped using usual sterile technique with DuraPrep. Surgical site confirmation was performed.  Using the neoprobe, a sentinel lymph node biopsy was performed in the right axilla. 2 lymph nodes were found. These were blue. These were sent to pathology for further excision examination. Frozen section was negative for malignancy. The basin counts using the neoprobe were less than 10% of the in vivo counts. 0.5% Sensorcaine was instilled into the surrounding wound.  Any bleeding was controlled using small clips. The skin was closed using 4-0 Vicryl subcuticular suture.  Next, a right partial mastectomy was performed. A curvilinear incision was made along the  medial aspect of the areola. The dissection was taken down to an area just distal to the tip of the needle. A wide dissection was performed. A short suture was placed superiorly and a long suture placed laterally for orientation purposes. The breast tissue was removed and sent to the radiology Department for specimen radiography. The 2 clips were within the specimen removed. It was then sent to pathology for examination. The bleeding was controlled using Bovie electrocautery. The skin was reapproximated using a 4-0 Vicryl subcuticular suture. 0.5% Sensorcaine was instilled into the surrounding wound. Dermabond was applied to both wounds.  All tape and needle counts were correct at the end the procedure. The patient was extubated in the operating room and transferred to PACU in stable condition.  Complications:  None  EBL:  20 mL  Specimen:  Right sentinel lymph nodes of axilla, right breast tissue (short suture superior, long suture lateral)

## 2016-10-28 NOTE — Discharge Instructions (Signed)
Partial Mastectomy With or Without Axillary Lymph Node Removal, Care After °Refer to this sheet in the next few weeks. These instructions provide you with information about caring for yourself after your procedure. Your health care provider may also give you more specific instructions. Your treatment has been planned according to current medical practices, but problems sometimes occur. Call your health care provider if you have any problems or questions after your procedure. °What can I expect after the procedure? °After your procedure, it is common to have: °· Breast swelling. °· Breast tenderness. °· Stiffness in your arm or shoulder. °· A change in the shape and feel of your breast. °Follow these instructions at home: °Bathing  °· Take sponge baths until your health care provider says that you can start showering or bathing. °· Do not take baths, swim, or use a hot tub until your health care provider approves. °Incision care  °· There are many different ways to close and cover an incision, including stitches, skin glue, and adhesive strips. Follow your health care provider's instructions about: °¨ Incision care. °¨ Bandage (dressing) changes and removal. °¨ Incision closure removal. °· Check your incision area every day for signs of infection. Watch for: °¨ Redness, swelling, or pain. °¨ Fluid, blood, or pus. °· If you were sent home with a surgical drain in place, follow your health care provider's instructions for emptying it. °Activity  °· Return to your normal activities as directed by your health care provider. °· Avoid strenuous exercise. °· Be careful to avoid any activities that could cause an injury to your arm on the side of your surgery. °· Do not lift anything that is heavier than 10 lb (4.5 kg). Avoid lifting with the arm that is on the side of your surgery. °· Do not carry heavy objects on your shoulder. °· After your drain is removed, you should perform exercises to keep your arm from getting stiff  and swollen. Talk with your health care provider about which exercises are safe for you. °General instructions  °· Take medicines only as directed by your health care provider. °· Keep your dressing clean and dry. °· You may eat what you usually do. °· Wear a supportive bra as directed by your health care provider. °· Keep your arm elevated when at rest. °· Do not wear tight jewelry on your arm, wrist, or fingers on the side of your surgery. °· Get checked for extra fluid around your lymph nodes (lymphedema) as often as told by your health care provider. °· If you had any lymph nodes removed during your procedure, be sure to tell all of your health care providers. This is important information to share before you are involved in certain procedures, such as giving blood or having your blood pressure taken. °Contact a health care provider if: °· You have a fever. °· Your pain medicine is not working. °· Your swelling, weakness, or numbness in your arm has not improved after a few weeks. °· You have new swelling in your breast or arm. °· You have redness, swelling, or pain in your incision area. °· You have fluid, blood, or pus coming from your incision. °Get help right away if: °· You have very bad pain in your breast or arm. °· You have chest pain. °· You have difficulty breathing. °This information is not intended to replace advice given to you by your health care provider. Make sure you discuss any questions you have with your health care provider. °Document   Released: 03/31/2004 Document Revised: 04/23/2016 Document Reviewed: 05/02/2014 °Elsevier Interactive Patient Education © 2017 [employer] Inc[employer] ° °

## 2016-10-28 NOTE — Anesthesia Postprocedure Evaluation (Signed)
Anesthesia Post Note  Patient: [name]  Procedure(s) Performed: Procedure(s) (LRB): PARTIAL MASTECTOMY WITH NEEDLE LOCALIZATION AND AXILLARY SENTINEL LYMPH NODE BX (Right)  Patient location during evaluation: PACU Anesthesia Type: General Level of consciousness: awake and alert and oriented Pain management: pain level controlled Vital Signs Assessment: post-procedure vital signs reviewed and stable Respiratory status: spontaneous breathing Cardiovascular status: blood pressure returned to baseline Postop Assessment: no signs of nausea or vomiting Anesthetic complications: no     Last Vitals:  Vitals:   10/28/16 1345 10/28/16 1353  BP: (!) 152/81 (!) 186/86  Pulse: (!) 53 (!) 53  Resp: 11 18  Temp:  36.5 C    Last Pain:  Vitals:   10/28/16 0801  TempSrc: Oral                 Shayleigh Bouldin

## 2016-10-28 NOTE — Anesthesia Preprocedure Evaluation (Signed)
Anesthesia Evaluation  Patient identified by MRN, date of birth, ID band Patient awake    Reviewed: Allergy & Precautions, NPO status , Patient's Chart, lab work & pertinent test results  Airway Mallampati: II  TM Distance: >3 FB     Dental  (+) Edentulous Upper, Edentulous Lower   Pulmonary neg pulmonary ROS,    breath sounds clear to auscultation       Cardiovascular hypertension, Pt. on medications  Rhythm:Regular Rate:Normal     Neuro/Psych negative psych ROS   GI/Hepatic negative GI ROS,   Endo/Other  diabetes, Type 2, Oral Hypoglycemic Agents  Renal/GU Renal disease ( chronic kidney disease stage 2)     Musculoskeletal   Abdominal   Peds  Hematology   Anesthesia Other Findings   Reproductive/Obstetrics                             Anesthesia Physical Anesthesia Plan  ASA: II  Anesthesia Plan: General   Post-op Pain Management:    Induction: Intravenous  Airway Management Planned: Oral ETT  Additional Equipment:   Intra-op Plan:   Post-operative Plan: Extubation in OR  Informed Consent: I have reviewed the patients History and Physical, chart, labs and discussed the procedure including the risks, benefits and alternatives for the proposed anesthesia with the patient or authorized representative who has indicated his/her understanding and acceptance.     Plan Discussed with:   Anesthesia Plan Comments:         Anesthesia Quick Evaluation

## 2016-10-28 NOTE — OR Nursing (Signed)
Patient transported to <ammmo at 0800 via  Wheelchair.

## 2016-10-28 NOTE — Transfer of Care (Signed)
Immediate Anesthesia Transfer of Care Note  Patient: Peg Salminen  Procedure(s) Performed: Procedure(s): PARTIAL MASTECTOMY WITH NEEDLE LOCALIZATION AND AXILLARY SENTINEL LYMPH NODE BX (Right)  Patient Location: PACU  Anesthesia Type:General  Level of Consciousness: awake  Airway & Oxygen Therapy: Patient Spontanous Breathing and Patient connected to face mask oxygen  Post-op Assessment: Report given to RN  Post vital signs: Reviewed and stable  Last Vitals:  Vitals:   10/28/16 1000 10/28/16 1005  BP:    Pulse:    Resp: 19 20  Temp:      Last Pain:  Vitals:   10/28/16 0801  TempSrc: Oral      Patients Stated Pain Goal: 5 ([id number])  Complications: No apparent anesthesia complications

## 2016-10-29 ENCOUNTER — Encounter (HOSPITAL_COMMUNITY): Payer: Self-pay | Admitting: General Surgery

## 2016-11-01 ENCOUNTER — Telehealth: Payer: Self-pay | Admitting: General Surgery

## 2016-11-01 NOTE — Telephone Encounter (Signed)
Path report discussed with patient.

## 2016-11-04 ENCOUNTER — Encounter: Payer: Self-pay | Admitting: General Surgery

## 2016-11-04 ENCOUNTER — Ambulatory Visit (INDEPENDENT_AMBULATORY_CARE_PROVIDER_SITE_OTHER): Payer: Medicare HMO | Admitting: General Surgery

## 2016-11-04 VITALS — BP 178/89 | HR 94 | Temp 98.4°F | Ht 63.0 in | Wt 190.0 lb

## 2016-11-04 DIAGNOSIS — C50211 Malignant neoplasm of upper-inner quadrant of right female breast: Secondary | ICD-10-CM

## 2016-11-04 DIAGNOSIS — Z170 Estrogen receptor positive status [ER+]: Secondary | ICD-10-CM

## 2016-11-04 NOTE — Progress Notes (Signed)
Subjective:     [name] status post right partial mastectomy after needle localization, sentinel lymph node biopsy. She has done very well. She has no complaints.  Objective:    BP (!) 178/89   Pulse 94   Temp 98.4 F (36.9 C)   Ht '5\' 3"'  (1.6 m)   Wt 190 lb (86.2 kg)   BMI 33.66 kg/m   General:  alert, cooperative and no distress  Right breast incision healing well. Right axillary incision healing well.    Final path reveals a T1, N0, M0 invasive ductal carcinoma of right breast, ER-PR positive, HER-2 negative  Assessment:    Doing well postoperatively.    Plan:Gradually return to normal activity. Will be referred to oncology for further management treatment. All patient's questions were answered.

## 2016-11-25 ENCOUNTER — Ambulatory Visit (HOSPITAL_COMMUNITY): Payer: Medicare HMO

## 2016-11-25 DIAGNOSIS — E1140 Type 2 diabetes mellitus with diabetic neuropathy, unspecified: Secondary | ICD-10-CM | POA: Diagnosis not present

## 2016-12-07 ENCOUNTER — Encounter (HOSPITAL_COMMUNITY): Payer: Medicare HMO | Attending: Oncology | Admitting: Oncology

## 2016-12-07 ENCOUNTER — Encounter (HOSPITAL_COMMUNITY): Payer: Self-pay

## 2016-12-07 VITALS — BP 181/89 | HR 72 | Temp 98.6°F | Resp 16 | Ht 63.0 in | Wt 194.0 lb

## 2016-12-07 DIAGNOSIS — Z780 Asymptomatic menopausal state: Secondary | ICD-10-CM

## 2016-12-07 DIAGNOSIS — Z170 Estrogen receptor positive status [ER+]: Secondary | ICD-10-CM | POA: Diagnosis not present

## 2016-12-07 DIAGNOSIS — C50211 Malignant neoplasm of upper-inner quadrant of right female breast: Secondary | ICD-10-CM

## 2016-12-07 MED ORDER — ANASTROZOLE 1 MG PO TABS: 1.0000 mg | ORAL_TABLET | Freq: Every day | ORAL | 3 refills | Status: DC

## 2016-12-07 MED ORDER — CALCIUM CARBONATE-VITAMIN D 500-200 MG-UNIT PO TABS: 2.0000 | ORAL_TABLET | Freq: Every day | ORAL | 6 refills | Status: DC

## 2016-12-07 NOTE — Progress Notes (Signed)
[facility]  CONSULT NOTE  Patient Care Team: Redmond School, MD as PCP - General (Internal Medicine)  CHIEF COMPLAINTS/PURPOSE OF CONSULTATION:  Malignant neoplasm of upper-inner quadrant of right breast, ER/PR positive, Her2 negative, Stage 1  HISTORY OF PRESENTING ILLNESS:  [name] 76 y.o. female is here because of a recent diagnosis of right-sided breast cancer.   She underwent a routine screening bilateral mammogram on 08/10/16 which demonstrated possible masses in the right breast and calcifications in the left breast.   Bilateral diagnostic mammogram with [place] on 09/15/16 which demonstrated two small irregular hypoechoic masses in the 2 o'clock position of the right breast periareolar . Targeted ultrasound is performed, showing two irregular hypoechoic masses in the 2 o'clock position of the right breast approximately 1-3 cm from the nipple. The larger mass, centered at 1-2 cm from the nipple, measures 6 x 4 x 5 mm. The smaller mass centered at 3 cm from the nipple measures 6 x 3 x 4 mm. Benign dystrophic calcifications in the deep outer left breast, no malignancy.   She had a right breast biopsy on 09/29/16: 1. Breast, right, needle core biopsy, 2:00, 3 cm from nipple - INVASIVE DUCTAL CARCINOMA WITH EXTRACELLULAR MUCIN, SEE COMMENT. 2. Breast, right, needle core biopsy, 2:00, 1 cm from nipple - INVASIVE DUCTAL CARCINOMA WITH EXTRACELLULAR MUCIN, SEE COMMENT. Hormone profile: ER/PR +, HER2 negative.   On 10/28/16, the patient underwent partial mastectomy with axillary sentinel lymph node biopsy with Dr. Arnoldo Morale.  Surgical path: 1. Lymph node, sentinel, biopsy, right axillary - TWO BENIGN LYMPH NODES WITH NO TUMOR SEEN (0/2).  2. Breast, partial mastectomy, right breast tissue - MULTIFOCAL INVASIVE GRADE 1 DUCTAL CARCINOMA WITH ABUNDANT EXTRACELLULAR MUCIN. - TWO INVASIVE TUMOR FOCI MEASURING 0.7 CM AND 0.6 CM IN GREATEST DIMENSION. - ASSOCIATED INTERMEDIATE  GRADE DUCTAL CARCINOMA IN SITU. - MARGINS ARE NEGATIVE. -Hormone profile: ER/PR +, HER2 negative.    She reports she is healing well from this procedure. She reports occasional discomfort in the surgical area. Sh reports occasional headaches. She denies chest pain, shortness of breath except with exertion, labored breathing, or abdominal pain.  Of note, she has a prior history of left-sided breast cancer status post radiation therapy. She denies any immediate family history of cancer.   MEDICAL HISTORY:  Past Medical History:  Diagnosis Date  . Breast cancer ([place])   . CKD (chronic kidney disease) stage 2, GFR 60-89 ml/min   . Essential hypertension   . Hyperlipidemia   . Type 2 diabetes mellitus ([place])    diet controlled    SURGICAL HISTORY: Past Surgical History:  Procedure Laterality Date  . ABDOMINAL HYSTERECTOMY    . BREAST BIOPSY Left    benign  . EYE SURGERY Left    cataract removal  . PARTIAL MASTECTOMY WITH NEEDLE LOCALIZATION AND AXILLARY SENTINEL LYMPH NODE BX Right 10/28/2016   Procedure: PARTIAL MASTECTOMY WITH NEEDLE LOCALIZATION AND AXILLARY SENTINEL LYMPH NODE BX;  Surgeon: Aviva Signs, MD;  Location: [facility];  Service: General;  Laterality: Right;    SOCIAL HISTORY: Social History   Social History  . Marital status: Married    Spouse name: N/A  . Number of children: N/A  . Years of education: N/A   Occupational History  . Not on file.   Social History Main Topics  . Smoking status: Never Smoker  . Smokeless tobacco: Never Used  . Alcohol use No  . Drug use: No  . Sexual activity: Not  Currently    Birth control/ protection: Surgical   Other Topics Concern  . Not on file   Social History Narrative  . No narrative on file    FAMILY HISTORY: Family History  Problem Relation Age of Onset  . Hypertension Mother   . Hypertension Father   . Diabetes Mellitus II Sister   . Diabetes Mellitus II Brother   . Diabetes Mellitus II Brother      ALLERGIES:  has No Known Allergies.  MEDICATIONS:  Current Outpatient Prescriptions  Medication Sig Dispense Refill  . aspirin EC 81 MG tablet Take 1 tablet (81 mg total) by mouth daily.    Floria Raveling, Vaccinium myrtillus, (BILBERRY PO) Take 1,000 mg by mouth daily as needed (for eye health.).    Marland Kitchen diltiazem (TIAZAC) 120 MG 24 hr capsule Take 120 mg by mouth daily.  1  . HYDROcodone-acetaminophen (NORCO) 5-325 MG tablet Take 1 tablet by mouth every 6 (six) hours as needed for moderate pain. 30 tablet 0  . Menthol-Camphor (TIGER BALM ARTHRITIS RUB) 11-11 % CREA Apply 1 application topically 3 (three) times daily as needed (for pain (back)).      No current facility-administered medications for this visit.     Review of Systems  Constitutional: Negative.   HENT: Negative.   Eyes: Negative.   Respiratory: Positive for shortness of breath (With exertion only). Negative for wheezing.   Cardiovascular: Negative.  Negative for chest pain.  Gastrointestinal: Negative.  Negative for abdominal pain.  Genitourinary: Negative.   Musculoskeletal: Negative.        Reports occasional pain to surgical site.  Skin: Negative.   Neurological: Positive for headaches (Occasional).  Endo/Heme/Allergies: Negative.   Psychiatric/Behavioral: Negative.   All other systems reviewed and are negative.  14 point ROS was done and is otherwise as detailed above or in HPI   PHYSICAL EXAMINATION: ECOG PERFORMANCE STATUS: 0 - Asymptomatic  Vitals:   12/07/16 0836  BP: (!) 181/89  Pulse: 72  Resp: 16  Temp: 98.6 F (37 C)   Filed Weights   12/07/16 0836  Weight: 194 lb (88 kg)     Physical Exam  Constitutional: She is oriented to person, place, and time and well-developed, well-nourished, and in no distress.  HENT:  Head: Normocephalic and atraumatic.  Eyes: Conjunctivae and EOM are normal. Pupils are equal, round, and reactive to light.  Neck: Normal range of motion. Neck supple.   Cardiovascular: Normal rate, regular rhythm and normal heart sounds.   Pulmonary/Chest: Effort normal and breath sounds normal. Left breast exhibits no inverted nipple, no mass, no nipple discharge, no skin change and no tenderness.    Abdominal: Soft. Bowel sounds are normal.  Musculoskeletal: Normal range of motion.  Neurological: She is alert and oriented to person, place, and time. Gait normal.  Skin: Skin is warm and dry.  Nursing note and vitals reviewed.     LABORATORY DATA:  I have reviewed the data as listed. Lab Results  Component Value Date   WBC 5.8 10/22/2016   HGB 11.7 (L) 10/22/2016   HCT 35.9 (L) 10/22/2016   MCV 90.4 10/22/2016   PLT 257 10/22/2016   CMP     Component Value Date/Time   NA 139 10/22/2016 1505   K 3.9 10/22/2016 1505   CL 104 10/22/2016 1505   CO2 26 10/22/2016 1505   GLUCOSE 171 (H) 10/22/2016 1505   BUN 14 10/22/2016 1505   CREATININE 1.18 (H) 10/22/2016 1505   CALCIUM  9.1 10/22/2016 1505   PROT 7.0 10/22/2016 1505   ALBUMIN 3.7 10/22/2016 1505   AST 23 10/22/2016 1505   ALT 18 10/22/2016 1505   ALKPHOS 71 10/22/2016 1505   BILITOT 0.5 10/22/2016 1505   GFRNONAA 44 (L) 10/22/2016 1505   GFRAA 51 (L) 10/22/2016 1505     RADIOGRAPHIC STUDIES: I have personally reviewed the radiological images as listed and agreed with the findings in the report. No results found.  ASSESSMENT & PLAN:  Stage IA right breast IDC Pathologic Stage Classification (pTNM, AJCC 8th Edition): Primary Tumor (pT): pT1b(m). Regional Lymph Nodes (pN): pN0. Distant Metastases (pM): MX.  PLAN: -We discussed the patient's surgical pathology including her ER/PR positivity; the patient will be prescribed adjuvent endocrine therapy with Anastrozole to take for 5 years. We reviewed the side effects and benefits of this medication. - I suggested ordering Oncotype Dx testing for consideration of chemotherapy, given that the patient's tumor was greater than 0.5 cm  in size. The patient stated that she does not want any chemotherapy therefore we will forego ordering the Oncotype Dx. - There is no added benefit in adjuvant RT in patients over 74 years old with early stage ER/PR + HER2 negative breast cancers. - Baseline DEXA scan will be ordered today. - The patient will begin taking Calcium and Vitamin D supplements. I have prescribed this. -Repeat diagnostic mammogram in 6 months. - RTC in 1 month for evaluation of Anastrozole tolerance.    All questions were answered. The patient knows to call the clinic with any problems, questions or concerns.   This document serves as a record of services personally performed by Twana First, MD. It was created on her behalf by Maryla Morrow, a trained medical scribe. The creation of this record is based on the scribe's personal observations and the provider's statements to them. This document has been checked and approved by the attending provider.  This note was electronically signed.    Twana First, MD 12/07/2016 8:55 AM

## 2016-12-07 NOTE — Patient Instructions (Signed)
[place] at [facility] Discharge Instructions  RECOMMENDATIONS MADE BY THE CONSULTANT AND ANY TEST RESULTS WILL BE SENT TO YOUR REFERRING PHYSICIAN.   Return in 4 weeks with labs  You need to have a bone scan  Also.  Thank you for choosing [place] at [facility] to provide your oncology and hematology care.  To afford each patient quality time with our provider, please arrive at least 15 minutes before your scheduled appointment time.    If you have a lab appointment with the [facility] please come in thru the  Main Entrance and check in at the main information desk  You need to re-schedule your appointment should you arrive 10 or more minutes late.  We strive to give you quality time with our providers, and arriving late affects you and other patients whose appointments are after yours.  Also, if you no show three or more times for appointments you may be dismissed from the clinic at the providers discretion.     Again, thank you for choosing [facility].  Our hope is that these requests will decrease the amount of time that you wait before being seen by our physicians.       _____________________________________________________________  Should you have questions after your visit to [facility], please contact our office at [phone] between the hours of 8:30 a.m. and 4:30 p.m.  Voicemails left after 4:30 p.m. will not be returned until the following business day.  For prescription refill requests, have your pharmacy contact our office.       Resources For Cancer Patients and their Caregivers ? American Cancer Society: Can assist with transportation, wigs, general needs, runs Look Good Feel Better.        2150541777 ? Cancer Care: Provides financial assistance, online support groups, medication/co-pay assistance.  1-800-813-HOPE 747-765-0063) ? [place] Assists [place]  cancer patients and their families through emotional , educational and financial support.  534-877-6127 ? [place] DSS Where to apply for food stamps, Medicaid and utility assistance. 939-584-9037 ? [facility]: Transportation to medical appointments. 781-480-5838 ? Social Security Administration: May apply for disability if have a Stage IV cancer. 6473031043 (606)564-6906 ? [employer], Disability and Transit Services: Assists with nutrition, care and transit needs. [place] Support Programs: @10RELATIVEDAYS @ > Cancer Support Group  2nd Tuesday of the month 1pm-2pm, Journey Room  > Creative Journey  3rd Tuesday of the month 1130am-1pm, Journey Room  > Look Good Feel Better  1st Wednesday of the month 10am-12 noon, Journey Room (Call [place] to register [phone])

## 2016-12-21 ENCOUNTER — Other Ambulatory Visit (HOSPITAL_COMMUNITY): Payer: Medicare HMO

## 2016-12-30 ENCOUNTER — Encounter (HOSPITAL_COMMUNITY): Payer: Medicare HMO

## 2016-12-30 ENCOUNTER — Encounter (HOSPITAL_COMMUNITY): Payer: Medicare HMO | Attending: Oncology | Admitting: Oncology

## 2016-12-30 ENCOUNTER — Encounter (HOSPITAL_COMMUNITY): Payer: Self-pay

## 2016-12-30 VITALS — BP 170/87 | HR 79 | Temp 97.9°F | Resp 16 | Wt 190.1 lb

## 2016-12-30 DIAGNOSIS — Z170 Estrogen receptor positive status [ER+]: Principal | ICD-10-CM

## 2016-12-30 DIAGNOSIS — C50211 Malignant neoplasm of upper-inner quadrant of right female breast: Secondary | ICD-10-CM | POA: Insufficient documentation

## 2016-12-30 DIAGNOSIS — Z79811 Long term (current) use of aromatase inhibitors: Secondary | ICD-10-CM | POA: Diagnosis not present

## 2016-12-30 LAB — COMPREHENSIVE METABOLIC PANEL
ALT: 18 U/L (ref 14–54)
AST: 24 U/L (ref 15–41)
Albumin: 3.9 g/dL (ref 3.5–5.0)
Alkaline Phosphatase: 77 U/L (ref 38–126)
Anion gap: 9 (ref 5–15)
BUN: 17 mg/dL (ref 6–20)
CO2: 28 mmol/L (ref 22–32)
Calcium: 9.8 mg/dL (ref 8.9–10.3)
Chloride: 102 mmol/L (ref 101–111)
Creatinine, Ser: 1.28 mg/dL — ABNORMAL HIGH (ref 0.44–1.00)
GFR calc Af Amer: 46 mL/min — ABNORMAL LOW (ref >60)
GFR calc non Af Amer: 39 mL/min — ABNORMAL LOW (ref >60)
Glucose, Bld: 159 mg/dL — ABNORMAL HIGH (ref 65–99)
Potassium: 4.2 mmol/L (ref 3.5–5.1)
Sodium: 139 mmol/L (ref 135–145)
Total Bilirubin: 0.5 mg/dL (ref 0.3–1.2)
Total Protein: 7.7 g/dL (ref 6.5–8.1)

## 2016-12-30 LAB — CBC WITH DIFFERENTIAL/PLATELET
Basophils Absolute: 0 10*3/uL (ref 0.0–0.1)
Basophils Relative: 1 %
Eosinophils Absolute: 0.1 10*3/uL (ref 0.0–0.7)
Eosinophils Relative: 2 %
HCT: 38.3 % (ref 36.0–46.0)
Hemoglobin: 12.1 g/dL (ref 12.0–15.0)
Lymphocytes Relative: 23 %
Lymphs Abs: 1.4 10*3/uL (ref 0.7–4.0)
MCH: 28.7 pg (ref 26.0–34.0)
MCHC: 31.6 g/dL (ref 30.0–36.0)
MCV: 90.8 fL (ref 78.0–100.0)
Monocytes Absolute: 0.4 10*3/uL (ref 0.1–1.0)
Monocytes Relative: 6 %
Neutro Abs: 4.3 10*3/uL (ref 1.7–7.7)
Neutrophils Relative %: 68 %
Platelets: 276 10*3/uL (ref 150–400)
RBC: 4.22 MIL/uL (ref 3.87–5.11)
RDW: 12.5 % (ref 11.5–15.5)
WBC: 6.3 10*3/uL (ref 4.0–10.5)

## 2016-12-30 NOTE — Progress Notes (Signed)
[place]  FOLLOW UP NOTE  Patient Care Team: Redmond School, MD as PCP - General (Internal Medicine)  CHIEF COMPLAINTS/PURPOSE OF CONSULTATION:  Malignant neoplasm of upper-inner quadrant of right breast, ER/PR positive, Her2 negative, Stage 1  HISTORY OF PRESENTING ILLNESS:  [name] 76 y.o. female is here because of a recent diagnosis of right-sided breast cancer.   She underwent a routine screening bilateral mammogram on 08/10/16 which demonstrated possible masses in the right breast and calcifications in the left breast.   Bilateral diagnostic mammogram with [place] on 09/15/16 which demonstrated two small irregular hypoechoic masses in the 2 o'clock position of the right breast periareolar . Targeted ultrasound is performed, showing two irregular hypoechoic masses in the 2 o'clock position of the right breast approximately 1-3 cm from the nipple. The larger mass, centered at 1-2 cm from the nipple, measures 6 x 4 x 5 mm. The smaller mass centered at 3 cm from the nipple measures 6 x 3 x 4 mm. Benign dystrophic calcifications in the deep outer left breast, no malignancy.   She had a right breast biopsy on 09/29/16: 1. Breast, right, needle core biopsy, 2:00, 3 cm from nipple - INVASIVE DUCTAL CARCINOMA WITH EXTRACELLULAR MUCIN, SEE COMMENT. 2. Breast, right, needle core biopsy, 2:00, 1 cm from nipple - INVASIVE DUCTAL CARCINOMA WITH EXTRACELLULAR MUCIN, SEE COMMENT. Hormone profile: ER/PR +, HER2 negative.   On 10/28/16, the patient underwent partial mastectomy with axillary sentinel lymph node biopsy with Dr. Arnoldo Morale.  Surgical path: 1. Lymph node, sentinel, biopsy, right axillary - TWO BENIGN LYMPH NODES WITH NO TUMOR SEEN (0/2).  2. Breast, partial mastectomy, right breast tissue - MULTIFOCAL INVASIVE GRADE 1 DUCTAL CARCINOMA WITH ABUNDANT EXTRACELLULAR MUCIN. - TWO INVASIVE TUMOR FOCI MEASURING 0.7 CM AND 0.6 CM IN GREATEST DIMENSION. - ASSOCIATED  INTERMEDIATE GRADE DUCTAL CARCINOMA IN SITU. - MARGINS ARE NEGATIVE. -Hormone profile: ER/PR +, HER2 negative.    She reports she is healing well from this procedure. She reports occasional discomfort in the surgical area. Sh reports occasional headaches. She denies chest pain, shortness of breath except with exertion, labored breathing, or abdominal pain.  Of note, she has a prior history of left-sided breast cancer status post radiation therapy. She denies any immediate family history of cancer.  INTERVAL HISTORY: The patient returns to the clinic today for routine follow up. She is tolerating anastrozole well without any side effects including hot flashes, joint pains, or fatigue. She continues to take her calcium-vitamin D. She has not yet had her baseline DEXA scan.  She has been doing well except that she woke up this morning and her right eye was red. She denies any discharge. She states it is a little painful.  She also has been having some ankle swelling the past few days. Otherwise she has no complaints.   MEDICAL HISTORY:  Past Medical History:  Diagnosis Date  . Breast cancer ([place])   . CKD (chronic kidney disease) stage 2, GFR 60-89 ml/min   . Essential hypertension   . Hyperlipidemia   . Type 2 diabetes mellitus ([place])    diet controlled    SURGICAL HISTORY: Past Surgical History:  Procedure Laterality Date  . ABDOMINAL HYSTERECTOMY    . BREAST BIOPSY Left    benign  . EYE SURGERY Left    cataract removal  . PARTIAL MASTECTOMY WITH NEEDLE LOCALIZATION AND AXILLARY SENTINEL LYMPH NODE BX Right 10/28/2016   Procedure: PARTIAL MASTECTOMY WITH NEEDLE LOCALIZATION AND AXILLARY  SENTINEL LYMPH NODE BX;  Surgeon: Aviva Signs, MD;  Location: [facility];  Service: General;  Laterality: Right;    SOCIAL HISTORY: Social History   Social History  . Marital status: Married    Spouse name: N/A  . Number of children: N/A  . Years of education: N/A   Occupational History  . Not  on file.   Social History Main Topics  . Smoking status: Never Smoker  . Smokeless tobacco: Never Used  . Alcohol use No  . Drug use: No  . Sexual activity: Not Currently    Birth control/ protection: Surgical   Other Topics Concern  . Not on file   Social History Narrative  . No narrative on file    FAMILY HISTORY: Family History  Problem Relation Age of Onset  . Hypertension Mother   . Hypertension Father   . Diabetes Mellitus II Sister   . Diabetes Mellitus II Brother   . Diabetes Mellitus II Brother     ALLERGIES:  has No Known Allergies.  MEDICATIONS:  Current Outpatient Prescriptions  Medication Sig Dispense Refill  . anastrozole (ARIMIDEX) 1 MG tablet Take 1 tablet (1 mg total) by mouth daily. 90 tablet 3  . aspirin EC 81 MG tablet Take 1 tablet (81 mg total) by mouth daily.    Floria Raveling, Vaccinium myrtillus, (BILBERRY PO) Take 1,000 mg by mouth daily as needed (for eye health.).    Marland Kitchen calcium-vitamin D (OSCAL WITH D) 500-200 MG-UNIT tablet Take 2 tablets by mouth daily with breakfast. 60 tablet 6  . diltiazem (TIAZAC) 120 MG 24 hr capsule Take 120 mg by mouth daily.  1  . HYDROcodone-acetaminophen (NORCO) 5-325 MG tablet Take 1 tablet by mouth every 6 (six) hours as needed for moderate pain. 30 tablet 0  . Menthol-Camphor (TIGER BALM ARTHRITIS RUB) 11-11 % CREA Apply 1 application topically 3 (three) times daily as needed (for pain (back)).      No current facility-administered medications for this visit.     Review of Systems  Constitutional: Negative.   HENT: Negative.        Right eye redness and pain  Eyes: Negative.   Respiratory: Negative for shortness of breath (With exertion only) and wheezing.   Cardiovascular: Negative.  Negative for chest pain.       Ankle edema  Gastrointestinal: Negative.  Negative for abdominal pain.  Genitourinary: Negative.   Musculoskeletal: Negative.   Skin: Negative.   Neurological: Negative.   Endo/Heme/Allergies:  Negative.   Psychiatric/Behavioral: Negative.   All other systems reviewed and are negative.  14 point ROS was done and is otherwise as detailed above or in HPI   PHYSICAL EXAMINATION: ECOG PERFORMANCE STATUS: 0 - Asymptomatic  Vitals:   12/30/16 1451  BP: (!) 170/87  Pulse: 79  Resp: 16  Temp: 97.9 F (36.6 C)     Physical Exam  Constitutional: She is oriented to person, place, and time and well-developed, well-nourished, and in no distress.  HENT:  Head: Normocephalic and atraumatic.  Right eye erythema, no discharge.  Eyes: Conjunctivae and EOM are normal. Pupils are equal, round, and reactive to light.  Neck: Normal range of motion. Neck supple.  Cardiovascular: Normal rate, regular rhythm and normal heart sounds.   Pulmonary/Chest: Effort normal and breath sounds normal. Left breast exhibits no inverted nipple, no mass, no nipple discharge, no skin change and no tenderness.  Abdominal: Soft. Bowel sounds are normal.  Musculoskeletal: Normal range of motion.  Ankle  nonpitting edema bilaterally  Neurological: She is alert and oriented to person, place, and time. Gait normal.  Skin: Skin is warm and dry.  Nursing note and vitals reviewed.     LABORATORY DATA:  I have reviewed the data as listed. Lab Results  Component Value Date   WBC 5.8 10/22/2016   HGB 11.7 (L) 10/22/2016   HCT 35.9 (L) 10/22/2016   MCV 90.4 10/22/2016   PLT 257 10/22/2016   CMP     Component Value Date/Time   NA 139 10/22/2016 1505   K 3.9 10/22/2016 1505   CL 104 10/22/2016 1505   CO2 26 10/22/2016 1505   GLUCOSE 171 (H) 10/22/2016 1505   BUN 14 10/22/2016 1505   CREATININE 1.18 (H) 10/22/2016 1505   CALCIUM 9.1 10/22/2016 1505   PROT 7.0 10/22/2016 1505   ALBUMIN 3.7 10/22/2016 1505   AST 23 10/22/2016 1505   ALT 18 10/22/2016 1505   ALKPHOS 71 10/22/2016 1505   BILITOT 0.5 10/22/2016 1505   GFRNONAA 44 (L) 10/22/2016 1505   GFRAA 51 (L) 10/22/2016 1505     RADIOGRAPHIC  STUDIES: I have personally reviewed the radiological images as listed and agreed with the findings in the report. No results found.  ASSESSMENT & PLAN:  Stage IA right breast IDC ER/PR positive HER2 negative. Pathologic Stage Classification (pTNM, AJCC 8th Edition): Primary Tumor (pT): pT1b(m). Regional Lymph Nodes (pN): pN0. Distant Metastases (pM): MX.  PLAN: -continue anastrozole, tolerating well. -continue calcium-vitamin D.  - Will make sure baseline DEXA scan gets scheduled. - Repeat right diagnostic mammogram in July 2018. - She will be seeing Dr. Gerarda Fraction about her eye tomorrow. - RTC in 3 months for follow up.    All questions were answered. The patient knows to call the clinic with any problems, questions or concerns.   This document serves as a record of services personally performed by Twana First, MD. It was created on her behalf by Maryla Morrow, a trained medical scribe. The creation of this record is based on the scribe's personal observations and the provider's statements to them. This document has been checked and approved by the attending provider.  This note was electronically signed.    Twana First, MD 12/30/2016 2:02 PM

## 2016-12-30 NOTE — Patient Instructions (Signed)
[place] at [facility] Discharge Instructions  RECOMMENDATIONS MADE BY THE CONSULTANT AND ANY TEST RESULTS WILL BE SENT TO YOUR REFERRING PHYSICIAN.  You were seen today by Dr. Twana First Follow up in 3 months Be sure to get your DEXA scan    Thank you for choosing [facility] at [facility] to provide your oncology and hematology care.  To afford each patient quality time with our provider, please arrive at least 15 minutes before your scheduled appointment time.    If you have a lab appointment with the [facility] please come in thru the  Main Entrance and check in at the main information desk  You need to re-schedule your appointment should you arrive 10 or more minutes late.  We strive to give you quality time with our providers, and arriving late affects you and other patients whose appointments are after yours.  Also, if you no show three or more times for appointments you may be dismissed from the clinic at the providers discretion.     Again, thank you for choosing [facility].  Our hope is that these requests will decrease the amount of time that you wait before being seen by our physicians.       _____________________________________________________________  Should you have questions after your visit to [facility], please contact our office at [phone] between the hours of 8:30 a.m. and 4:30 p.m.  Voicemails left after 4:30 p.m. will not be returned until the following business day.  For prescription refill requests, have your pharmacy contact our office.       Resources For Cancer Patients and their Caregivers ? American Cancer Society: Can assist with transportation, wigs, general needs, runs Look Good Feel Better.        (639)590-0749 ? Cancer Care: Provides financial assistance, online support groups, medication/co-pay assistance.  1-800-813-HOPE (951) 817-6374) ? [place] Assists [place] cancer patients and their families through emotional , educational and financial support.  352-781-2125 ? [place] DSS Where to apply for food stamps, Medicaid and utility assistance. 336-802-3326 ? [facility]: Transportation to medical appointments. 9297426561 ? Social Security Administration: May apply for disability if have a Stage IV cancer. 613-857-2082 413-574-4000 ? [employer], Disability and Transit Services: Assists with nutrition, care and transit needs. [place] Support Programs: @10RELATIVEDAYS @ > Cancer Support Group  2nd Tuesday of the month 1pm-2pm, Journey Room  > Creative Journey  3rd Tuesday of the month 1130am-1pm, Journey Room  > Look Good Feel Better  1st Wednesday of the month 10am-12 noon, Journey Room (Call [place] to register [phone])

## 2016-12-31 DIAGNOSIS — E1140 Type 2 diabetes mellitus with diabetic neuropathy, unspecified: Secondary | ICD-10-CM | POA: Diagnosis not present

## 2016-12-31 DIAGNOSIS — I10 Essential (primary) hypertension: Secondary | ICD-10-CM | POA: Diagnosis not present

## 2016-12-31 DIAGNOSIS — R600 Localized edema: Secondary | ICD-10-CM | POA: Diagnosis not present

## 2016-12-31 DIAGNOSIS — E669 Obesity, unspecified: Secondary | ICD-10-CM | POA: Diagnosis not present

## 2016-12-31 DIAGNOSIS — Z6833 Body mass index (BMI) 33.0-33.9, adult: Secondary | ICD-10-CM | POA: Diagnosis not present

## 2016-12-31 DIAGNOSIS — E6609 Other obesity due to excess calories: Secondary | ICD-10-CM | POA: Diagnosis not present

## 2016-12-31 DIAGNOSIS — R252 Cramp and spasm: Secondary | ICD-10-CM | POA: Diagnosis not present

## 2016-12-31 DIAGNOSIS — I872 Venous insufficiency (chronic) (peripheral): Secondary | ICD-10-CM | POA: Diagnosis not present

## 2016-12-31 DIAGNOSIS — R201 Hypoesthesia of skin: Secondary | ICD-10-CM | POA: Diagnosis not present

## 2017-03-24 DIAGNOSIS — E1140 Type 2 diabetes mellitus with diabetic neuropathy, unspecified: Secondary | ICD-10-CM | POA: Diagnosis not present

## 2017-04-05 DIAGNOSIS — E782 Mixed hyperlipidemia: Secondary | ICD-10-CM | POA: Diagnosis not present

## 2017-04-05 DIAGNOSIS — E119 Type 2 diabetes mellitus without complications: Secondary | ICD-10-CM | POA: Diagnosis not present

## 2017-04-05 DIAGNOSIS — E1129 Type 2 diabetes mellitus with other diabetic kidney complication: Secondary | ICD-10-CM | POA: Diagnosis not present

## 2017-04-06 ENCOUNTER — Ambulatory Visit (HOSPITAL_COMMUNITY)
Admission: EM | Admit: 2017-04-06 | Discharge: 2017-04-06 | Disposition: A | Discharge status: Home / Self Care | Payer: Medicare HMO | Attending: Family Medicine | Admitting: Family Medicine

## 2017-04-06 ENCOUNTER — Encounter (HOSPITAL_COMMUNITY): Payer: Self-pay | Admitting: Physician Assistant

## 2017-04-06 DIAGNOSIS — H6593 Unspecified nonsuppurative otitis media, bilateral: Secondary | ICD-10-CM | POA: Diagnosis not present

## 2017-04-06 MED ORDER — CETIRIZINE-PSEUDOEPHEDRINE ER 5-120 MG PO TB12: 1.0000 | ORAL_TABLET | Freq: Every day | ORAL | 0 refills | Status: DC

## 2017-04-06 MED ORDER — FLUTICASONE PROPIONATE 50 MCG/ACT NA SUSP: 2.0000 | Freq: Every day | NASAL | 0 refills | Status: DC

## 2017-04-06 NOTE — Discharge Instructions (Signed)
There were fluid behind your ear that could be the cause of popping/moving sensation in your ear. Take allergy medicine/decongestant and flonase nasal spray to help with removing the fluid. Keep hydrated. Follow up with PCP if symptoms do not resolve for further treatment or workup needed.

## 2017-04-06 NOTE — ED Triage Notes (Signed)
Pt states she felt like something flew into her ear over a year ago.  She has had a full sensation in the ear since then.  One week ago she was eating an apple and felt a pop in her ear.  She has had some pain since then.  She denies any drainage.

## 2017-04-06 NOTE — ED Provider Notes (Signed)
[place]    CSN: [id number] Arrival date & time: 04/06/17  1501     History   Chief Complaint Chief Complaint  Patient presents with  . Otalgia    left    HPI [name] is a 76 y.o. female.   76 year old female with past medical history of breast cancer, see daily, hypertension, hyperlipidemia, diabetes, comes in for one-week history of left ear pain. Patient states she felt like something flew into her left ear one year ago, she had a ear exam, and showed no bug/foreign body, but she since experience occasional left ear discomfort. She states she feels as if something is in her ear, with popping and tingling sensation. She denies changes in hearing, ear discharge. Uses occasional cotton swab to clean ear, denies injury to ear. Denies recent travel, recent swimming, changes in altitude. Denies URI symptoms such as fever, congestion, cough, sore throat. Has occasional sneezing, but denies seasonal allergies. She states she was eating an apple 1 week ago, and felt a popping in her left ear while she was chewing. States sometimes the tingling and popping sensation travels down her throat. Denies trouble breathing, trouble swallowing, swelling of the throat. Denies weakness, dizziness, syncope.      Past Medical History:  Diagnosis Date  . Breast cancer ([place])   . CKD (chronic kidney disease) stage 2, GFR 60-89 ml/min   . Essential hypertension   . Hyperlipidemia   . Type 2 diabetes mellitus ([place])    diet controlled    Patient Active Problem List   Diagnosis Date Noted  . Post-menopausal 12/07/2016  . Malignant neoplasm of upper-inner quadrant of right female breast ([place])   . Chest pain, rule out acute myocardial infarction 06/03/2014  . Chest pain 06/03/2014  . Essential hypertension 06/03/2014  . Hyperglycemia 06/03/2014  . Hyponatremia 06/03/2014    Past Surgical History:  Procedure Laterality Date  . ABDOMINAL HYSTERECTOMY    . BREAST BIOPSY Left     benign  . EYE SURGERY Left    cataract removal  . PARTIAL MASTECTOMY WITH NEEDLE LOCALIZATION AND AXILLARY SENTINEL LYMPH NODE BX Right 10/28/2016   Procedure: PARTIAL MASTECTOMY WITH NEEDLE LOCALIZATION AND AXILLARY SENTINEL LYMPH NODE BX;  Surgeon: Aviva Signs, MD;  Location: [facility];  Service: General;  Laterality: Right;    OB History    Gravida Para Term Preterm AB Living   3 3 3          SAB TAB Ectopic Multiple Live Births                   Home Medications    Prior to Admission medications   Medication Sig Start Date End Date Taking? Authorizing Provider  anastrozole (ARIMIDEX) 1 MG tablet Take 1 tablet (1 mg total) by mouth daily. 12/07/16  Yes Twana First, MD  aspirin EC 81 MG tablet Take 1 tablet (81 mg total) by mouth daily. 06/03/14  Yes Samuella Cota, MD  Bilberry, Vaccinium myrtillus, (BILBERRY PO) Take 1,000 mg by mouth daily as needed (for eye health.).   Yes [provider]  calcium-vitamin D (OSCAL WITH D) 500-200 MG-UNIT tablet Take 2 tablets by mouth daily with breakfast. 12/07/16  Yes Twana First, MD  diltiazem [facility]) 120 MG 24 hr capsule Take 120 mg by mouth daily. 09/07/16  Yes [provider]  Menthol-Camphor (TIGER BALM ARTHRITIS RUB) 11-11 % CREA Apply 1 application topically 3 (three) times daily as needed (for pain (back)).  Yes [provider]  cetirizine-pseudoephedrine (ZYRTEC-D) 5-120 MG tablet Take 1 tablet by mouth daily. 04/06/17   Tasia Catchings, Teofil Maniaci V, PA-C  fluticasone (FLONASE) 50 MCG/ACT nasal spray Place 2 sprays into both nostrils daily. 04/06/17   Ok Edwards, PA-C  HYDROcodone-acetaminophen (NORCO) 5-325 MG tablet Take 1 tablet by mouth every 6 (six) hours as needed for moderate pain. 10/28/16   Aviva Signs, MD    Family History Family History  Problem Relation Age of Onset  . Hypertension Mother   . Hypertension Father   . Diabetes Mellitus II Sister   . Diabetes Mellitus II Brother   . Diabetes Mellitus II Brother      Social History Social History  Substance Use Topics  . Smoking status: Never Smoker  . Smokeless tobacco: Never Used  . Alcohol use No     Allergies   Patient has no known allergies.   Review of Systems Review of Systems  Reason unable to perform ROS: See HPI as above.     Physical Exam Triage Vital Signs ED Triage Vitals [04/06/17 1518]  Enc Vitals Group     BP (!) 152/79     Pulse Rate 90     Resp      Temp 98.3 F (36.8 C)     Temp Source Oral     SpO2 96 %     Weight      Height      Head Circumference      Peak Flow      Pain Score      Pain Loc      Pain Edu?      Excl. in [place]?    No data found.   Updated Vital Signs BP (!) 152/79 (BP Location: Left Arm)   Pulse 90   Temp 98.3 F (36.8 C) (Oral)   SpO2 96%   Visual Acuity Right Eye Distance:   Left Eye Distance:   Bilateral Distance:    Right Eye Near:   Left Eye Near:    Bilateral Near:     Physical Exam  Constitutional: She is oriented to person, place, and time. She appears well-developed and well-nourished. No distress.  HENT:  Head: Normocephalic and atraumatic.  Right Ear: External ear and ear canal normal. Tympanic membrane is not erythematous and not bulging. A middle ear effusion is present.  Left Ear: External ear and ear canal normal. Tympanic membrane is not erythematous and not bulging. A middle ear effusion is present.  Nose: Nose normal. Right sinus exhibits no maxillary sinus tenderness and no frontal sinus tenderness. Left sinus exhibits no maxillary sinus tenderness and no frontal sinus tenderness.  Mouth/Throat: Uvula is midline, oropharynx is clear and moist and mucous membranes are normal.  Eyes: Pupils are equal, round, and reactive to light. Conjunctivae are normal.  Neck: Normal range of motion. Neck supple.  Cardiovascular: Normal rate and regular rhythm.  Exam reveals no gallop and no friction rub.   Murmur (2/6 systolic murmur) heard. Pulmonary/Chest: Effort  normal and breath sounds normal. She has no decreased breath sounds. She has no wheezes. She has no rhonchi. She has no rales.  Lymphadenopathy:    She has no cervical adenopathy.  Neurological: She is alert and oriented to person, place, and time.  Skin: Skin is warm and dry.  Psychiatric: She has a normal mood and affect. Her behavior is normal. Judgment normal.     UC Treatments / Results  Labs (all labs ordered  are listed, but only abnormal results are displayed) Labs Reviewed - No data to display  EKG  EKG Interpretation None       Radiology No results found.  Procedures Procedures (including critical care time)  Medications Ordered in UC Medications - No data to display   Initial Impression / Assessment and Plan / UC Course  I have reviewed the triage vital signs and the nursing notes.  Pertinent labs & imaging results that were available during my care of the patient were reviewed by me and considered in my medical decision making (see chart for details).   Discussed with patient exam most consistent with middle ear effusion. Discussed with patient this could be due to allergies, eustachian tube dysfunction, changes in altitude/pressure. No signs of otitis media today. Given patient has not tried anything, will start with Zyrtec-D and Flonase. Patient to push fluids. To follow up with PCP for further evaluation and treatment if symptoms do not improve.  Final Clinical Impressions(s) / UC Diagnoses   Final diagnoses:  Fluid level behind tympanic membrane of both ears    New Prescriptions New Prescriptions   CETIRIZINE-PSEUDOEPHEDRINE (ZYRTEC-D) 5-120 MG TABLET    Take 1 tablet by mouth daily.   FLUTICASONE (FLONASE) 50 MCG/ACT NASAL SPRAY    Place 2 sprays into both nostrils daily.     Controlled Substance Prescriptions [place] Controlled Substance Registry consulted? Not Applicable   Arturo Morton 04/06/17 1541

## 2017-04-12 ENCOUNTER — Ambulatory Visit (HOSPITAL_COMMUNITY)
Admission: RE | Admit: 2017-04-12 | Discharge: 2017-04-12 | Disposition: A | Discharge status: Home / Self Care | Payer: Medicare HMO | Source: Ambulatory Visit | Attending: Oncology | Admitting: Oncology

## 2017-04-12 DIAGNOSIS — Z780 Asymptomatic menopausal state: Secondary | ICD-10-CM | POA: Insufficient documentation

## 2017-04-12 DIAGNOSIS — Z13820 Encounter for screening for osteoporosis: Secondary | ICD-10-CM | POA: Diagnosis not present

## 2017-04-13 NOTE — Progress Notes (Signed)
[name], [name] [id number]   CLINIC:  Medical Oncology/Hematology  PCP:  [facility], [place] [address] [phone]   REASON FOR VISIT:  Follow-up for Stage IA invasive ductal carcinoma of (R) breast; ER+/PR+/HER2- AND remote h/o left breast cancer   CURRENT THERAPY: Arimidex daily, beginning 11/2016    HISTORY OF PRESENT ILLNESS:  (From Dr. Laverle Patter last note on 12/30/16)  [name] 76 y.o. female is here because of a recent diagnosis of right-sided breast cancer.   She underwent a routine screening bilateral mammogram on 08/10/16 which demonstrated possible masses in the right breast and calcifications in the left breast.   Bilateral diagnostic mammogram with [place] on 09/15/16 which demonstrated two small irregular hypoechoic masses in the 2 o'clock position of the right breast periareolar . Targeted ultrasound is performed, showing two irregular hypoechoic masses in the 2 o'clock position of the right breast approximately 1-3 cm from the nipple. The larger mass, centered at 1-2 cm from the nipple, measures 6 x 4 x 5 mm. The smaller mass centered at 3 cm from the nipple measures 6 x 3 x 4 mm. Benign dystrophic calcifications in the deep outer left breast, no malignancy.   She had a right breast biopsy on 09/29/16: 1. Breast, right, needle core biopsy, 2:00, 3 cm from nipple - INVASIVE DUCTAL CARCINOMA WITH EXTRACELLULAR MUCIN, SEE COMMENT. 2. Breast, right, needle core biopsy, 2:00, 1 cm from nipple - INVASIVE DUCTAL CARCINOMA WITH EXTRACELLULAR MUCIN, SEE COMMENT. Hormone profile: ER/PR +, HER2 negative.   On 10/28/16, the patient underwent partial mastectomy with axillary sentinel lymph node biopsy with Dr. Arnoldo Morale.  Surgical path: 1. Lymph node, sentinel, biopsy, right axillary - TWO BENIGN LYMPH NODES WITH NO TUMOR SEEN (0/2).  2. Breast, partial mastectomy, right breast tissue - MULTIFOCAL INVASIVE  GRADE 1 DUCTAL CARCINOMA WITH ABUNDANT EXTRACELLULAR MUCIN. - TWO INVASIVE TUMOR FOCI MEASURING 0.7 CM AND 0.6 CM IN GREATEST DIMENSION. - ASSOCIATED INTERMEDIATE GRADE DUCTAL CARCINOMA IN SITU. - MARGINS ARE NEGATIVE. -Hormone profile: ER/PR +, HER2 negative.    She reports she is healing well from this procedure. She reports occasional discomfort in the surgical area. Sh reports occasional headaches. She denies chest pain, shortness of breath except with exertion, labored breathing, or abdominal pain.  Of note, she has a prior history of left-sided breast cancer status post radiation therapy. She denies any immediate family history of cancer.      INTERVAL HISTORY:  [name] 76 y.o. female returns for routine follow-up for right breast cancer. She also has prior history of (L) breast cancer.   Overall, she tells me that she feels "pretty good."  Appetite 75%; energy levels 50%.  Reports occasional pain in both breasts that "comes and goes."  Describes the pain as both sharp and dull at times.    Remains on Arimidex with great tolerance. Denies any arthralgias, hot flashes, or vaginal dryness.   She has chronic issues with feet and hand swelling; symptoms are largely unchanged. She also has (L) hand peripheral neuropathy, which is also chronic and stable.    REVIEW OF SYSTEMS:  Review of Systems  Constitutional: Positive for fatigue. Negative for chills and fever.  HENT:  Negative.  Negative for lump/mass and nosebleeds.   Eyes: Negative.   Respiratory: Negative.  Negative for cough and shortness of breath.   Cardiovascular: Positive for leg swelling. Negative for chest pain.  Gastrointestinal: Negative.  Negative  for abdominal pain, blood in stool, constipation, diarrhea, nausea and vomiting.  Endocrine: Negative.   Genitourinary: Negative.  Negative for dysuria, hematuria and vaginal bleeding.   Musculoskeletal: Negative.  Negative for arthralgias.  Skin: Negative.   Negative for rash.  Neurological: Positive for numbness. Negative for dizziness and headaches.  Hematological: Negative.  Negative for adenopathy. Does not bruise/bleed easily.  Psychiatric/Behavioral: Negative.  Negative for depression and sleep disturbance. The patient is not nervous/anxious.      PAST MEDICAL/SURGICAL HISTORY:  Past Medical History:  Diagnosis Date  . Breast cancer ([place])   . CKD (chronic kidney disease) stage 2, GFR 60-89 ml/min   . Essential hypertension   . Hyperlipidemia   . Type 2 diabetes mellitus (HCC)    diet controlled   Past Surgical History:  Procedure Laterality Date  . ABDOMINAL HYSTERECTOMY    . BREAST BIOPSY Left    benign  . EYE SURGERY Left    cataract removal  . PARTIAL MASTECTOMY WITH NEEDLE LOCALIZATION AND AXILLARY SENTINEL LYMPH NODE BX Right 10/28/2016   Procedure: PARTIAL MASTECTOMY WITH NEEDLE LOCALIZATION AND AXILLARY SENTINEL LYMPH NODE BX;  Surgeon: Aviva Signs, MD;  Location: [facility];  Service: General;  Laterality: Right;     SOCIAL HISTORY:  Social History   Social History  . Marital status: Married    Spouse name: N/A  . Number of children: N/A  . Years of education: N/A   Occupational History  . Not on file.   Social History Main Topics  . Smoking status: Never Smoker  . Smokeless tobacco: Never Used  . Alcohol use No  . Drug use: No  . Sexual activity: Not Currently    Birth control/ protection: Surgical   Other Topics Concern  . Not on file   Social History Narrative  . No narrative on file    FAMILY HISTORY:  Family History  Problem Relation Age of Onset  . Hypertension Mother   . Hypertension Father   . Diabetes Mellitus II Sister   . Diabetes Mellitus II Brother   . Diabetes Mellitus II Brother     CURRENT MEDICATIONS:  Outpatient Encounter Prescriptions as of 04/14/2017  Medication Sig Note  . anastrozole (ARIMIDEX) 1 MG tablet Take 1 tablet (1 mg total) by mouth daily.   Marland Kitchen aspirin EC 81 MG  tablet Take 1 tablet (81 mg total) by mouth daily. 10/16/2016: No specific time of day  . Bilberry, Vaccinium myrtillus, (BILBERRY PO) Take 1,000 mg by mouth daily as needed (for eye health.).   Marland Kitchen calcium-vitamin D (OSCAL WITH D) 500-200 MG-UNIT tablet Take 2 tablets by mouth daily with breakfast.   . cetirizine-pseudoephedrine (ZYRTEC-D) 5-120 MG tablet Take 1 tablet by mouth daily.   Marland Kitchen diltiazem (TIAZAC) 120 MG 24 hr capsule Take 120 mg by mouth daily.   . fluticasone (FLONASE) 50 MCG/ACT nasal spray Place 2 sprays into both nostrils daily.   Marland Kitchen HYDROcodone-acetaminophen (NORCO) 5-325 MG tablet Take 1 tablet by mouth every 6 (six) hours as needed for moderate pain.   . Menthol-Camphor (TIGER BALM ARTHRITIS RUB) 11-11 % CREA Apply 1 application topically 3 (three) times daily as needed (for pain (back)).     No facility-administered encounter medications on file as of 04/14/2017.     ALLERGIES:  No Known Allergies   PHYSICAL EXAM:  ECOG Performance status: 1 - Symptomatic; remains independent.   Vitals:   04/14/17 1047  BP: (!) 174/80  Pulse: 80  Resp: 18  SpO2: 100%   Filed Weights   04/14/17 1047  Weight: 187 lb (84.8 kg)    Physical Exam  Constitutional: She is oriented to person, place, and time and well-developed, well-nourished, and in no distress.  HENT:  Head: Normocephalic.  Mouth/Throat: Oropharynx is clear and moist. No oropharyngeal exudate.  Eyes: Pupils are equal, round, and reactive to light. Conjunctivae are normal. No scleral icterus.  Neck: Normal range of motion. Neck supple.  Cardiovascular: Normal rate and regular rhythm.   Pulmonary/Chest: Effort normal and breath sounds normal. No respiratory distress.    Abdominal: Soft. Bowel sounds are normal. There is no tenderness.  Musculoskeletal: Normal range of motion. She exhibits edema (Trace ankle edema).  Lymphadenopathy:    She has no cervical adenopathy.       Right: No supraclavicular adenopathy  present.       Left: No supraclavicular adenopathy present.  Neurological: She is alert and oriented to person, place, and time. No cranial nerve deficit. Gait normal.  Skin: Skin is warm and dry. No rash noted.  Psychiatric: Mood, memory, affect and judgment normal.  Nursing note and vitals reviewed.    LABORATORY DATA:  I have reviewed the labs as listed.  CBC    Component Value Date/Time   WBC 6.3 12/30/2016 1403   RBC 4.22 12/30/2016 1403   HGB 12.1 12/30/2016 1403   HCT 38.3 12/30/2016 1403   PLT 276 12/30/2016 1403   MCV 90.8 12/30/2016 1403   MCH 28.7 12/30/2016 1403   MCHC 31.6 12/30/2016 1403   RDW 12.5 12/30/2016 1403   LYMPHSABS 1.4 12/30/2016 1403   MONOABS 0.4 12/30/2016 1403   EOSABS 0.1 12/30/2016 1403   BASOSABS 0.0 12/30/2016 1403   CMP Latest Ref Rng & Units 12/30/2016 10/22/2016 02/08/2016  Glucose 65 - 99 mg/dL 159(H) 171(H) 212(H)  BUN 6 - 20 mg/dL 17 14 22(H)  Creatinine 0.44 - 1.00 mg/dL 1.28(H) 1.18(H) 1.30(H)  Sodium 135 - 145 mmol/L 139 139 137  Potassium 3.5 - 5.1 mmol/L 4.2 3.9 3.9  Chloride 101 - 111 mmol/L 102 104 104  CO2 22 - 32 mmol/L '28 26 26  ' Calcium 8.9 - 66.4 mg/dL 9.8 9.1 9.0  Total Protein 6.5 - 8.1 g/dL 7.7 7.0 -  Total Bilirubin 0.3 - 1.2 mg/dL 0.5 0.5 -  Alkaline Phos 38 - 126 U/L 77 71 -  AST 15 - 41 U/L 24 23 -  ALT 14 - 54 U/L 18 18 -    PENDING LABS:    DIAGNOSTIC IMAGING:  *The following radiologic images and reports have been reviewed independently and agree with below findings.  DEXA scan: 04/12/17 EXAM: DUAL X-RAY ABSORPTIOMETRY (DXA) FOR BONE MINERAL DENSITY  IMPRESSION: Ordering Physician:  Dr. Twana First,  Your patient Kayann Maj completed a BMD test on 04/12/2017 using the [employer] (software version: 14.10) manufactured by [employer]. The following summarizes the results of our evaluation. PATIENT BIOGRAPHICAL: Name: [name], [name] Patient ID: [id number] Birth  Date: 02/23/1941 Height: 63.5 in. Gender: Female Exam Date: 04/12/2017 Weight: 190.0 lbs. Indications: Hx Breast Ca, Partial Hysterectomy, Post Menopausal, Unilateral oophrectomy Fractures: Treatments: Asprin, Calcium, Vitamin D DENSITOMETRY RESULTS: Site         Region      Measured Date Measured Age WHO Classification Young Adult T-score BMD         %Change vs. Previous Significant Change (*) DualFemur Total Right 04/12/2017 77.3 Normal -0.6 0.936 g/cm2 -0.7% - DualFemur Total  Right 04/10/2015 75.2 Normal -0.5 0.943 g/cm2 -1.3% - DualFemur Total Right 01[birth year]29/2011 71.1 Normal -0.4 0.955 g/cm2 - -  Left Forearm Radius 33% 04/12/2017 77.3 Normal 1.2 0.801 g/cm2 1.2% - Left Forearm Radius 33% 04/10/2015 75.2 Normal 1.1 0.791 g/cm2 - - ASSESSMENT: BMD as determined from Femur Total Right is 0.936 g/cm2 with a T-Score of -0.6. This patient is considered normal according to [facility]) criteria. Compared with the prior study on 04/10/2015, the BMD of the lt. forearm/rt. total hip show no statistically significant change. (Lumbar spine was not utilized due to advanced degenerative changes.)    PATHOLOGY:  (R) lumpectomy surgical path: 10/28/16         ASSESSMENT & PLAN:   Stage IA invasive ductal carcinoma of (R) breast; ER+/PR+/HER2-:  -Diagnosed in 08/2016 after abnormal screening mammogram.  Treated with (R) lumpectomy with SLNB with Dr. Arnoldo Morale on 10/28/16. Dr. Talbert Cage did not think patient would benefit from adjuvant radiation therapy and thus she was started on anti-estrogen therapy with Arimidex in 11/2016.   -Remains on Arimidex with good tolerance. Will plan to continue for 5-10 years. Briefly discussed Breast Cancer Index (BCI) testing; can certainly order in the future, if needed.  -Clinical breast exam performed today and benign. There is possible seroma to (R) breast in sub-areolar region on exam; this does not appear to be suspicious for recurrent  disease and could be likely cause of patient's intermittent breast pain.   -Mammogram due 08/2017; orders placed today.  -Return to cancer center in 4 months for follow-up.   Bone health:  -Baseline DEXA scan done on 04/12/17 revealed normal bone density with T-score -0.8.  -She understands that aromatase inhibitor therapy can decrease bone density over time. Encouraged her to take calcium/vitamin D supplementation and increase weight-bearing activities as tolerated.   -Biennial DEXA scan will be due in 04/2019; will place orders at subsequent follow-up visits.       Dispo:  -Mammogram due 08/2017; orders placed today.  -Return to cancer center in 4 months for follow-up after mammogram.    All questions were answered to patient's stated satisfaction. Encouraged patient to call with any new concerns or questions before her next visit to the cancer center and we can certain see her sooner, if needed.    Plan of care discussed with Dr. Talbert Cage, who agrees with the above aforementioned.    Orders placed this encounter:  Orders Placed This Encounter  Procedures  . MM DIAG BREAST TOMO BILATERAL      Mike Craze, NP [place] [phone]

## 2017-04-14 ENCOUNTER — Encounter (HOSPITAL_COMMUNITY): Payer: Self-pay | Admitting: Adult Health

## 2017-04-14 ENCOUNTER — Encounter (HOSPITAL_COMMUNITY): Payer: Medicare HMO | Attending: Adult Health | Admitting: Adult Health

## 2017-04-14 VITALS — BP 174/80 | HR 80 | Resp 18 | Ht 63.0 in | Wt 187.0 lb

## 2017-04-14 DIAGNOSIS — Z79811 Long term (current) use of aromatase inhibitors: Secondary | ICD-10-CM | POA: Diagnosis not present

## 2017-04-14 DIAGNOSIS — C50911 Malignant neoplasm of unspecified site of right female breast: Secondary | ICD-10-CM | POA: Diagnosis not present

## 2017-04-14 DIAGNOSIS — Z170 Estrogen receptor positive status [ER+]: Secondary | ICD-10-CM | POA: Diagnosis not present

## 2017-04-14 DIAGNOSIS — Z853 Personal history of malignant neoplasm of breast: Secondary | ICD-10-CM | POA: Diagnosis not present

## 2017-04-14 DIAGNOSIS — C50211 Malignant neoplasm of upper-inner quadrant of right female breast: Secondary | ICD-10-CM

## 2017-04-14 NOTE — Progress Notes (Signed)
Patient taking anastrozole as prescribed. Has not missed any doses & reports no side effects.

## 2017-04-14 NOTE — Patient Instructions (Signed)
[place] at [facility] Discharge Instructions  RECOMMENDATIONS MADE BY THE CONSULTANT AND ANY TEST RESULTS WILL BE SENT TO YOUR REFERRING PHYSICIAN.  You were seen today by Mike Craze NP. Your mammogram is due in January 2019. Return in 4 months for follow up.   Thank you for choosing [place] at [facility] to provide your oncology and hematology care.  To afford each patient quality time with our provider, please arrive at least 15 minutes before your scheduled appointment time.    If you have a lab appointment with the [facility] please come in thru the  Main Entrance and check in at the main information desk  You need to re-schedule your appointment should you arrive 10 or more minutes late.  We strive to give you quality time with our providers, and arriving late affects you and other patients whose appointments are after yours.  Also, if you no show three or more times for appointments you may be dismissed from the clinic at the providers discretion.     Again, thank you for choosing [facility].  Our hope is that these requests will decrease the amount of time that you wait before being seen by our physicians.       _____________________________________________________________  Should you have questions after your visit to [facility], please contact our office at [phone] between the hours of 8:30 a.m. and 4:30 p.m.  Voicemails left after 4:30 p.m. will not be returned until the following business day.  For prescription refill requests, have your pharmacy contact our office.       Resources For Cancer Patients and their Caregivers ? American Cancer Society: Can assist with transportation, wigs, general needs, runs Look Good Feel Better.        4693141866 ? Cancer Care: Provides financial assistance, online support groups, medication/co-pay assistance.  1-800-813-HOPE 218-321-4510) ? [employer] Assists [place] cancer patients and their families through emotional , educational and financial support.  639-885-4253 ? [place] DSS Where to apply for food stamps, Medicaid and utility assistance. (413) 798-2866 ? [facility]: Transportation to medical appointments. 5595595057 ? Social Security Administration: May apply for disability if have a Stage IV cancer. 850-648-4676 210-499-3694 ? [employer], Disability and Transit Services: Assists with nutrition, care and transit needs. [place] Support Programs: @10RELATIVEDAYS @ > Cancer Support Group  2nd Tuesday of the month 1pm-2pm, Journey Room  > Creative Journey  3rd Tuesday of the month 1130am-1pm, Journey Room  > Look Good Feel Better  1st Wednesday of the month 10am-12 noon, Journey Room (Call [place] to register [phone])

## 2017-04-17 DIAGNOSIS — Z1211 Encounter for screening for malignant neoplasm of colon: Secondary | ICD-10-CM | POA: Diagnosis not present

## 2017-04-23 DIAGNOSIS — H6522 Chronic serous otitis media, left ear: Secondary | ICD-10-CM | POA: Diagnosis not present

## 2017-04-23 DIAGNOSIS — E1140 Type 2 diabetes mellitus with diabetic neuropathy, unspecified: Secondary | ICD-10-CM | POA: Diagnosis not present

## 2017-04-23 DIAGNOSIS — E119 Type 2 diabetes mellitus without complications: Secondary | ICD-10-CM | POA: Diagnosis not present

## 2017-04-23 DIAGNOSIS — E6609 Other obesity due to excess calories: Secondary | ICD-10-CM | POA: Diagnosis not present

## 2017-04-23 DIAGNOSIS — Z6832 Body mass index (BMI) 32.0-32.9, adult: Secondary | ICD-10-CM | POA: Diagnosis not present

## 2017-04-26 DIAGNOSIS — Z0001 Encounter for general adult medical examination with abnormal findings: Secondary | ICD-10-CM | POA: Diagnosis not present

## 2017-04-26 DIAGNOSIS — Z6833 Body mass index (BMI) 33.0-33.9, adult: Secondary | ICD-10-CM | POA: Diagnosis not present

## 2017-04-26 DIAGNOSIS — M1991 Primary osteoarthritis, unspecified site: Secondary | ICD-10-CM | POA: Diagnosis not present

## 2017-04-26 DIAGNOSIS — E6609 Other obesity due to excess calories: Secondary | ICD-10-CM | POA: Diagnosis not present

## 2017-04-26 DIAGNOSIS — I10 Essential (primary) hypertension: Secondary | ICD-10-CM | POA: Diagnosis not present

## 2017-04-26 DIAGNOSIS — E1129 Type 2 diabetes mellitus with other diabetic kidney complication: Secondary | ICD-10-CM | POA: Diagnosis not present

## 2017-07-21 DIAGNOSIS — E119 Type 2 diabetes mellitus without complications: Secondary | ICD-10-CM | POA: Diagnosis not present

## 2017-07-28 DIAGNOSIS — E6609 Other obesity due to excess calories: Secondary | ICD-10-CM | POA: Diagnosis not present

## 2017-07-28 DIAGNOSIS — Z1389 Encounter for screening for other disorder: Secondary | ICD-10-CM | POA: Diagnosis not present

## 2017-07-28 DIAGNOSIS — R200 Anesthesia of skin: Secondary | ICD-10-CM | POA: Diagnosis not present

## 2017-07-28 DIAGNOSIS — Z6833 Body mass index (BMI) 33.0-33.9, adult: Secondary | ICD-10-CM | POA: Diagnosis not present

## 2017-08-12 NOTE — Progress Notes (Deleted)
[place], [place] [address]   CLINIC:  Medical Oncology/Hematology  PCP:  [facility], [place] [address] [phone]   REASON FOR VISIT:  Follow-up for Stage IA invasive ductal carcinoma of (R) breast; ER+/PR+/HER2- AND remote h/o left breast cancer   CURRENT THERAPY: Arimidex daily, beginning 11/2016    HISTORY OF PRESENT ILLNESS:  (From Dr. Laverle Patter last note on 12/30/16)  [name] 76 y.o. female is here because of a recent diagnosis of right-sided breast cancer.   She underwent a routine screening bilateral mammogram on 08/10/16 which demonstrated possible masses in the right breast and calcifications in the left breast.   Bilateral diagnostic mammogram with [place] on 09/15/16 which demonstrated two small irregular hypoechoic masses in the 2 o'clock position of the right breast periareolar . Targeted ultrasound is performed, showing two irregular hypoechoic masses in the 2 o'clock position of the right breast approximately 1-3 cm from the nipple. The larger mass, centered at 1-2 cm from the nipple, measures 6 x 4 x 5 mm. The smaller mass centered at 3 cm from the nipple measures 6 x 3 x 4 mm. Benign dystrophic calcifications in the deep outer left breast, no malignancy.   She had a right breast biopsy on 09/29/16: 1. Breast, right, needle core biopsy, 2:00, 3 cm from nipple - INVASIVE DUCTAL CARCINOMA WITH EXTRACELLULAR MUCIN, SEE COMMENT. 2. Breast, right, needle core biopsy, 2:00, 1 cm from nipple - INVASIVE DUCTAL CARCINOMA WITH EXTRACELLULAR MUCIN, SEE COMMENT. Hormone profile: ER/PR +, HER2 negative.   On 10/28/16, the patient underwent partial mastectomy with axillary sentinel lymph node biopsy with Dr. Arnoldo Morale.  Surgical path: 1. Lymph node, sentinel, biopsy, right axillary - TWO BENIGN LYMPH NODES WITH NO TUMOR SEEN (0/2).  2. Breast, partial mastectomy, right breast tissue - MULTIFOCAL INVASIVE  GRADE 1 DUCTAL CARCINOMA WITH ABUNDANT EXTRACELLULAR MUCIN. - TWO INVASIVE TUMOR FOCI MEASURING 0.7 CM AND 0.6 CM IN GREATEST DIMENSION. - ASSOCIATED INTERMEDIATE GRADE DUCTAL CARCINOMA IN SITU. - MARGINS ARE NEGATIVE. -Hormone profile: ER/PR +, HER2 negative.    She reports she is healing well from this procedure. She reports occasional discomfort in the surgical area. Sh reports occasional headaches. She denies chest pain, shortness of breath except with exertion, labored breathing, or abdominal pain.  Of note, she has a prior history of left-sided breast cancer status post radiation therapy. She denies any immediate family history of cancer.      INTERVAL HISTORY:  [name] 76 y.o. female returns for routine follow-up for right breast cancer. She also has prior history of (L) breast cancer.   Overall, she tells me that she feels "pretty good."  Appetite 75%; energy levels 50%.  Reports occasional pain in both breasts that "comes and goes."  Describes the pain as both sharp and dull at times.    Remains on Arimidex with great tolerance. Denies any arthralgias, hot flashes, or vaginal dryness.   She has chronic issues with feet and hand swelling; symptoms are largely unchanged. She also has (L) hand peripheral neuropathy, which is also chronic and stable.    REVIEW OF SYSTEMS:  Review of Systems  Constitutional: Positive for fatigue. Negative for chills and fever.  HENT:  Negative.  Negative for lump/mass and nosebleeds.   Eyes: Negative.   Respiratory: Negative.  Negative for cough and shortness of breath.   Cardiovascular: Positive for leg swelling. Negative for chest pain.  Gastrointestinal: Negative.  Negative  for abdominal pain, blood in stool, constipation, diarrhea, nausea and vomiting.  Endocrine: Negative.   Genitourinary: Negative.  Negative for dysuria, hematuria and vaginal bleeding.   Musculoskeletal: Negative.  Negative for arthralgias.  Skin: Negative.   Negative for rash.  Neurological: Positive for numbness. Negative for dizziness and headaches.  Hematological: Negative.  Negative for adenopathy. Does not bruise/bleed easily.  Psychiatric/Behavioral: Negative.  Negative for depression and sleep disturbance. The patient is not nervous/anxious.      PAST MEDICAL/SURGICAL HISTORY:  Past Medical History:  Diagnosis Date  . Breast cancer ([place] [facility])   . CKD (chronic kidney disease) stage 2, GFR 60-89 ml/min   . Essential hypertension   . Hyperlipidemia   . Type 2 diabetes mellitus (H[facility])    diet controlled   Past Surgical History:  Procedure Laterality Date  . ABDOMINAL HYSTERECTOMY    . BREAST BIOPSY Left    benign  . EYE SURGERY Left    cataract removal  . PARTIAL MASTECTOMY WITH NEEDLE LOCALIZATION AND AXILLARY SENTINEL LYMPH NODE BX Right 10/28/2016   Procedure: PARTIAL MASTECTOMY WITH NEEDLE LOCALIZATION AND AXILLARY SENTINEL LYMPH NODE BX;  Surgeon: Aviva Signs, MD;  Location: [facility];  Service: General;  Laterality: Right;     SOCIAL HISTORY:  Social History   Socioeconomic History  . Marital status: Married    Spouse name: Not on file  . Number of children: Not on file  . Years of education: Not on file  . Highest education level: Not on file  Social Needs  . Financial resource strain: Not on file  . Food insecurity - worry: Not on file  . Food insecurity - inability: Not on file  . Transportation needs - medical: Not on file  . Transportation needs - non-medical: Not on file  Occupational History  . Not on file  Tobacco Use  . Smoking status: Never Smoker  . Smokeless tobacco: Never Used  Substance and Sexual Activity  . Alcohol use: No  . Drug use: No  . Sexual activity: Not Currently    Birth control/protection: Surgical  Other Topics Concern  . Not on file  Social History Narrative  . Not on file    FAMILY HISTORY:  Family History  Problem Relation Age of Onset  . Hypertension Mother   . Hypertension  Father   . Diabetes Mellitus II Sister   . Diabetes Mellitus II Brother   . Diabetes Mellitus II Brother     CURRENT MEDICATIONS:  Outpatient Encounter Medications as of 08/13/2017  Medication Sig Note  . anastrozole (ARIMIDEX) 1 MG tablet Take 1 tablet (1 mg total) by mouth daily.   Marland Kitchen aspirin EC 81 MG tablet Take 1 tablet (81 mg total) by mouth daily. 10/16/2016: No specific time of day  . Bilberry, Vaccinium myrtillus, (BILBERRY PO) Take 1,000 mg by mouth daily as needed (for eye health.).   Marland Kitchen calcium-vitamin D (OSCAL WITH D) 500-200 MG-UNIT tablet Take 2 tablets by mouth daily with breakfast.   . cetirizine-pseudoephedrine (ZYRTEC-D) 5-120 MG tablet Take 1 tablet by mouth daily.   Marland Kitchen diltiazem (TIAZAC) 120 MG 24 hr capsule Take 120 mg by mouth daily.   . fluticasone (FLONASE) 50 MCG/ACT nasal spray Place 2 sprays into both nostrils daily.   Marland Kitchen HYDROcodone-acetaminophen (NORCO) 5-325 MG tablet Take 1 tablet by mouth every 6 (six) hours as needed for moderate pain.   . Menthol-Camphor (TIGER BALM ARTHRITIS RUB) 11-11 % CREA Apply 1 application topically 3 (three) times daily  as needed (for pain (back)).     No facility-administered encounter medications on file as of 08/13/2017.     ALLERGIES:  No Known Allergies   PHYSICAL EXAM:  ECOG Performance status: 1 - Symptomatic; remains independent.   There were no vitals filed for this visit. There were no vitals filed for this visit.  Physical Exam  Constitutional: She is oriented to person, place, and time and well-developed, well-nourished, and in no distress.  HENT:  Head: Normocephalic.  Mouth/Throat: Oropharynx is clear and moist. No oropharyngeal exudate.  Eyes: Conjunctivae are normal. Pupils are equal, round, and reactive to light. No scleral icterus.  Neck: Normal range of motion. Neck supple.  Cardiovascular: Normal rate and regular rhythm.  Pulmonary/Chest: Effort normal and breath sounds normal. No respiratory distress.      Abdominal: Soft. Bowel sounds are normal. There is no tenderness.  Musculoskeletal: Normal range of motion. She exhibits edema (Trace ankle edema).  Lymphadenopathy:    She has no cervical adenopathy.       Right: No supraclavicular adenopathy present.       Left: No supraclavicular adenopathy present.  Neurological: She is alert and oriented to person, place, and time. No cranial nerve deficit. Gait normal.  Skin: Skin is warm and dry. No rash noted.  Psychiatric: Mood, memory, affect and judgment normal.  Nursing note and vitals reviewed.    LABORATORY DATA:  I have reviewed the labs as listed.  CBC    Component Value Date/Time   WBC 6.3 12/30/2016 1403   RBC 4.22 12/30/2016 1403   HGB 12.1 12/30/2016 1403   HCT 38.3 12/30/2016 1403   PLT 276 12/30/2016 1403   MCV 90.8 12/30/2016 1403   MCH 28.7 12/30/2016 1403   MCHC 31.6 12/30/2016 1403   RDW 12.5 12/30/2016 1403   LYMPHSABS 1.4 12/30/2016 1403   MONOABS 0.4 12/30/2016 1403   EOSABS 0.1 12/30/2016 1403   BASOSABS 0.0 12/30/2016 1403   CMP Latest Ref Rng & Units 12/30/2016 10/22/2016 02/08/2016  Glucose 65 - 99 mg/dL 159(H) 171(H) 212(H)  BUN 6 - 20 mg/dL 17 14 22(H)  Creatinine 0.44 - 1.00 mg/dL 1.28(H) 1.18(H) 1.30(H)  Sodium 135 - 145 mmol/L 139 139 137  Potassium 3.5 - 5.1 mmol/L 4.2 3.9 3.9  Chloride 101 - 111 mmol/L 102 104 104  CO2 22 - 32 mmol/L '28 26 26  ' Calcium 8.9 - 10.3 mg/dL 9.8 9.1 9.0  Total Protein 6.5 - 8.1 g/dL 7.7 7.0 -  Total Bilirubin 0.3 - 1.2 mg/dL 0.5 0.5 -  Alkaline Phos 38 - 126 U/L 77 71 -  AST 15 - 41 U/L 24 23 -  ALT 14 - 54 U/L 18 18 -    PENDING LABS:    DIAGNOSTIC IMAGING:  *The following radiologic images and reports have been reviewed independently and agree with below findings.  DEXA scan: 04/12/17 EXAM: DUAL X-RAY ABSORPTIOMETRY (DXA) FOR BONE MINERAL DENSITY  IMPRESSION: Ordering Physician:  Dr. Twana First,  Your patient [name] completed a BMD test on  04/12/2017 using the [place] (software version: 14.10) manufactured by [employer]. The following summarizes the results of our evaluation. PATIENT BIOGRAPHICAL: Name: [name], [name] Patient ID: [id number] Birth Date: 1941/01/24 Height: 63.5 in. Gender: Female Exam Date: 04/12/2017 Weight: 190.0 lbs. Indications: Hx Breast Ca, Partial Hysterectomy, Post Menopausal, Unilateral oophrectomy Fractures: Treatments: Asprin, Calcium, Vitamin D DENSITOMETRY RESULTS: Site         Region  Measured Date Measured Age WHO Classification Young Adult T-score BMD         %Change vs. Previous Significant Change (*) DualFemur Total Right 04/12/2017 77.3 Normal -0.6 0.936 g/cm2 -0.7% - DualFemur Total Right 3/10/2015 75.2 Normal -0.5 0.943 g/cm2 -1.3% - DualFemur Total Right 01[birth year]29/2011 71.1 Normal -0.4 0.955 g/cm2 - -  Left Forearm Radius 33% 04/12/2017 77.3 Normal 1.2 0.801 g/cm2 1.2% - Left Forearm Radius 33% 04/10/2015 75.2 Normal 1.1 0.791 g/cm2 - - ASSESSMENT: BMD as determined from Femur Total Right is 0.936 g/cm2 with a T-Score of -0.6. This patient is considered normal according to [facility]) criteria. Compared with the prior study on 04/10/2015, the BMD of the lt. forearm/rt. total hip show no statistically significant change. (Lumbar spine was not utilized due to advanced degenerative changes.)    PATHOLOGY:  (R) lumpectomy surgical path: 10/28/16         ASSESSMENT & PLAN:   Stage IA invasive ductal carcinoma of (R) breast; ER+/PR+/HER2-:  -Diagnosed in 08/2016 after abnormal screening mammogram.  Treated with (R) lumpectomy with SLNB with Dr. Arnoldo Morale on 10/28/16. Dr. Talbert Cage did not think patient would benefit from adjuvant radiation therapy and thus she was started on anti-estrogen therapy with Arimidex in 11/2016.   -Remains on Arimidex with good tolerance. Will plan to continue for 5-10 years. Briefly discussed Breast  Cancer Index (BCI) testing; can certainly order in the future, if needed.  -Clinical breast exam performed today and benign. There is possible seroma to (R) breast in sub-areolar region on exam; this does not appear to be suspicious for recurrent disease and could be likely cause of patient's intermittent breast pain.   -Mammogram due 08/2017; orders placed today.  -Return to cancer center in 4 months for follow-up.   Bone health:  -Baseline DEXA scan done on 04/12/17 revealed normal bone density with T-score -0.8.  -She understands that aromatase inhibitor therapy can decrease bone density over time. Encouraged her to take calcium/vitamin D supplementation and increase weight-bearing activities as tolerated.   -Biennial DEXA scan will be due in 04/2019; will place orders at subsequent follow-up visits.       Dispo:  -Mammogram due 08/2017; orders placed today.  -Return to cancer center in 4 months for follow-up after mammogram.    All questions were answered to patient's stated satisfaction. Encouraged patient to call with any new concerns or questions before her next visit to the cancer center and we can certain see her sooner, if needed.    Plan of care discussed with Dr. Talbert Cage, who agrees with the above aforementioned.    Orders placed this encounter:  No orders of the defined types were placed in this encounter.     Mike Craze, NP [place] [phone]

## 2017-08-13 ENCOUNTER — Ambulatory Visit (HOSPITAL_COMMUNITY): Payer: Medicare HMO | Admitting: Adult Health

## 2017-08-30 ENCOUNTER — Encounter (HOSPITAL_COMMUNITY): Payer: Self-pay | Admitting: Oncology

## 2017-08-30 ENCOUNTER — Encounter (HOSPITAL_COMMUNITY): Payer: Medicare HMO | Attending: Hematology and Oncology | Admitting: Oncology

## 2017-08-30 VITALS — BP 171/76 | HR 86 | Temp 98.9°F | Resp 18 | Wt 187.8 lb

## 2017-08-30 DIAGNOSIS — Z853 Personal history of malignant neoplasm of breast: Secondary | ICD-10-CM | POA: Diagnosis not present

## 2017-08-30 DIAGNOSIS — Z79811 Long term (current) use of aromatase inhibitors: Secondary | ICD-10-CM

## 2017-08-30 DIAGNOSIS — C50211 Malignant neoplasm of upper-inner quadrant of right female breast: Secondary | ICD-10-CM

## 2017-08-30 DIAGNOSIS — M7989 Other specified soft tissue disorders: Secondary | ICD-10-CM

## 2017-08-30 DIAGNOSIS — Z170 Estrogen receptor positive status [ER+]: Secondary | ICD-10-CM | POA: Diagnosis not present

## 2017-08-30 DIAGNOSIS — G620 Drug-induced polyneuropathy: Secondary | ICD-10-CM | POA: Diagnosis not present

## 2017-08-30 NOTE — Progress Notes (Signed)
[facility], [place] [address]   CLINIC:  Medical Oncology/Hematology  PCP:  [facility], [place] [address] [phone]   REASON FOR VISIT:  Follow-up for Stage IA invasive ductal carcinoma of (R) breast; ER+/PR+/HER2- AND remote h/o left breast cancer   CURRENT THERAPY: Arimidex daily, beginning 11/2016    HISTORY OF PRESENT ILLNESS:  (From Dr. Laverle Patter last note on 12/30/16)  [name] 76 y.o. female is here because of a recent diagnosis of right-sided breast cancer.   She underwent a routine screening bilateral mammogram on 08/10/16 which demonstrated possible masses in the right breast and calcifications in the left breast.   Bilateral diagnostic mammogram with [place] on 09/15/16 which demonstrated two small irregular hypoechoic masses in the 2 o'clock position of the right breast periareolar . Targeted ultrasound is performed, showing two irregular hypoechoic masses in the 2 o'clock position of the right breast approximately 1-3 cm from the nipple. The larger mass, centered at 1-2 cm from the nipple, measures 6 x 4 x 5 mm. The smaller mass centered at 3 cm from the nipple measures 6 x 3 x 4 mm. Benign dystrophic calcifications in the deep outer left breast, no malignancy.   She had a right breast biopsy on 09/29/16: 1. Breast, right, needle core biopsy, 2:00, 3 cm from nipple - INVASIVE DUCTAL CARCINOMA WITH EXTRACELLULAR MUCIN, SEE COMMENT. 2. Breast, right, needle core biopsy, 2:00, 1 cm from nipple - INVASIVE DUCTAL CARCINOMA WITH EXTRACELLULAR MUCIN, SEE COMMENT. Hormone profile: ER/PR +, HER2 negative.   On 10/28/16, the patient underwent partial mastectomy with axillary sentinel lymph node biopsy with Dr. Arnoldo Morale.  Surgical path: 1. Lymph node, sentinel, biopsy, right axillary - TWO BENIGN LYMPH NODES WITH NO TUMOR SEEN (0/2).  2. Breast, partial mastectomy, right breast tissue - MULTIFOCAL INVASIVE  GRADE 1 DUCTAL CARCINOMA WITH ABUNDANT EXTRACELLULAR MUCIN. - TWO INVASIVE TUMOR FOCI MEASURING 0.7 CM AND 0.6 CM IN GREATEST DIMENSION. - ASSOCIATED INTERMEDIATE GRADE DUCTAL CARCINOMA IN SITU. - MARGINS ARE NEGATIVE. -Hormone profile: ER/PR +, HER2 negative.    She reports she is healing well from this procedure. She reports occasional discomfort in the surgical area. Sh reports occasional headaches. She denies chest pain, shortness of breath except with exertion, labored breathing, or abdominal pain.  Of note, she has a prior history of left-sided breast cancer status post radiation therapy. She denies any immediate family history of cancer.  INTERVAL HISTORY:  [name] 76 y.o. female returns for routine follow-up for right breast cancer. She also has prior history of (L) breast cancer.   Overall, she tells me that she feels "pretty good."  Appetite 75%; energy levels 50%.  Patient states breast pain from previous visit is completely gone.    Remains on Arimidex with great tolerance. Denies any arthralgias, hot flashes, or vaginal dryness.   She has chronic issues with feet and hand swelling; symptoms are largely unchanged.  Complains of bilateral hand peripheral neuropathy that she notes to be progressive over the past few months.  She was given a prescription for 100 mg gabapentin which she has not tried at this point.  She went to fill the medication and was told of the side effects and she decided she did not "need it that bad".  REVIEW OF SYSTEMS:  Review of Systems  Constitutional: Positive for fatigue. Negative for chills and fever.  HENT:  Negative.  Negative for lump/mass and nosebleeds.  Eyes: Negative.   Respiratory: Negative.  Negative for cough and shortness of breath.   Cardiovascular: Positive for leg swelling. Negative for chest pain.  Gastrointestinal: Negative.  Negative for abdominal pain, blood in stool, constipation, diarrhea, nausea and vomiting.    Endocrine: Negative.   Genitourinary: Negative.  Negative for dysuria, hematuria and vaginal bleeding.   Musculoskeletal: Negative.  Negative for arthralgias.  Skin: Negative.  Negative for rash.  Neurological: Positive for numbness (Bilateral hands). Negative for dizziness and headaches.  Hematological: Negative.  Negative for adenopathy. Does not bruise/bleed easily.  Psychiatric/Behavioral: Negative.  Negative for depression and sleep disturbance. The patient is not nervous/anxious.      PAST MEDICAL/SURGICAL HISTORY:  Past Medical History:  Diagnosis Date  . Breast cancer ([facility])   . CKD (chronic kidney disease) stage 2, GFR 60-89 ml/min   . Essential hypertension   . Hyperlipidemia   . Type 2 diabetes mellitus ([facility])    diet controlled   Past Surgical History:  Procedure Laterality Date  . ABDOMINAL HYSTERECTOMY    . BREAST BIOPSY Left    benign  . EYE SURGERY Left    cataract removal  . PARTIAL MASTECTOMY WITH NEEDLE LOCALIZATION AND AXILLARY SENTINEL LYMPH NODE BX Right 10/28/2016   Procedure: PARTIAL MASTECTOMY WITH NEEDLE LOCALIZATION AND AXILLARY SENTINEL LYMPH NODE BX;  Surgeon: Aviva Signs, MD;  Location: [facility];  Service: General;  Laterality: Right;     SOCIAL HISTORY:  Social History   Socioeconomic History  . Marital status: Married    Spouse name: Not on file  . Number of children: Not on file  . Years of education: Not on file  . Highest education level: Not on file  Social Needs  . Financial resource strain: Not on file  . Food insecurity - worry: Not on file  . Food insecurity - inability: Not on file  . Transportation needs - medical: Not on file  . Transportation needs - non-medical: Not on file  Occupational History  . Not on file  Tobacco Use  . Smoking status: Never Smoker  . Smokeless tobacco: Never Used  Substance and Sexual Activity  . Alcohol use: No  . Drug use: No  . Sexual activity: Not Currently    Birth control/protection:  Surgical  Other Topics Concern  . Not on file  Social History Narrative  . Not on file    FAMILY HISTORY:  Family History  Problem Relation Age of Onset  . Hypertension Mother   . Hypertension Father   . Diabetes Mellitus II Sister   . Diabetes Mellitus II Brother   . Diabetes Mellitus II Brother     CURRENT MEDICATIONS:  Outpatient Encounter Medications as of 08/30/2017  Medication Sig Note  . anastrozole (ARIMIDEX) 1 MG tablet Take 1 tablet (1 mg total) by mouth daily.   Marland Kitchen aspirin EC 81 MG tablet Take 1 tablet (81 mg total) by mouth daily. 10/16/2016: No specific time of day  . Bilberry, Vaccinium myrtillus, (BILBERRY PO) Take 1,000 mg by mouth daily as needed (for eye health.).   Marland Kitchen calcium-vitamin D (OSCAL WITH D) 500-200 MG-UNIT tablet Take 2 tablets by mouth daily with breakfast.   . cetirizine-pseudoephedrine (ZYRTEC-D) 5-120 MG tablet Take 1 tablet by mouth daily.   Marland Kitchen diltiazem (TIAZAC) 120 MG 24 hr capsule Take 120 mg by mouth daily.   . fluticasone (FLONASE) 50 MCG/ACT nasal spray Place 2 sprays into both nostrils daily.   Marland Kitchen HYDROcodone-acetaminophen (NORCO) 5-325 MG tablet  Take 1 tablet by mouth every 6 (six) hours as needed for moderate pain.   . Menthol-Camphor (TIGER BALM ARTHRITIS RUB) 11-11 % CREA Apply 1 application topically 3 (three) times daily as needed (for pain (back)).     No facility-administered encounter medications on file as of 08/30/2017.     ALLERGIES:  No Known Allergies   PHYSICAL EXAM:  ECOG Performance status: 1 - Symptomatic; remains independent.   Vitals:   08/30/17 1339  BP: (!) 171/76  Pulse: 86  Resp: 18  Temp: 98.9 F (37.2 C)  SpO2: 97%   Filed Weights   08/30/17 1339  Weight: 187 lb 12.8 oz (85.2 kg)    Physical Exam  Constitutional: She is oriented to person, place, and time and well-developed, well-nourished, and in no distress.  HENT:  Head: Normocephalic.  Mouth/Throat: Oropharynx is clear and moist. No  oropharyngeal exudate.  Eyes: Conjunctivae are normal. Pupils are equal, round, and reactive to light. No scleral icterus.  Neck: Normal range of motion. Neck supple.  Cardiovascular: Normal rate and regular rhythm.  Pulmonary/Chest: Effort normal and breath sounds normal. No respiratory distress.    Abdominal: Soft. Bowel sounds are normal. There is no tenderness.  Musculoskeletal: Normal range of motion. She exhibits edema (Trace ankle edema).  Lymphadenopathy:    She has no cervical adenopathy.       Right: No supraclavicular adenopathy present.       Left: No supraclavicular adenopathy present.  Neurological: She is alert and oriented to person, place, and time. No cranial nerve deficit. Gait normal.  Skin: Skin is warm and dry. No rash noted.  Psychiatric: Mood, memory, affect and judgment normal.  Nursing note and vitals reviewed.    LABORATORY DATA:  I have reviewed the labs as listed.  CBC    Component Value Date/Time   WBC 6.3 12/30/2016 1403   RBC 4.22 12/30/2016 1403   HGB 12.1 12/30/2016 1403   HCT 38.3 12/30/2016 1403   PLT 276 12/30/2016 1403   MCV 90.8 12/30/2016 1403   MCH 28.7 12/30/2016 1403   MCHC 31.6 12/30/2016 1403   RDW 12.5 12/30/2016 1403   LYMPHSABS 1.4 12/30/2016 1403   MONOABS 0.4 12/30/2016 1403   EOSABS 0.1 12/30/2016 1403   BASOSABS 0.0 12/30/2016 1403   CMP Latest Ref Rng & Units 12/30/2016 10/22/2016 02/08/2016  Glucose 65 - 99 mg/dL 159(H) 171(H) 212(H)  BUN 6 - 20 mg/dL 17 14 22(H)  Creatinine 0.44 - 1.00 mg/dL 1.28(H) 1.18(H) 1.30(H)  Sodium 135 - 145 mmol/L 139 139 137  Potassium 3.5 - 5.1 mmol/L 4.2 3.9 3.9  Chloride 101 - 111 mmol/L 102 104 104  CO2 22 - 32 mmol/L '28 26 26  ' Calcium 8.9 - 10.3 mg/dL 9.8 9.1 9.0  Total Protein 6.5 - 8.1 g/dL 7.7 7.0 -  Total Bilirubin 0.3 - 1.2 mg/dL 0.5 0.5 -  Alkaline Phos 38 - 126 U/L 77 71 -  AST 15 - 41 U/L 24 23 -  ALT 14 - 54 U/L 18 18 -    PENDING LABS:    DIAGNOSTIC IMAGING:  *The  following radiologic images and reports have been reviewed independently and agree with below findings.  DEXA scan: 04/12/17 EXAM: DUAL X-RAY ABSORPTIOMETRY (DXA) FOR BONE MINERAL DENSITY  IMPRESSION: Ordering Physician:  Dr. Twana First,  Your patient Kita Neace completed a BMD test on 04/12/2017 using the [place] (software version: 14.10) manufactured by [employer]. The following  summarizes the results of our evaluation. PATIENT BIOGRAPHICAL: Name: [name], [name] Patient ID: [id number] Birth Date: 01/08/1941 Height: 63.5 in. Gender: Female Exam Date: 04/12/2017 Weight: 190.0 lbs. Indications: Hx Breast Ca, Partial Hysterectomy, Post Menopausal, Unilateral oophrectomy Fractures: Treatments: Asprin, Calcium, Vitamin D DENSITOMETRY RESULTS: Site         Region      Measured Date Measured Age WHO Classification Young Adult T-score BMD         %Change vs. Previous Significant Change (*) DualFemur Total Right 04/12/2017 77.3 Normal -0.6 0.936 g/cm2 -0.7% - DualFemur Total Right 3/10/2015 75.2 Normal -0.5 0.943 g/cm2 -1.3% - DualFemur Total Right [birth year]29/2011 71.1 Normal -0.4 0.955 g/cm2 - -  Left Forearm Radius 33% 04/12/2017 77.3 Normal 1.2 0.801 g/cm2 1.2% - Left Forearm Radius 33% 04/10/2015 75.2 Normal 1.1 0.791 g/cm2 - - ASSESSMENT: BMD as determined from Femur Total Right is 0.936 g/cm2 with a T-Score of -0.6. This patient is considered normal according to [facility]) criteria. Compared with the prior study on 04/10/2015, the BMD of the lt. forearm/rt. total hip show no statistically significant change. (Lumbar spine was not utilized due to advanced degenerative changes.)    PATHOLOGY:  (R) lumpectomy surgical path: 10/28/16         ASSESSMENT & PLAN:   Stage IA invasive ductal carcinoma of (R) breast; ER+/PR+/HER2-:  -Diagnosed in 08/2016 after abnormal screening mammogram. Treated with (R)  lumpectomy with SLNB with Dr. Arnoldo Morale on 10/28/16. Dr. Talbert Cage did not think patient would benefit from adjuvant radiation therapy and thus she was started on anti-estrogen therapy with Arimidex in 11/2016.   -Remains on Arimidex with good tolerance. Will plan to continue for 5-10 years.  -Clinical breast exam performed today and benign.  Patient does not complain of sharp intermittent breast pains as she did last visit.  They have since resolved.  No evidence of seroma from previous examination. -Mammogram due 08/2017; orders placed on last visit in August. Patient was supposed to be seen after mammogram but  mammogram was rescheduled.  We will have her return in 4 months for follow-up.  Bone health:  -Baseline DEXA scan done on 04/12/17 revealed normal bone density with T-score -0.8.  -She understands that aromatase inhibitor therapy can decrease bone density over time. Encouraged her to take calcium/vitamin D supplementation and increase weight-bearing activities as tolerated.   -Biennial DEXA scan will be due in 04/2019; will place orders at subsequent follow-up visits.   Dispo:  -Mammogram scheduled for 10/26/16.  -Return to cancer center in 4 months for follow-up and results of mammogram.  All questions were answered to patient's stated satisfaction. Encouraged patient to call with any new concerns or questions before her next visit to the cancer center and we can certain see her sooner, if needed.    Orders placed this encounter:  No orders of the defined types were placed in this encounter.   Faythe Casa, NP 08/30/2017 2:25 PM

## 2017-10-13 ENCOUNTER — Other Ambulatory Visit: Payer: Self-pay | Admitting: Adult Health

## 2017-10-13 DIAGNOSIS — R928 Other abnormal and inconclusive findings on diagnostic imaging of breast: Secondary | ICD-10-CM

## 2017-10-26 ENCOUNTER — Ambulatory Visit (HOSPITAL_COMMUNITY)
Admission: RE | Admit: 2017-10-26 | Discharge: 2017-10-26 | Disposition: A | Discharge status: Home / Self Care | Payer: Medicare HMO | Source: Ambulatory Visit | Attending: Adult Health | Admitting: Adult Health

## 2017-10-26 DIAGNOSIS — Z170 Estrogen receptor positive status [ER+]: Secondary | ICD-10-CM | POA: Diagnosis not present

## 2017-10-26 DIAGNOSIS — Z853 Personal history of malignant neoplasm of breast: Secondary | ICD-10-CM

## 2017-10-26 DIAGNOSIS — R928 Other abnormal and inconclusive findings on diagnostic imaging of breast: Secondary | ICD-10-CM | POA: Diagnosis not present

## 2017-10-26 DIAGNOSIS — C50211 Malignant neoplasm of upper-inner quadrant of right female breast: Secondary | ICD-10-CM | POA: Diagnosis not present

## 2017-11-02 DIAGNOSIS — I110 Hypertensive heart disease with heart failure: Secondary | ICD-10-CM | POA: Diagnosis not present

## 2017-11-02 DIAGNOSIS — E669 Obesity, unspecified: Secondary | ICD-10-CM | POA: Diagnosis not present

## 2017-11-02 DIAGNOSIS — M353 Polymyalgia rheumatica: Secondary | ICD-10-CM | POA: Diagnosis not present

## 2017-11-02 DIAGNOSIS — E6609 Other obesity due to excess calories: Secondary | ICD-10-CM | POA: Diagnosis not present

## 2017-11-02 DIAGNOSIS — E1129 Type 2 diabetes mellitus with other diabetic kidney complication: Secondary | ICD-10-CM | POA: Diagnosis not present

## 2017-11-02 DIAGNOSIS — Z6833 Body mass index (BMI) 33.0-33.9, adult: Secondary | ICD-10-CM | POA: Diagnosis not present

## 2017-11-02 DIAGNOSIS — R252 Cramp and spasm: Secondary | ICD-10-CM | POA: Diagnosis not present

## 2017-11-02 DIAGNOSIS — Z1389 Encounter for screening for other disorder: Secondary | ICD-10-CM | POA: Diagnosis not present

## 2017-11-02 DIAGNOSIS — C50211 Malignant neoplasm of upper-inner quadrant of right female breast: Secondary | ICD-10-CM | POA: Diagnosis not present

## 2017-12-14 ENCOUNTER — Other Ambulatory Visit (HOSPITAL_COMMUNITY): Payer: Self-pay

## 2017-12-14 DIAGNOSIS — E119 Type 2 diabetes mellitus without complications: Secondary | ICD-10-CM | POA: Diagnosis not present

## 2017-12-14 DIAGNOSIS — Z170 Estrogen receptor positive status [ER+]: Principal | ICD-10-CM

## 2017-12-14 DIAGNOSIS — C50211 Malignant neoplasm of upper-inner quadrant of right female breast: Secondary | ICD-10-CM

## 2017-12-14 MED ORDER — ANASTROZOLE 1 MG PO TABS: 1.0000 mg | ORAL_TABLET | Freq: Every day | ORAL | 3 refills | Status: DC

## 2017-12-14 NOTE — Telephone Encounter (Signed)
Received refill request from patient for Arimidex. Reviewed with provider, chart checked and refilled.

## 2017-12-20 ENCOUNTER — Encounter (HOSPITAL_COMMUNITY): Payer: Self-pay | Admitting: *Deleted

## 2017-12-20 ENCOUNTER — Other Ambulatory Visit: Payer: Self-pay

## 2017-12-20 ENCOUNTER — Emergency Department (HOSPITAL_COMMUNITY): Payer: Medicare HMO

## 2017-12-20 ENCOUNTER — Emergency Department (HOSPITAL_COMMUNITY)
Admission: EM | Admit: 2017-12-20 | Discharge: 2017-12-20 | Disposition: A | Discharge status: Home / Self Care | Payer: Medicare HMO | Attending: Emergency Medicine | Admitting: Emergency Medicine

## 2017-12-20 DIAGNOSIS — E1122 Type 2 diabetes mellitus with diabetic chronic kidney disease: Secondary | ICD-10-CM | POA: Insufficient documentation

## 2017-12-20 DIAGNOSIS — Z853 Personal history of malignant neoplasm of breast: Secondary | ICD-10-CM | POA: Diagnosis not present

## 2017-12-20 DIAGNOSIS — M546 Pain in thoracic spine: Secondary | ICD-10-CM | POA: Diagnosis not present

## 2017-12-20 DIAGNOSIS — N182 Chronic kidney disease, stage 2 (mild): Secondary | ICD-10-CM | POA: Insufficient documentation

## 2017-12-20 DIAGNOSIS — R079 Chest pain, unspecified: Secondary | ICD-10-CM | POA: Diagnosis not present

## 2017-12-20 DIAGNOSIS — I129 Hypertensive chronic kidney disease with stage 1 through stage 4 chronic kidney disease, or unspecified chronic kidney disease: Secondary | ICD-10-CM | POA: Insufficient documentation

## 2017-12-20 DIAGNOSIS — R0602 Shortness of breath: Secondary | ICD-10-CM | POA: Diagnosis not present

## 2017-12-20 DIAGNOSIS — Z79899 Other long term (current) drug therapy: Secondary | ICD-10-CM | POA: Insufficient documentation

## 2017-12-20 DIAGNOSIS — Z7982 Long term (current) use of aspirin: Secondary | ICD-10-CM | POA: Diagnosis not present

## 2017-12-20 DIAGNOSIS — Z1389 Encounter for screening for other disorder: Secondary | ICD-10-CM | POA: Diagnosis not present

## 2017-12-20 DIAGNOSIS — R0789 Other chest pain: Secondary | ICD-10-CM | POA: Diagnosis not present

## 2017-12-20 DIAGNOSIS — E6609 Other obesity due to excess calories: Secondary | ICD-10-CM | POA: Diagnosis not present

## 2017-12-20 DIAGNOSIS — Z6833 Body mass index (BMI) 33.0-33.9, adult: Secondary | ICD-10-CM | POA: Diagnosis not present

## 2017-12-20 LAB — CBC
HCT: 38.6 % (ref 36.0–46.0)
Hemoglobin: 11.9 g/dL — ABNORMAL LOW (ref 12.0–15.0)
MCH: 27.9 pg (ref 26.0–34.0)
MCHC: 30.8 g/dL (ref 30.0–36.0)
MCV: 90.6 fL (ref 78.0–100.0)
Platelets: 273 10*3/uL (ref 150–400)
RBC: 4.26 MIL/uL (ref 3.87–5.11)
RDW: 12.9 % (ref 11.5–15.5)
WBC: 5.8 10*3/uL (ref 4.0–10.5)

## 2017-12-20 LAB — BASIC METABOLIC PANEL
Anion gap: 10 (ref 5–15)
BUN: 17 mg/dL (ref 6–20)
CO2: 28 mmol/L (ref 22–32)
Calcium: 9.9 mg/dL (ref 8.9–10.3)
Chloride: 100 mmol/L — ABNORMAL LOW (ref 101–111)
Creatinine, Ser: 1.21 mg/dL — ABNORMAL HIGH (ref 0.44–1.00)
GFR calc Af Amer: 49 mL/min — ABNORMAL LOW (ref >60)
GFR calc non Af Amer: 42 mL/min — ABNORMAL LOW (ref >60)
Glucose, Bld: 206 mg/dL — ABNORMAL HIGH (ref 65–99)
Potassium: 4.2 mmol/L (ref 3.5–5.1)
Sodium: 138 mmol/L (ref 135–145)

## 2017-12-20 LAB — I-STAT TROPONIN, ED
Troponin i, poc: 0 ng/mL (ref 0.00–0.08)
Troponin i, poc: 0.01 ng/mL (ref 0.00–0.08)

## 2017-12-20 MED ORDER — NITROGLYCERIN 0.4 MG SL SUBL
0.4000 mg | SUBLINGUAL_TABLET | SUBLINGUAL | Status: DC | PRN
  Administered 2017-12-20: 0.4 mg via SUBLINGUAL
  Filled 2017-12-20: qty 1

## 2017-12-20 MED ORDER — IOPAMIDOL (ISOVUE-370) INJECTION 76%
100.0000 mL | Freq: Once | INTRAVENOUS | Status: AC | PRN
  Administered 2017-12-20: 75 mL via INTRAVENOUS

## 2017-12-20 MED ORDER — ASPIRIN 81 MG PO CHEW
324.0000 mg | CHEWABLE_TABLET | Freq: Once | ORAL | Status: AC
  Administered 2017-12-20: 324 mg via ORAL
  Filled 2017-12-20: qty 4

## 2017-12-20 NOTE — ED Provider Notes (Signed)
[facility] EMERGENCY DEPARTMENT Provider Note   CSN: [id number] Arrival date & time: 12/20/17  1229     History   Chief Complaint Chief Complaint  Patient presents with  . Chest Pain    HPI [name] is a 77 y.o. female.  She presents today with chest discomfort radiating into her neck and her upper back for about 2 to 3 days.  It does not appear to be made worse or better with anything.  The chest discomfort she rates as a 6 out of 10 on the upper back discomfort as an 8 out of 10.  It waxes and wanes but does not totally resolve.  She tried an aspirin with some possible relief.  Today the pain increased and she went to her primary care's office where she was seen by a new physician there who did an EKG and referred her to the ER for further work-up.  She is never had this before.  It is associated with a little bit of short of breath but not associated with any nausea vomiting sweats dizziness lightheadedness.  There is been no medication changes or different activities that she is been doing.  The history is provided by the patient.  Chest Pain   This is a new problem. The current episode started more than 2 days ago. The problem occurs constantly. The problem has not changed since onset.Pain location: upper chest into back. The pain is at a severity of 6/10. Quality: ache. The pain radiates to the upper back, left neck and right neck. Associated symptoms include back pain and shortness of breath. Pertinent negatives include no abdominal pain, no cough, no diaphoresis, no dizziness, no fever, no headaches, no hemoptysis, no irregular heartbeat, no leg pain, no nausea, no near-syncope, no numbness, no palpitations, no sputum production, no syncope, no vomiting and no weakness. She has tried nothing for the symptoms. The treatment provided no relief. Risk factors include being elderly and post-menopausal.  Her past medical history is significant for cancer, diabetes, hyperlipidemia  and hypertension.  Pertinent negatives for past medical history include no seizures.    Past Medical History:  Diagnosis Date  . Breast cancer ([place])   . CKD (chronic kidney disease) stage 2, GFR 60-89 ml/min   . Essential hypertension   . Hyperlipidemia   . Type 2 diabetes mellitus ([place])    diet controlled    Patient Active Problem List   Diagnosis Date Noted  . Post-menopausal 12/07/2016  . Malignant neoplasm of upper-inner quadrant of right female breast ([place])   . Chest pain, rule out acute myocardial infarction 06/03/2014  . Chest pain 06/03/2014  . Essential hypertension 06/03/2014  . Hyperglycemia 06/03/2014  . Hyponatremia 06/03/2014    Past Surgical History:  Procedure Laterality Date  . ABDOMINAL HYSTERECTOMY    . BREAST BIOPSY Left    benign  . EYE SURGERY Left    cataract removal  . PARTIAL MASTECTOMY WITH NEEDLE LOCALIZATION AND AXILLARY SENTINEL LYMPH NODE BX Right 10/28/2016   Procedure: PARTIAL MASTECTOMY WITH NEEDLE LOCALIZATION AND AXILLARY SENTINEL LYMPH NODE BX;  Surgeon: Aviva Signs, MD;  Location: [facility];  Service: General;  Laterality: Right;     OB History    Gravida  3   Para  3   Term  3   Preterm      AB      Living        SAB      TAB  Ectopic      Multiple      Live Births               Home Medications    Prior to Admission medications   Medication Sig Start Date End Date Taking? Authorizing Provider  anastrozole (ARIMIDEX) 1 MG tablet Take 1 tablet (1 mg total) by mouth daily. 12/14/17   Holley Bouche, NP  aspirin EC 81 MG tablet Take 1 tablet (81 mg total) by mouth daily. 06/03/14   Samuella Cota, MD  Bilberry, Vaccinium myrtillus, (BILBERRY PO) Take 1,000 mg by mouth daily as needed (for eye health.).    [provider]  calcium-vitamin D (OSCAL WITH D) 500-200 MG-UNIT tablet Take 2 tablets by mouth daily with breakfast. 12/07/16   Twana First, MD  cetirizine-pseudoephedrine (ZYRTEC-D) 5-120  MG tablet Take 1 tablet by mouth daily. 04/06/17   Tasia Catchings, Amy V, PA-C  diltiazem (TIAZAC) 120 MG 24 hr capsule Take 120 mg by mouth daily. 09/07/16   [provider]  fluticasone (FLONASE) 50 MCG/ACT nasal spray Place 2 sprays into both nostrils daily. 04/06/17   Ok Edwards, PA-C  HYDROcodone-acetaminophen (NORCO) 5-325 MG tablet Take 1 tablet by mouth every 6 (six) hours as needed for moderate pain. 10/28/16   Aviva Signs, MD  Menthol-Camphor (TIGER BALM ARTHRITIS RUB) 11-11 % CREA Apply 1 application topically 3 (three) times daily as needed (for pain (back)).     [provider]    Family History Family History  Problem Relation Age of Onset  . Hypertension Mother   . Hypertension Father   . Diabetes Mellitus II Sister   . Diabetes Mellitus II Brother   . Diabetes Mellitus II Brother     Social History Social History   Tobacco Use  . Smoking status: Never Smoker  . Smokeless tobacco: Never Used  Substance Use Topics  . Alcohol use: No  . Drug use: No     Allergies   Patient has no known allergies.   Review of Systems Review of Systems  Constitutional: Negative for chills, diaphoresis and fever.  HENT: Negative for ear pain and sore throat.   Eyes: Negative for pain and visual disturbance.  Respiratory: Positive for shortness of breath. Negative for cough, hemoptysis and sputum production.   Cardiovascular: Positive for chest pain. Negative for palpitations, syncope and near-syncope.  Gastrointestinal: Negative for abdominal pain, nausea and vomiting.  Genitourinary: Negative for dysuria and hematuria.  Musculoskeletal: Positive for back pain. Negative for arthralgias.  Skin: Negative for color change and rash.  Neurological: Negative for dizziness, seizures, syncope, weakness, numbness and headaches.  All other systems reviewed and are negative.    Physical Exam Updated Vital Signs BP (!) 180/83   Pulse 74   Temp 98.2 F (36.8 C) (Tympanic)   Resp 19    Ht 5\' 3"  (1.6 m)   Wt 85.7 kg (189 lb)   SpO2 99%   BMI 33.48 kg/m   Physical Exam  Constitutional: She appears well-developed and well-nourished. No distress.  HENT:  Head: Normocephalic and atraumatic.  Eyes: Conjunctivae are normal.  Neck: Neck supple.  Cardiovascular: Normal rate and regular rhythm.  No murmur heard. Pulmonary/Chest: Effort normal and breath sounds normal. No respiratory distress.  Abdominal: Soft. There is no tenderness.  Musculoskeletal: She exhibits no edema.       Right lower leg: Normal. She exhibits no tenderness.       Left lower leg: Normal. She exhibits  no tenderness.  Neurological: She is alert.  Skin: Skin is warm and dry.  Psychiatric: She has a normal mood and affect.  Nursing note and vitals reviewed.    ED Treatments / Results  Labs (all labs ordered are listed, but only abnormal results are displayed) Labs Reviewed  BASIC METABOLIC PANEL - Abnormal; Notable for the following components:      Result Value   Chloride 100 (*)    Glucose, Bld 206 (*)    Creatinine, Ser 1.21 (*)    GFR calc non Af Amer 42 (*)    GFR calc Af Amer 49 (*)    All other components within normal limits  CBC - Abnormal; Notable for the following components:   Hemoglobin 11.9 (*)    All other components within normal limits  I-STAT TROPONIN, ED  I-STAT TROPONIN, ED    EKG EKG Interpretation  Date/Time:  Monday December 20 2017 12:34:03 EDT Ventricular Rate:  72 PR Interval:  184 QRS Duration: 104 QT Interval:  376 QTC Calculation: 411 R Axis:   -64 Text Interpretation:  Normal sinus rhythm Left axis deviation Septal infarct , age undetermined Abnormal ECG similar to 7/09 Confirmed by Aletta Edouard [phone]) on 12/20/2017 1:02:50 PM   Radiology Dg Chest 2 View  Result Date: 12/20/2017 CLINICAL DATA:  Chest pain.  Shortness of breath. EXAM: CHEST - 2 VIEW COMPARISON:  October 22, 2016. FINDINGS: Lungs are clear. The heart size and pulmonary  vascularity are normal. No adenopathy. There is degenerative change in each shoulder as well as in the thoracic spine. No pneumothorax. IMPRESSION: No edema or consolidation. Electronically Signed   By: Lowella Grip III M.D.   On: 12/20/2017 13:27   Ct Angio Chest Pe W/cm &/or Wo Cm  Result Date: 12/20/2017 CLINICAL DATA:  Chest and upper back pain. EXAM: CT ANGIOGRAPHY CHEST WITH CONTRAST TECHNIQUE: Multidetector CT imaging of the chest was performed using the standard protocol during bolus administration of intravenous contrast. Multiplanar CT image reconstructions and MIPs were obtained to evaluate the vascular anatomy. CONTRAST:  42mL ISOVUE-370 IOPAMIDOL (ISOVUE-370) INJECTION 76% COMPARISON:  Prior chest x-rays including 12/20/2017 FINDINGS: Cardiovascular: Satisfactory opacification of the pulmonary arteries to the segmental level. No evidence of pulmonary embolism. The thoracic aorta is also well opacified and shows normal caliber without evidence of dissection or significant atherosclerosis. Proximal great vessels are normally patent. Normal heart size. No pericardial effusion. Mediastinum/Nodes: No enlarged mediastinal, hilar, or axillary lymph nodes. Thyroid gland, trachea, and esophagus demonstrate no significant findings. Lungs/Pleura: There are multiple bilateral partially calcified pleural plaques consistent with prior asbestos exposure. No obvious pleural tumor or pleural effusions. There is no evidence of significant chronic lung disease, pulmonary edema, consolidation, pneumothorax or pulmonary nodule. Upper Abdomen: No acute abnormality. Musculoskeletal: No bony lesions or fractures identified. Review of the MIP images confirms the above findings. IMPRESSION: 1. No evidence of pulmonary embolism or other acute findings in the chest. 2. Multiple bilateral partially calcified pleural plaques are consistent with prior asbestos exposure. No evidence of associated asbestosis, pleural tumor or  other pulmonary abnormalities. Electronically Signed   By: Aletta Edouard M.D.   On: 12/20/2017 14:50    Procedures Procedures (including critical care time)  Medications Ordered in ED Medications  aspirin chewable tablet 324 mg (has no administration in time range)  nitroGLYCERIN (NITROSTAT) SL tablet 0.4 mg (has no administration in time range)     Initial Impression / Assessment and Plan / ED Course  I  have reviewed the triage vital signs and the nursing notes.  Pertinent labs & imaging results that were available during my care of the patient were reviewed by me and considered in my medical decision making (see chart for details).  Clinical Course as of Dec 22 1115  Mon Dec 20, 2017  1303 Differential diagnosis includes myocardial ischemia, PE, aortic dissection, pneumothorax, pneumonia, GERD.   [MB]  3888 By heart score is at least moderate at 4.  Patient is not really interested in staying or at least getting a second troponin.   [MB]  2800 Second troponin negative and patient having no further symptoms.  She is comfortable following up with her primary care doctor for further evaluation.   [MB]    Clinical Course User Index [MB] Hayden Rasmussen, MD    Final Clinical Impressions(s) / ED Diagnoses   Final diagnoses:  Nonspecific chest pain    ED Discharge Orders    None       Hayden Rasmussen, MD 12/21/17 1118

## 2017-12-20 NOTE — Discharge Instructions (Signed)
You are evaluated in the emergency department for some chest and upper back pain.  We ran some testing did not find an obvious cause of your pain.  This still could be related to your heart and you should take an aspirin daily.  Please call your doctor tomorrow for reevaluation and have them set you up for further testing as needed.  If your symptoms recur he should return to the emergency department.

## 2017-12-20 NOTE — ED Triage Notes (Signed)
Pt c/o mid chest pain that radiates to the back x couple of days. Pt also reports some SOB with the pain. Denies n/v, dizziness.

## 2017-12-20 NOTE — ED Notes (Signed)
Patient transported to CT 

## 2017-12-28 ENCOUNTER — Ambulatory Visit (HOSPITAL_COMMUNITY): Payer: Medicare HMO | Admitting: Internal Medicine

## 2018-01-03 ENCOUNTER — Encounter (HOSPITAL_COMMUNITY): Payer: Self-pay | Admitting: Internal Medicine

## 2018-01-03 ENCOUNTER — Inpatient Hospital Stay (HOSPITAL_COMMUNITY): Payer: Medicare HMO | Attending: Hematology | Admitting: Internal Medicine

## 2018-01-03 ENCOUNTER — Other Ambulatory Visit: Payer: Self-pay

## 2018-01-03 VITALS — BP 171/75 | HR 68 | Temp 97.9°F | Resp 16 | Wt 187.4 lb

## 2018-01-03 DIAGNOSIS — Z79811 Long term (current) use of aromatase inhibitors: Secondary | ICD-10-CM | POA: Diagnosis not present

## 2018-01-03 DIAGNOSIS — C50211 Malignant neoplasm of upper-inner quadrant of right female breast: Secondary | ICD-10-CM | POA: Diagnosis not present

## 2018-01-03 DIAGNOSIS — Z170 Estrogen receptor positive status [ER+]: Secondary | ICD-10-CM | POA: Diagnosis not present

## 2018-01-03 NOTE — Progress Notes (Signed)
Diagnosis Malignant neoplasm of upper-inner quadrant of right breast in female, estrogen receptor positive ([place]) - Plan: CBC with Differential/Platelet, Comprehensive metabolic panel, Lactate dehydrogenase  Staging Cancer Staging Malignant neoplasm of upper-inner quadrant of right female breast ([place]) Staging form: Breast, AJCC 8th Edition - Clinical: Stage IA (cT1b, cN0(sn), cM0, G1, ER: Positive, PR: Positive, HER2: Negative) - Signed by Twana First, MD on 12/07/2016   Assessment and Plan:  1.  Stage IA invasive ductal carcinoma of (R) breast; ER+/PR+/HER2-.  Pt was diiagnosed in 08/2016 after abnormal screening mammogram.  She was followed by Dr. Talbert Cage.  She was treated with (R) lumpectomy with SLNB with Dr. Arnoldo Morale on 10/28/16. Dr. Talbert Cage did not think patient would benefit from adjuvant radiation therapy and thus she was started on anti-estrogen therapy with Arimidex in 11/2016. She is planned for 5-10 years of therapy. Pt has option of BCI testing which will be revisited at next visit.    Recent bilateral diagnostic mammogram done 10/26/2017 was negative other than lumpectomy changes  and pt is recommended for bilateral diagnostic mammogram in 10/2018.  She will RTC in 07/2018 for follow-up and labs.       2.  Pulmonary lesions. Pt underwent CTA due to chest and back pain on 12/20/2017 in the ER  that showed  IMPRESSION: 1. No evidence of pulmonary embolism or other acute findings in the chest. 2. Multiple bilateral partially calcified pleural plaques are consistent with prior asbestos exposure. No evidence of associated asbestosis, pleural tumor or other pulmonary abnormalities.  She denies any history of smoking.  She will be referred to pulmonary for evaluation and to determine if pt needs further workup.     3.  HTN:  BP is 171/75.  Follow-up with PCP.    4.  Health maintenance.  Follow-up with GI as recommended.  Last BMD done 04/12/2017 was normal.  She will have repeat BMD in 04/2019.   Continue calcium and vitamin D.    Current Status:  Pt is seen today for follow-up.  She reports she went to ER for breathing concerns in April.    Problem List Patient Active Problem List   Diagnosis Date Noted  . Post-menopausal [Z78.0] 12/07/2016  . Malignant neoplasm of upper-inner quadrant of right female breast ([place]) [C50.211]   . Chest pain, rule out acute myocardial infarction [R07.9] 06/03/2014  . Chest pain [R07.9] 06/03/2014  . Essential hypertension [I10] 06/03/2014  . Hyperglycemia [R73.9] 06/03/2014  . Hyponatremia [E87.1] 06/03/2014    Past Medical History Past Medical History:  Diagnosis Date  . Breast cancer ([place])   . CKD (chronic kidney disease) stage 2, GFR 60-89 ml/min   . Essential hypertension   . Hyperlipidemia   . Type 2 diabetes mellitus ([facility])    diet controlled    Past Surgical History Past Surgical History:  Procedure Laterality Date  . ABDOMINAL HYSTERECTOMY    . BREAST BIOPSY Left    benign  . EYE SURGERY Left    cataract removal  . PARTIAL MASTECTOMY WITH NEEDLE LOCALIZATION AND AXILLARY SENTINEL LYMPH NODE BX Right 10/28/2016   Procedure: PARTIAL MASTECTOMY WITH NEEDLE LOCALIZATION AND AXILLARY SENTINEL LYMPH NODE BX;  Surgeon: Aviva Signs, MD;  Location: [facility];  Service: General;  Laterality: Right;    Family History Family History  Problem Relation Age of Onset  . Hypertension Mother   . Hypertension Father   . Diabetes Mellitus II Sister   . Diabetes Mellitus II Brother   . Diabetes Mellitus  II Brother      Social History  reports that she has never smoked. She has never used smokeless tobacco. She reports that she does not drink alcohol or use drugs.  Medications  Current Outpatient Medications:  .  anastrozole (ARIMIDEX) 1 MG tablet, Take 1 tablet (1 mg total) by mouth daily., Disp: 90 tablet, Rfl: 3 .  aspirin EC 81 MG tablet, Take 1 tablet (81 mg total) by mouth daily., Disp: , Rfl:  .  Bilberry, Vaccinium myrtillus,  (BILBERRY PO), Take 1,000 mg by mouth daily as needed (for eye health.)., Disp: , Rfl:  .  calcium-vitamin D (OSCAL WITH D) 500-200 MG-UNIT tablet, Take 2 tablets by mouth daily with breakfast., Disp: 60 tablet, Rfl: 6 .  diltiazem (TIAZAC) 120 MG 24 hr capsule, Take 120 mg by mouth daily., Disp: , Rfl: 1 .  Menthol-Camphor (TIGER BALM ARTHRITIS RUB) 11-11 % CREA, Apply 1 application topically 3 (three) times daily as needed (for pain (back)). , Disp: , Rfl:   Allergies Patient has no known allergies.  Review of Systems Review of Systems - Oncology ROS as per HPI otherwise 12 point ROS is negative.   Physical Exam  Vitals Wt Readings from Last 3 Encounters:  12/20/17 189 lb (85.7 kg)  08/30/17 187 lb 12.8 oz (85.2 kg)  04/14/17 187 lb (84.8 kg)   Temp Readings from Last 3 Encounters:  12/20/17 98.2 F (36.8 C) (Tympanic)  08/30/17 98.9 F (37.2 C) (Oral)  04/06/17 98.3 F (36.8 C) (Oral)   BP Readings from Last 3 Encounters:  12/20/17 (!) 154/86  08/30/17 (!) 171/76  04/14/17 (!) 174/80   Pulse Readings from Last 3 Encounters:  12/20/17 66  08/30/17 86  04/14/17 80   Constitutional: Well-developed, well-nourished, and in no distress.   HENT: Head: Normocephalic and atraumatic.  Mouth/Throat: No oropharyngeal exudate. Mucosa moist. Eyes: Pupils are equal, round, and reactive to light. Conjunctivae are normal. No scleral icterus.  Neck: Normal range of motion. Neck supple. No JVD present.  Cardiovascular: Normal rate, regular rhythm and normal heart sounds.  Exam reveals no gallop and no friction rub.   No murmur heard. Pulmonary/Chest: Effort normal and breath sounds normal. No respiratory distress. No wheezes.No rales.  Abdominal: Soft. Bowel sounds are normal. No distension. There is no tenderness. There is no guarding.  Musculoskeletal: No edema or tenderness.  Lymphadenopathy: No cervical, axillary or supraclavicular adenopathy.  Neurological: Alert and oriented  to person, place, and time. No cranial nerve deficit.  Skin: Skin is warm and dry. No rash noted. No erythema. No pallor.  Psychiatric: Affect and judgment normal.  Bilateral breast exam:  Chaperone present.  Right lumpectomy.  No dominant masses noted bilaterally.    Labs No visits with results within 3 Day(s) from this visit.  Latest known visit with results is:  Admission on 12/20/2017, Discharged on 12/20/2017  Component Date Value Ref Range Status  . Sodium 12/20/2017 138  135 - 145 mmol/L Final  . Potassium 12/20/2017 4.2  3.5 - 5.1 mmol/L Final  . Chloride 12/20/2017 100* 101 - 111 mmol/L Final  . CO2 12/20/2017 28  22 - 32 mmol/L Final  . Glucose, Bld 12/20/2017 206* 65 - 99 mg/dL Final  . BUN 12/20/2017 17  6 - 20 mg/dL Final  . Creatinine, Ser 12/20/2017 1.21* 0.44 - 1.00 mg/dL Final  . Calcium 12/20/2017 9.9  8.9 - 10.3 mg/dL Final  . GFR calc non Af Amer 12/20/2017 42* >60 mL/min Final  .  GFR calc Af Amer 12/20/2017 49* >60 mL/min Final   Comment: (NOTE) The eGFR has been calculated using the CKD EPI equation. This calculation has not been validated in all clinical situations. eGFR's persistently <60 mL/min signify possible Chronic Kidney Disease.   Georgiann Hahn gap 12/20/2017 10  5 - 15 Final   Performed at [facility], [address], [place], [place] [address]  . WBC 12/20/2017 5.8  4.0 - 10.5 K/uL Final  . RBC 12/20/2017 4.26  3.87 - 5.11 MIL/uL Final  . Hemoglobin 12/20/2017 11.9* 12.0 - 15.0 g/dL Final  . HCT 12/20/2017 38.6  36.0 - 46.0 % Final  . MCV 12/20/2017 90.6  78.0 - 100.0 fL Final  . MCH 12/20/2017 27.9  26.0 - 34.0 pg Final  . MCHC 12/20/2017 30.8  30.0 - 36.0 g/dL Final  . RDW 12/20/2017 12.9  11.5 - 15.5 % Final  . Platelets 12/20/2017 273  150 - 400 K/uL Final   Performed at [facility], [address], [place], [place] [address]  . Troponin i, poc 12/20/2017 0.00  0.00 - 0.08 ng/mL Final  . Comment 3 12/20/2017          Final   Comment: Due to the  release kinetics of cTnI, a negative result within the first hours of the onset of symptoms does not rule out myocardial infarction with certainty. If myocardial infarction is still suspected, repeat the test at appropriate intervals.   . Troponin i, poc 12/20/2017 0.01  0.00 - 0.08 ng/mL Final  . Comment 3 12/20/2017          Final   Comment: Due to the release kinetics of cTnI, a negative result within the first hours of the onset of symptoms does not rule out myocardial infarction with certainty. If myocardial infarction is still suspected, repeat the test at appropriate intervals.      Pathology Orders Placed This Encounter  Procedures  . CBC with Differential/Platelet    Standing Status:   Future    Standing Expiration Date:   01/04/2019  . Comprehensive metabolic panel    Standing Status:   Future    Standing Expiration Date:   01/04/2019  . Lactate dehydrogenase    Standing Status:   Future    Standing Expiration Date:   01/04/2019       Zoila Shutter MD

## 2018-01-04 ENCOUNTER — Encounter (HOSPITAL_COMMUNITY): Payer: Self-pay | Admitting: Lab

## 2018-01-04 NOTE — Progress Notes (Unsigned)
Referral sent to Dr Luan Pulling.  Records faxed on 5/7

## 2018-01-17 DIAGNOSIS — J61 Pneumoconiosis due to asbestos and other mineral fibers: Secondary | ICD-10-CM | POA: Diagnosis not present

## 2018-01-17 DIAGNOSIS — I10 Essential (primary) hypertension: Secondary | ICD-10-CM | POA: Diagnosis not present

## 2018-01-18 ENCOUNTER — Other Ambulatory Visit (HOSPITAL_COMMUNITY): Payer: Self-pay | Admitting: Respiratory Therapy

## 2018-01-18 DIAGNOSIS — J61 Pneumoconiosis due to asbestos and other mineral fibers: Secondary | ICD-10-CM

## 2018-01-25 ENCOUNTER — Inpatient Hospital Stay (HOSPITAL_COMMUNITY): Admission: RE | Admit: 2018-01-25 | Payer: Medicare HMO | Source: Ambulatory Visit

## 2018-01-25 NOTE — Progress Notes (Signed)
Cardiology Office Note  Date: 01/26/2018   ID: [name], DOB Dec 30, 1940, MRN [id number]  PCP: Redmond School, MD  Primary Cardiologist: Rozann Lesches, MD   Chief Complaint  Patient presents with  . History of chest pain    History of Present Illness: [name] is a 77 y.o. female last seen in January 2018.  She is referred back to the office by Dr. Gerarda Fraction for evaluation of chest pain.  Records indicate ER visit in late April with chest pain. ECG showed no acute ST segment changes and troponin I levels were negative for ACS.  She presents today for evaluation, states that she has not had any chest or upper back discomfort since evaluation in the ER in April.  She does not recall any specific precipitant for the symptoms, has had no palpitations, dizziness, or syncope.  She does report generally mild dyspnea on exertion, worse if she walks up steps.  She does not report any recent medication changes.  I reviewed her recent ECG.  Past Medical History:  Diagnosis Date  . Breast cancer ([place])   . CKD (chronic kidney disease) stage 2, GFR 60-89 ml/min   . Essential hypertension   . Hyperlipidemia   . Type 2 diabetes mellitus (HCC)    diet controlled    Past Surgical History:  Procedure Laterality Date  . ABDOMINAL HYSTERECTOMY    . BREAST BIOPSY Left    benign  . EYE SURGERY Left    cataract removal  . PARTIAL MASTECTOMY WITH NEEDLE LOCALIZATION AND AXILLARY SENTINEL LYMPH NODE BX Right 10/28/2016   Procedure: PARTIAL MASTECTOMY WITH NEEDLE LOCALIZATION AND AXILLARY SENTINEL LYMPH NODE BX;  Surgeon: Aviva Signs, MD;  Location: [facility];  Service: General;  Laterality: Right;    Current Outpatient Medications  Medication Sig Dispense Refill  . anastrozole (ARIMIDEX) 1 MG tablet Take 1 tablet (1 mg total) by mouth daily. 90 tablet 3  . aspirin EC 81 MG tablet Take 1 tablet (81 mg total) by mouth daily.    Floria Raveling, Vaccinium myrtillus, (BILBERRY PO) Take  1,000 mg by mouth daily as needed (for eye health.).    Marland Kitchen calcium-vitamin D (OSCAL WITH D) 500-200 MG-UNIT tablet Take 2 tablets by mouth daily with breakfast. 60 tablet 6  . diltiazem (TIAZAC) 120 MG 24 hr capsule Take 120 mg by mouth daily.  1  . Menthol-Camphor (TIGER BALM ARTHRITIS RUB) 11-11 % CREA Apply 1 application topically 3 (three) times daily as needed (for pain (back)).      No current facility-administered medications for this visit.    Allergies:  Patient has no known allergies.   Social History: The patient  reports that she has never smoked. She has never used smokeless tobacco. She reports that she does not drink alcohol or use drugs.   Family History: The patient's family history includes Diabetes Mellitus II in her brother, brother, and sister; Hypertension in her father and mother.   ROS:  Please see the history of present illness. Otherwise, complete review of systems is positive for none.  All other systems are reviewed and negative.   Physical Exam: VS:  BP (!) 164/84 (BP Location: Left Arm)   Pulse 71   Ht 5\' 3"  (1.6 m)   Wt 184 lb (83.5 kg)   SpO2 99%   BMI 32.59 kg/m , BMI Body mass index is 32.59 kg/m.  Wt Readings from Last 3 Encounters:  01/26/18 184 lb (83.5 kg)  01/03/18 187 lb  6.4 oz (85 kg)  12/20/17 189 lb (85.7 kg)    General: Patient appears comfortable at rest. HEENT: Conjunctiva and lids normal, oropharynx clear. Neck: Supple, no elevated JVP or carotid bruits, no thyromegaly. Lungs: Clear to auscultation, nonlabored breathing at rest. Cardiac: Regular rate and rhythm, no S3, 2/6 systolic murmur, no pericardial rub. Abdomen: Soft, nontender, bowel sounds present. Extremities: No pitting edema, distal pulses 2+. Skin: Warm and dry. Musculoskeletal: No kyphosis. Neuropsychiatric: Alert and oriented x3, affect grossly appropriate.  ECG: I personally reviewed the tracing from 12/20/2017 which showed sinus rhythm with left anterior fascicular  block.  Recent Labwork: 12/20/2017: BUN 17; Creatinine, Ser 1.21; Hemoglobin 11.9; Platelets 273; Potassium 4.2; Sodium 138  August 2018: Cholesterol 238, HDL 55, LDL 154, triglycerides 144  Other Studies Reviewed Today:  Echocardiogram 10/06/2016: Study Conclusions  - Left ventricle: The cavity size was normal. Wall thickness was   increased in a pattern of moderate LVH. Systolic function was   normal. The estimated ejection fraction was in the range of 60%   to 65%. Doppler parameters are consistent with abnormal left   ventricular relaxation (grade 1 diastolic dysfunction). - Aortic valve: Calcified non coronary cusp. - Mitral valve: Calcified annulus. - Atrial septum: No defect or patent foramen ovale was identified.  Chest CTA 12/20/2017: IMPRESSION: 1. No evidence of pulmonary embolism or other acute findings in the chest. 2. Multiple bilateral partially calcified pleural plaques are consistent with prior asbestos exposure. No evidence of associated asbestosis, pleural tumor or other pulmonary abnormalities.  Assessment and Plan:  1.  History of chest discomfort, reported back in late April without definitive recurrence.  She did not have evidence of ACS at that time.  ECG shows left anterior fascicular block.  Chest CTA reported no pulmonary embolus, she did have calcified pleural plaques noted, and states that she had follow-up with Dr. Luan Pulling for pulmonary assessment.  It does not sound like anything acute is suspected at this point from a pulmonary perspective.  She does have cardiac risk factors including hypertension, hyperlipidemia, and type 2 diabetes mellitus.  We will plan to arrange a Lexiscan Myoview for objective assessment of ischemia.  2.  Aortic valve sclerosis without stenosis by echocardiogram in 2018.  Cardiac murmur consistent with this.  3.  Essential hypertension, on diltiazem CD with follow-up per PCP.  Current medicines were reviewed with the patient  today.   Orders Placed This Encounter  Procedures  . NM Myocar Multi W/Spect W/Wall Motion / EF    Disposition: Call with test results.  Signed, Satira Sark, MD, [facility] 01/26/2018 3:13 PM    [place] [facility] [address] [place],  [address] Phone: [phone]; Fax: [phone]

## 2018-01-26 ENCOUNTER — Ambulatory Visit: Payer: Medicare HMO | Admitting: Cardiology

## 2018-01-26 ENCOUNTER — Encounter: Payer: Self-pay | Admitting: Cardiology

## 2018-01-26 VITALS — BP 164/84 | HR 71 | Ht 63.0 in | Wt 184.0 lb

## 2018-01-26 DIAGNOSIS — R9431 Abnormal electrocardiogram [ECG] [EKG]: Secondary | ICD-10-CM | POA: Diagnosis not present

## 2018-01-26 DIAGNOSIS — R0609 Other forms of dyspnea: Secondary | ICD-10-CM | POA: Diagnosis not present

## 2018-01-26 DIAGNOSIS — R0789 Other chest pain: Secondary | ICD-10-CM | POA: Diagnosis not present

## 2018-01-26 DIAGNOSIS — I10 Essential (primary) hypertension: Secondary | ICD-10-CM

## 2018-01-26 NOTE — Patient Instructions (Signed)
Medication Instructions:  Your physician recommends that you continue on your current medications as directed. Please refer to the Current Medication list given to you today.   Labwork: NONE  Testing/Procedures: Your physician has requested that you have a lexiscan myoview. For further information please visit [email or web address]. Please follow instruction sheet, as given.    Follow-Up: Your physician recommends that you schedule a follow-up appointment in: TO BE DETERMINED - WE WILL CALL WITH TEST RESULTS    Any Other Special Instructions Will Be Listed Below (If Applicable).     If you need a refill on your cardiac medications before your next appointment, please call your pharmacy.

## 2018-02-03 ENCOUNTER — Encounter (HOSPITAL_COMMUNITY): Payer: Self-pay

## 2018-02-03 ENCOUNTER — Encounter (HOSPITAL_COMMUNITY)
Admission: RE | Admit: 2018-02-03 | Discharge: 2018-02-03 | Disposition: A | Discharge status: Home / Self Care | Payer: Medicare HMO | Source: Ambulatory Visit | Attending: Cardiology | Admitting: Cardiology

## 2018-02-03 ENCOUNTER — Encounter (HOSPITAL_BASED_OUTPATIENT_CLINIC_OR_DEPARTMENT_OTHER)
Admission: RE | Admit: 2018-02-03 | Discharge: 2018-02-03 | Disposition: A | Discharge status: Home / Self Care | Payer: Medicare HMO | Source: Ambulatory Visit | Attending: Cardiology | Admitting: Cardiology

## 2018-02-03 DIAGNOSIS — R0789 Other chest pain: Secondary | ICD-10-CM

## 2018-02-03 DIAGNOSIS — R9431 Abnormal electrocardiogram [ECG] [EKG]: Secondary | ICD-10-CM | POA: Insufficient documentation

## 2018-02-03 LAB — NM MYOCAR MULTI W/SPECT W/WALL MOTION / EF
LV dias vol: 61 mL (ref 46–106)
LV sys vol: 19 mL
Peak HR: 89 {beats}/min
RATE: 0.29
Rest HR: 66 {beats}/min
SDS: 4
SRS: 6
SSS: 10
TID: 1.21

## 2018-02-03 MED ORDER — SODIUM CHLORIDE 0.9% FLUSH
INTRAVENOUS | Status: AC
  Administered 2018-02-03: 10 mL via INTRAVENOUS
  Filled 2018-02-03: qty 10

## 2018-02-03 MED ORDER — TECHNETIUM TC 99M TETROFOSMIN IV KIT
10.0000 | PACK | Freq: Once | INTRAVENOUS | Status: AC | PRN
  Administered 2018-02-03: 11 via INTRAVENOUS

## 2018-02-03 MED ORDER — TECHNETIUM TC 99M TETROFOSMIN IV KIT
30.0000 | PACK | Freq: Once | INTRAVENOUS | Status: AC | PRN
  Administered 2018-02-03: 33 via INTRAVENOUS

## 2018-02-03 MED ORDER — REGADENOSON 0.4 MG/5ML IV SOLN
INTRAVENOUS | Status: AC
  Administered 2018-02-03: 0.4 mg via INTRAVENOUS
  Filled 2018-02-03: qty 5

## 2018-03-28 DIAGNOSIS — E119 Type 2 diabetes mellitus without complications: Secondary | ICD-10-CM | POA: Diagnosis not present

## 2018-04-04 DIAGNOSIS — N189 Chronic kidney disease, unspecified: Secondary | ICD-10-CM | POA: Diagnosis not present

## 2018-04-04 DIAGNOSIS — Z6833 Body mass index (BMI) 33.0-33.9, adult: Secondary | ICD-10-CM | POA: Diagnosis not present

## 2018-04-04 DIAGNOSIS — I10 Essential (primary) hypertension: Secondary | ICD-10-CM | POA: Diagnosis not present

## 2018-04-04 DIAGNOSIS — E1129 Type 2 diabetes mellitus with other diabetic kidney complication: Secondary | ICD-10-CM | POA: Diagnosis not present

## 2018-04-04 DIAGNOSIS — E6609 Other obesity due to excess calories: Secondary | ICD-10-CM | POA: Diagnosis not present

## 2018-04-11 IMAGING — MG 2D DIGITAL DIAGNOSTIC BILATERAL MAMMOGRAM WITH CAD AND ADJUNCT T
8 series · 8 of 16 positions shown · non-contrast
Comparison: 08/10/2016 and earlier priors

CLINICAL DATA: Patient recalled from recent screening mammogram for
calcifications in the left breast and 2 possible masses in the right
breast.

EXAM:
2D DIGITAL DIAGNOSTIC BILATERAL MAMMOGRAM WITH CAD AND ADJUNCT TOMO
ULTRASOUND RIGHT BREAST

[L CC (1 of 2)]
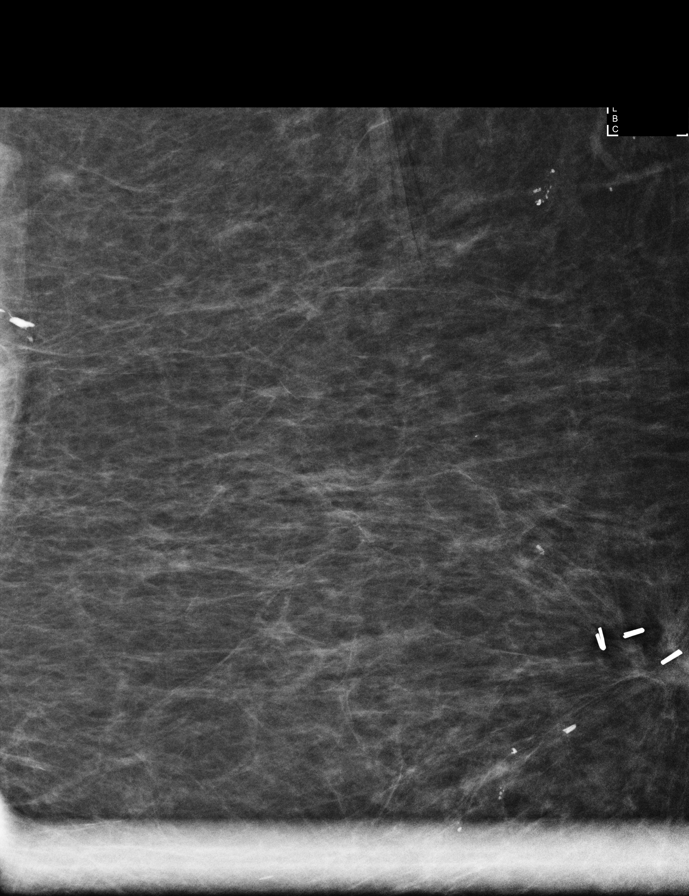

[L ML (1 of 2)]
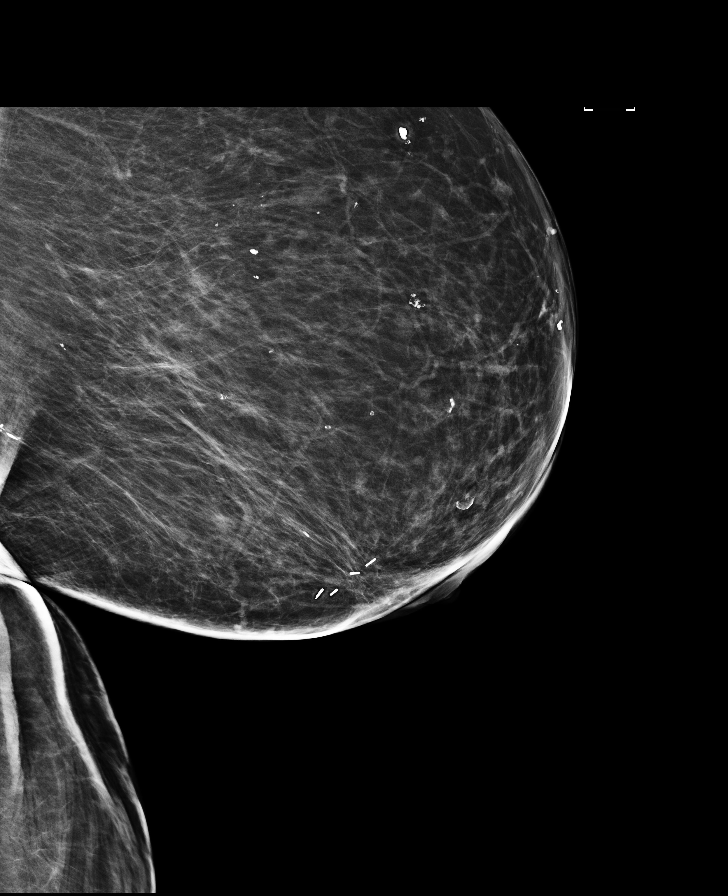

[L ML (2 of 2)]
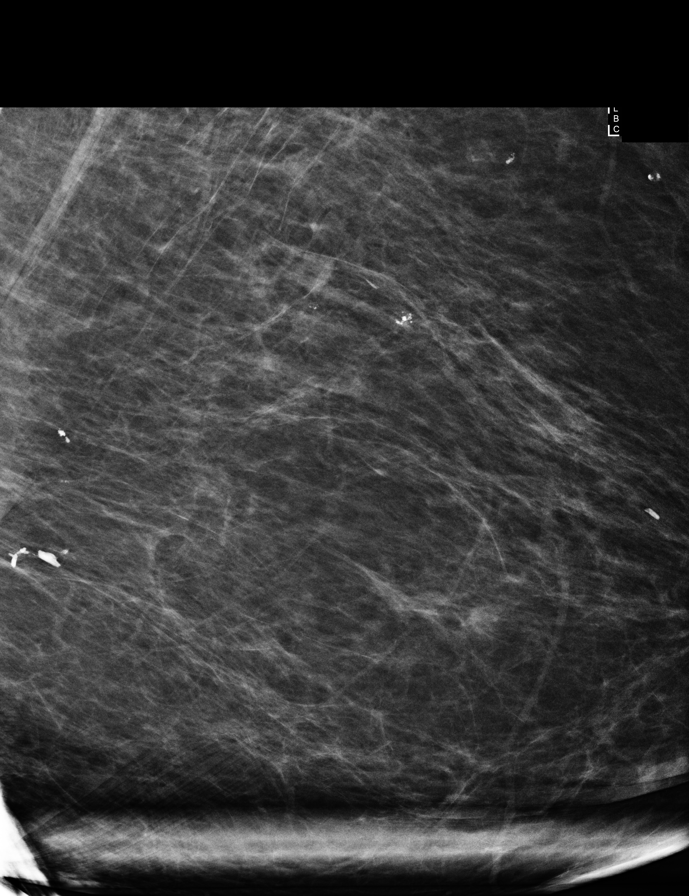

[L CC (2 of 2)]
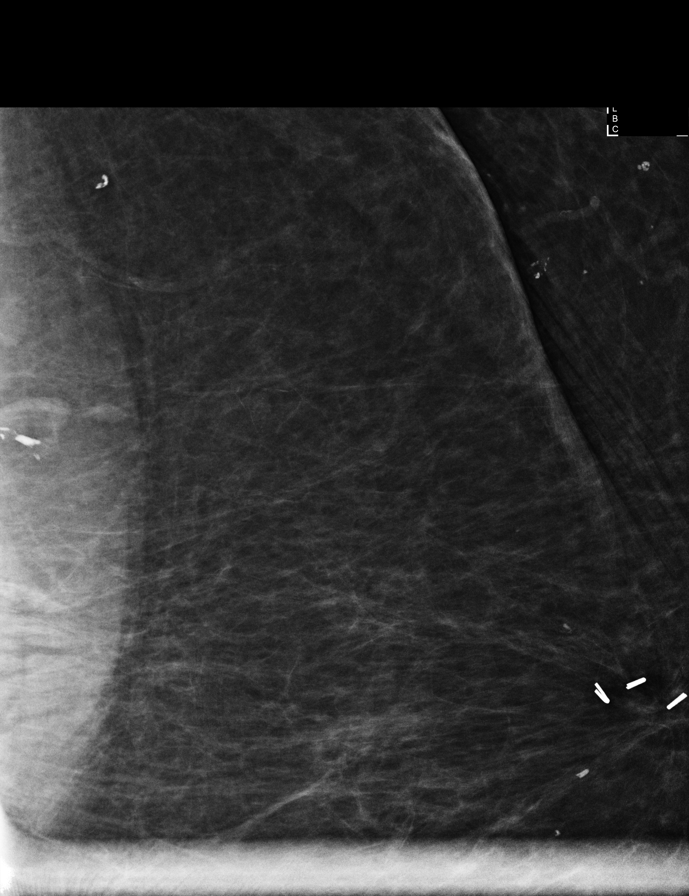

[R MLO]
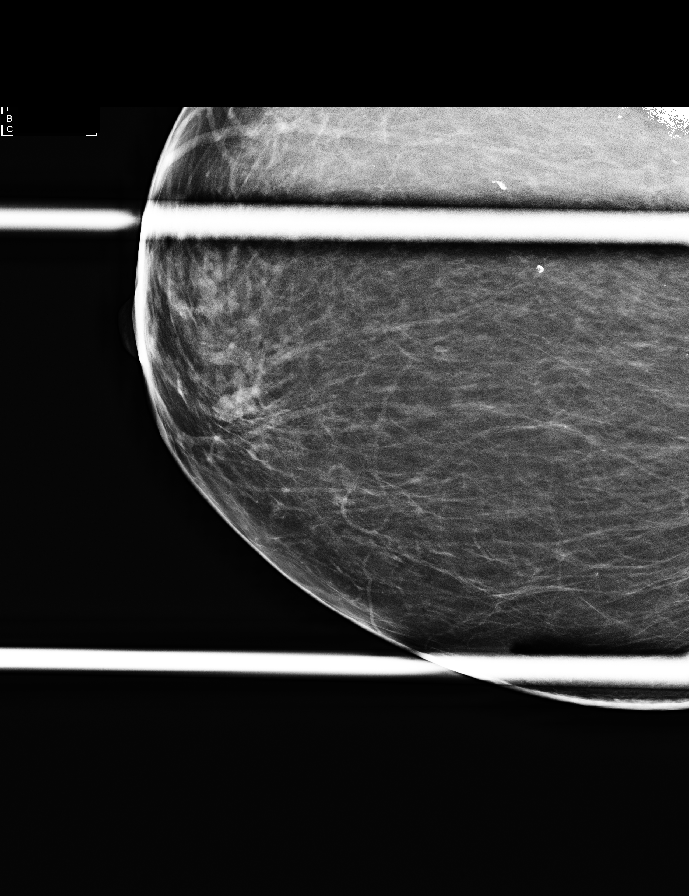

[R CC]
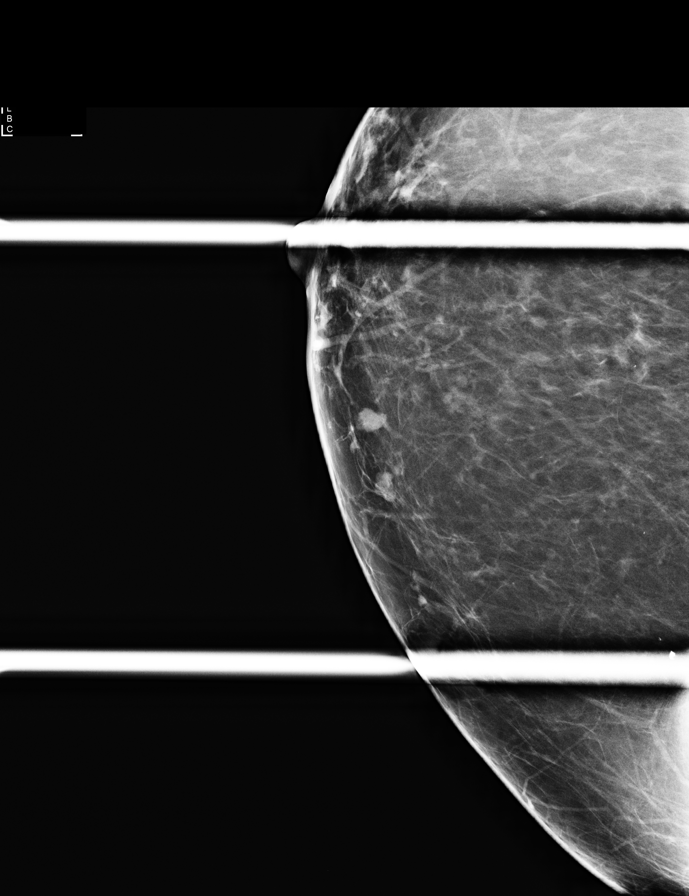

[R MLO tomo · tomo slice 33/66.0]
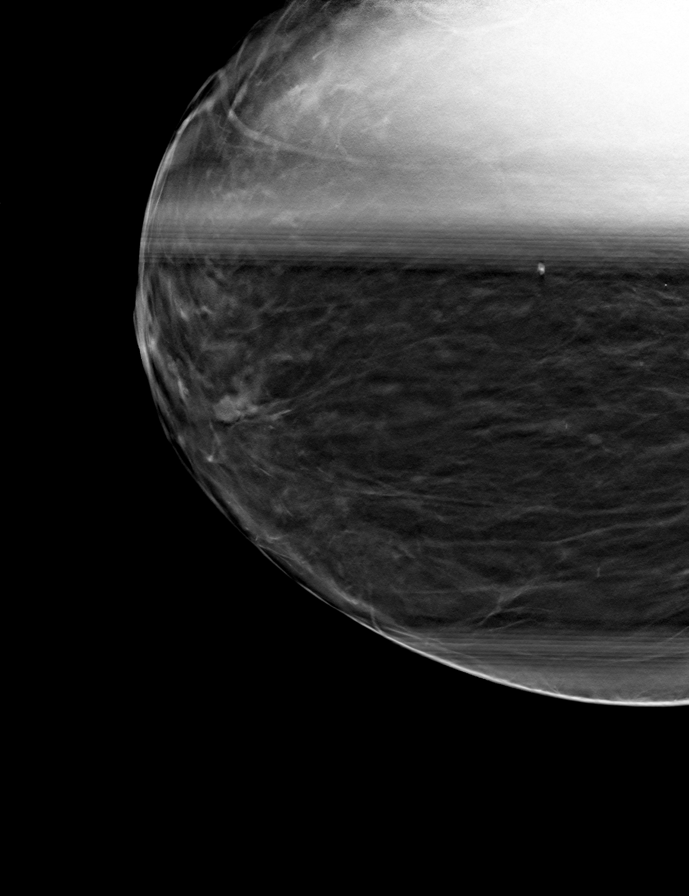

[R CC tomo · tomo slice 27/53.0]
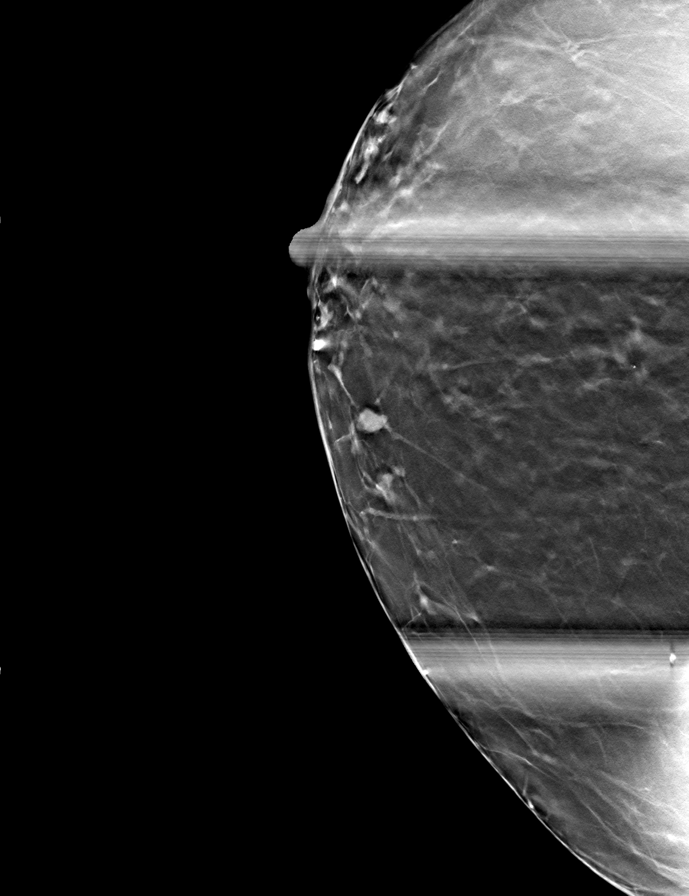

[8 of 16 positions shown; findings below may reference images not displayed]

ACR Breast Density Category b: There are scattered areas of
fibroglandular density.
FINDINGS: Magnification views of the deep outer right breast show very coarse
rod-like calcifications consistent with benign dystrophic
calcifications. There are no suspicious microcalcifications.

Spot compression views of the upper inner periareolar right breast
confirm the presence of two small irregular masses. These masses
measure 5-6 mm in maximum diameter.

Mammographic images were processed with CAD.

On physical exam, no mass is palpated in the periareolar right
breast.

Targeted ultrasound is performed, showing two irregular hypoechoic
masses in the 2 o'clock position of the right breast approximately
1-3 cm from the nipple. The larger mass, centered at 1-2 cm from the
nipple, measures 6 x 4 x 5 mm. The smaller mass centered at 3 cm
from the nipple measures 6 x 3 x 4 mm. A mildly dilated duct is seen
between the two masses. The distance from outer margin of the mass
closer to the nipple to the outer margin of the mass furthest from
the nipple is 1.9 cm.

Ultrasound of the right axilla is negative for lymphadenopathy.
IMPRESSION: Two small irregular hypoechoic masses in the 2 o'clock position of
the right breast periareolar is described above. Malignancy cannot
be excluded.

Benign dystrophic calcifications in the deep outer left breast. No
evidence of malignancy on the left.

RECOMMENDATION:
Ultrasound-guided biopsies of both of the irregular masses in the 2
o'clock position of the right breast periareolar is recommended and
is being scheduled for the patient.

I have discussed the findings and recommendations with the patient.
Results were also provided in writing at the conclusion of the
visit. If applicable, a reminder letter will be sent to the patient
regarding the next appointment.

BI-RADS CATEGORY  4: Suspicious.

## 2018-04-14 DIAGNOSIS — E1140 Type 2 diabetes mellitus with diabetic neuropathy, unspecified: Secondary | ICD-10-CM | POA: Diagnosis not present

## 2018-04-20 DIAGNOSIS — J329 Chronic sinusitis, unspecified: Secondary | ICD-10-CM | POA: Diagnosis not present

## 2018-04-20 DIAGNOSIS — Z6833 Body mass index (BMI) 33.0-33.9, adult: Secondary | ICD-10-CM | POA: Diagnosis not present

## 2018-04-20 DIAGNOSIS — E6609 Other obesity due to excess calories: Secondary | ICD-10-CM | POA: Diagnosis not present

## 2018-04-20 DIAGNOSIS — M47816 Spondylosis without myelopathy or radiculopathy, lumbar region: Secondary | ICD-10-CM | POA: Diagnosis not present

## 2018-04-20 DIAGNOSIS — E119 Type 2 diabetes mellitus without complications: Secondary | ICD-10-CM | POA: Diagnosis not present

## 2018-04-20 DIAGNOSIS — N189 Chronic kidney disease, unspecified: Secondary | ICD-10-CM | POA: Diagnosis not present

## 2018-04-20 DIAGNOSIS — E782 Mixed hyperlipidemia: Secondary | ICD-10-CM | POA: Diagnosis not present

## 2018-04-20 DIAGNOSIS — M5417 Radiculopathy, lumbosacral region: Secondary | ICD-10-CM | POA: Diagnosis not present

## 2018-04-20 DIAGNOSIS — I10 Essential (primary) hypertension: Secondary | ICD-10-CM | POA: Diagnosis not present

## 2018-04-20 DIAGNOSIS — N182 Chronic kidney disease, stage 2 (mild): Secondary | ICD-10-CM | POA: Diagnosis not present

## 2018-04-20 DIAGNOSIS — E1129 Type 2 diabetes mellitus with other diabetic kidney complication: Secondary | ICD-10-CM | POA: Diagnosis not present

## 2018-04-20 IMAGING — US ULTRASOUND RIGHT BREAST LIMITED
1 series · 13 of 14 positions shown · non-contrast
Comparison: 08/10/2016 and earlier priors

CLINICAL DATA: Patient recalled from recent screening mammogram for
calcifications in the left breast and 2 possible masses in the right
breast.

EXAM:
2D DIGITAL DIAGNOSTIC BILATERAL MAMMOGRAM WITH CAD AND ADJUNCT TOMO
ULTRASOUND RIGHT BREAST

[Series 1: ultrasound right breast limited · 0.07mm/px · 13 of 14 slices shown]
[im 1/14]
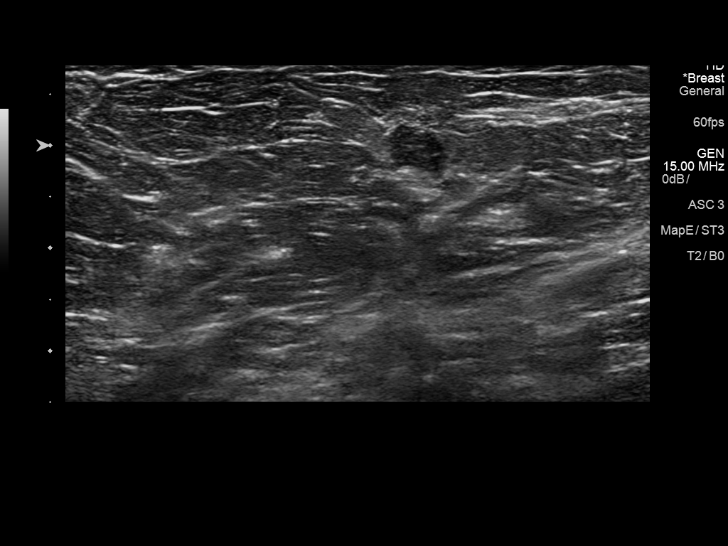
[im 2/14]
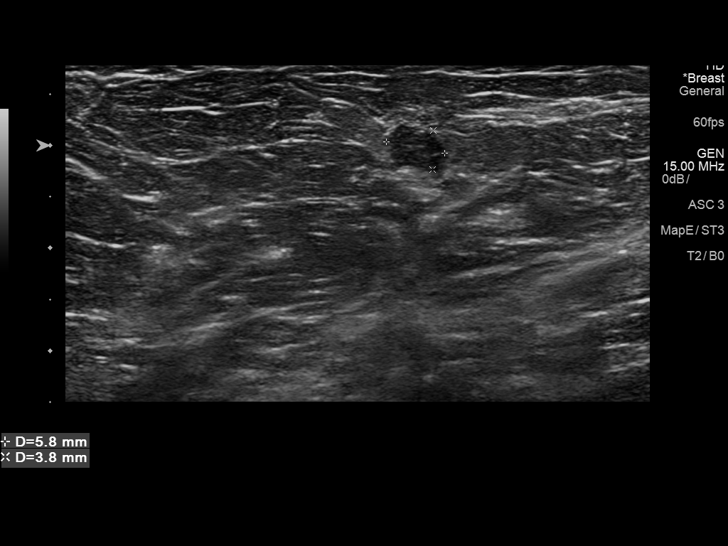
[im 3/14]
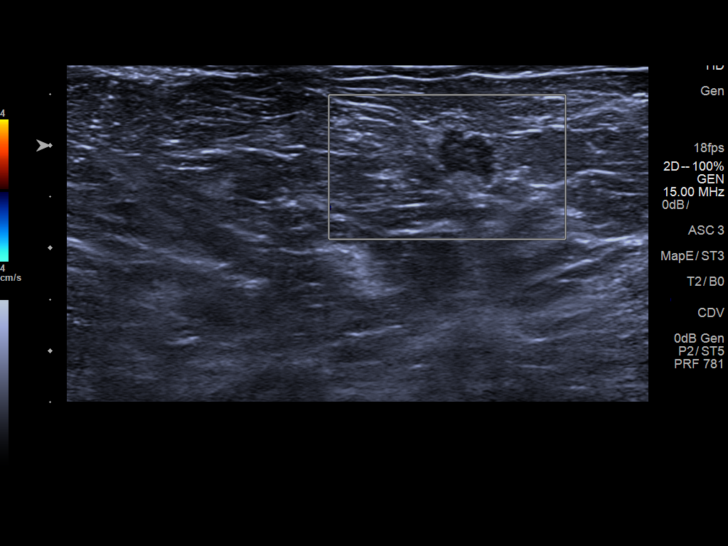
[im 4/14]
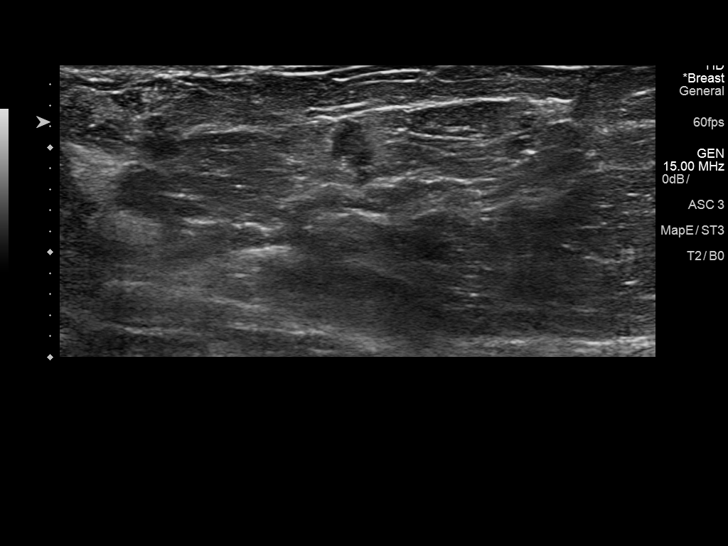
[im 5/14]
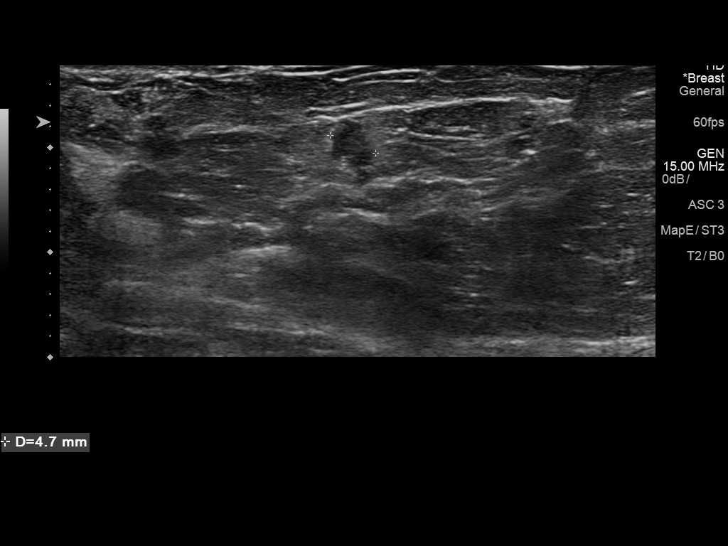
[im 6/14]
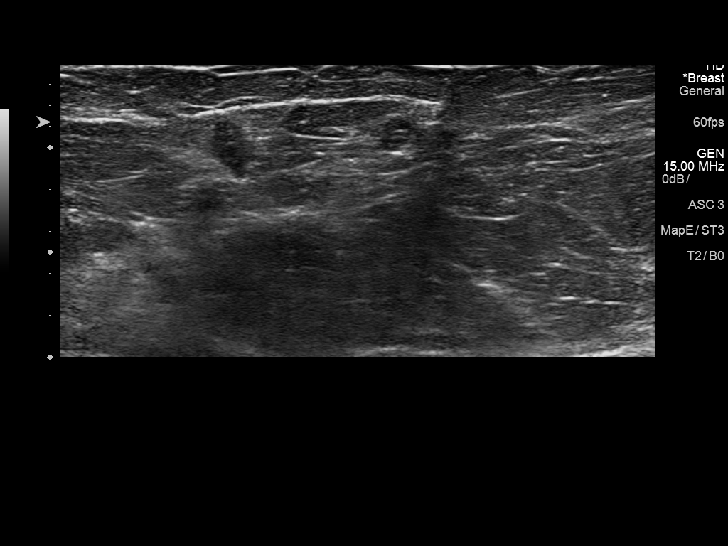
[im 8/14]
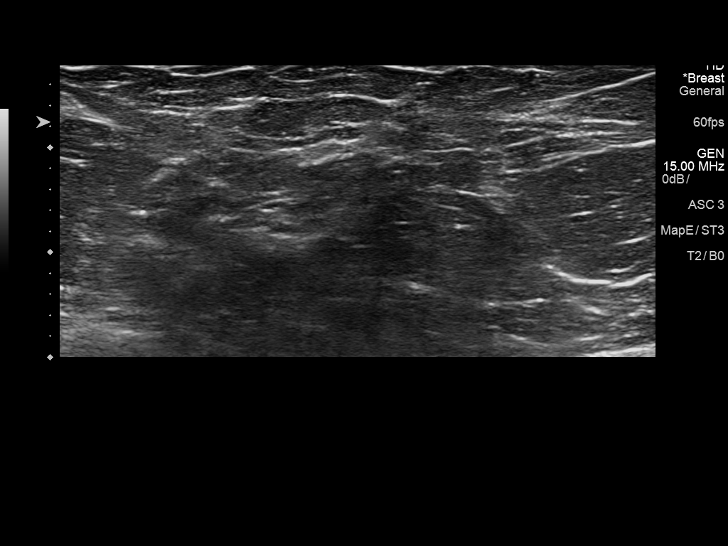
[im 9/14]
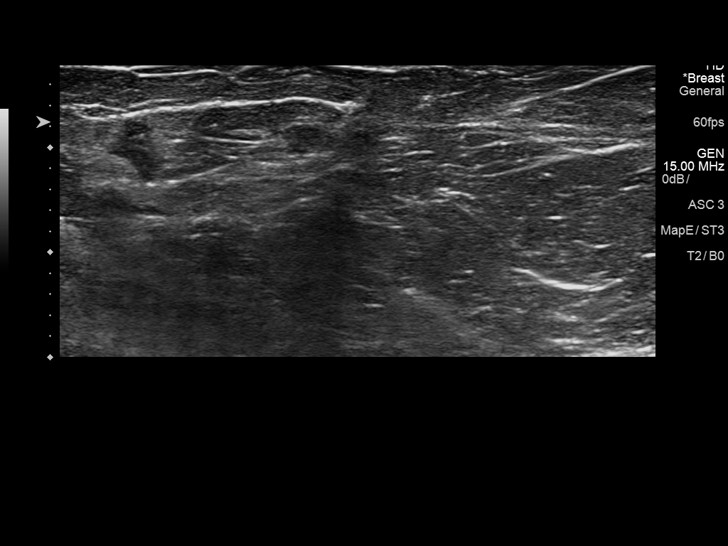
[im 10/14]
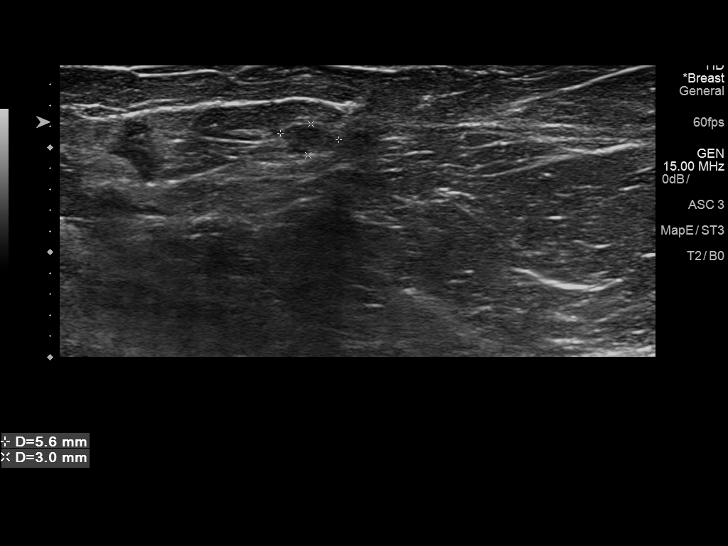
[im 11/14]
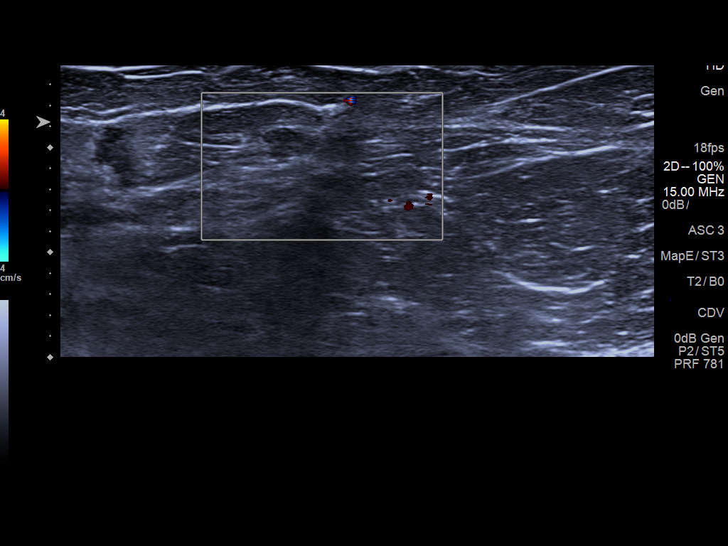
[im 12/14]
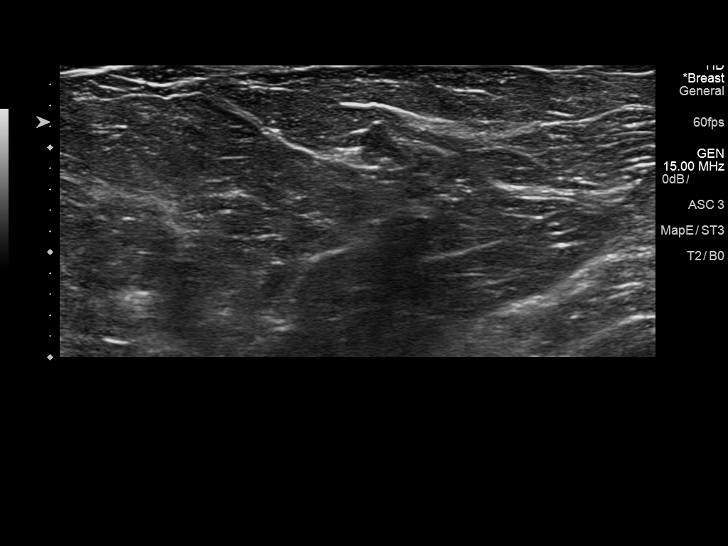
[im 13/14]
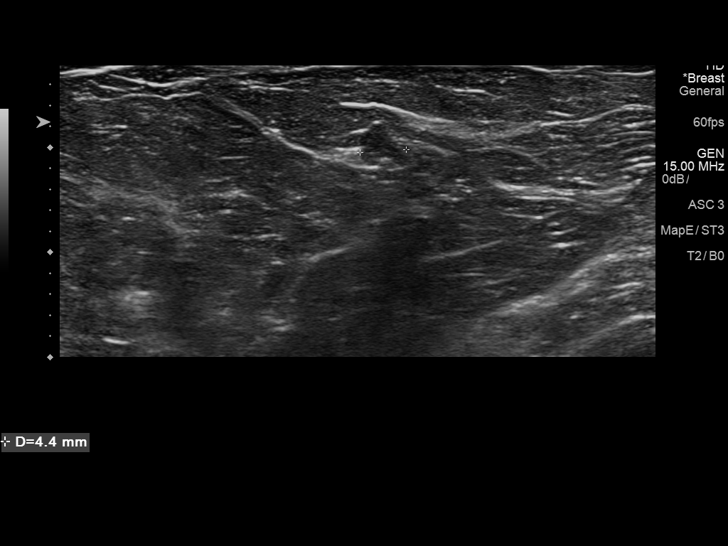
[im 14/14]
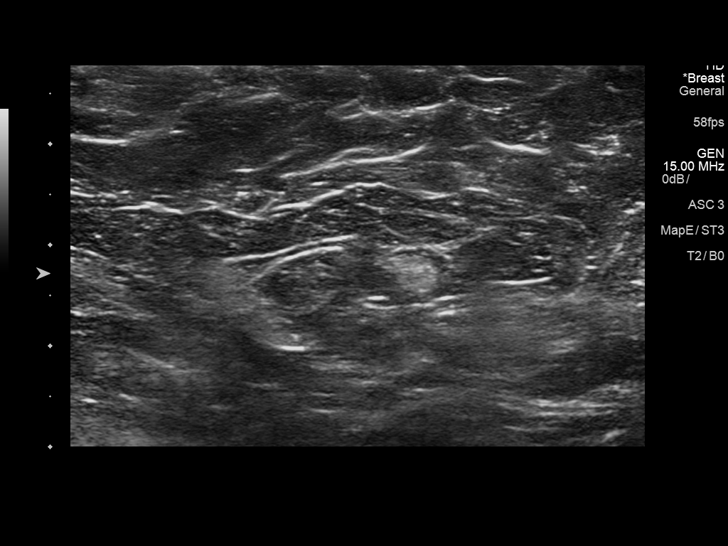

[13 of 14 positions shown; findings below may reference images not displayed]

ACR Breast Density Category b: There are scattered areas of
fibroglandular density.
FINDINGS: Magnification views of the deep outer right breast show very coarse
rod-like calcifications consistent with benign dystrophic
calcifications. There are no suspicious microcalcifications.

Spot compression views of the upper inner periareolar right breast
confirm the presence of two small irregular masses. These masses
measure 5-6 mm in maximum diameter.

Mammographic images were processed with CAD.

On physical exam, no mass is palpated in the periareolar right
breast.

Targeted ultrasound is performed, showing two irregular hypoechoic
masses in the 2 o'clock position of the right breast approximately
1-3 cm from the nipple. The larger mass, centered at 1-2 cm from the
nipple, measures 6 x 4 x 5 mm. The smaller mass centered at 3 cm
from the nipple measures 6 x 3 x 4 mm. A mildly dilated duct is seen
between the two masses. The distance from outer margin of the mass
closer to the nipple to the outer margin of the mass furthest from
the nipple is 1.9 cm.

Ultrasound of the right axilla is negative for lymphadenopathy.
IMPRESSION: Two small irregular hypoechoic masses in the 2 o'clock position of
the right breast periareolar is described above. Malignancy cannot
be excluded.

Benign dystrophic calcifications in the deep outer left breast. No
evidence of malignancy on the left.

RECOMMENDATION:
Ultrasound-guided biopsies of both of the irregular masses in the 2
o'clock position of the right breast periareolar is recommended and
is being scheduled for the patient.

I have discussed the findings and recommendations with the patient.
Results were also provided in writing at the conclusion of the
visit. If applicable, a reminder letter will be sent to the patient
regarding the next appointment.

BI-RADS CATEGORY  4: Suspicious.

## 2018-05-04 DIAGNOSIS — Z0000 Encounter for general adult medical examination without abnormal findings: Secondary | ICD-10-CM | POA: Diagnosis not present

## 2018-05-04 DIAGNOSIS — I10 Essential (primary) hypertension: Secondary | ICD-10-CM | POA: Diagnosis not present

## 2018-05-04 DIAGNOSIS — Z6832 Body mass index (BMI) 32.0-32.9, adult: Secondary | ICD-10-CM | POA: Diagnosis not present

## 2018-05-04 DIAGNOSIS — Z1389 Encounter for screening for other disorder: Secondary | ICD-10-CM | POA: Diagnosis not present

## 2018-05-04 DIAGNOSIS — E6609 Other obesity due to excess calories: Secondary | ICD-10-CM | POA: Diagnosis not present

## 2018-05-09 DIAGNOSIS — Z0100 Encounter for examination of eyes and vision without abnormal findings: Secondary | ICD-10-CM | POA: Diagnosis not present

## 2018-05-13 ENCOUNTER — Other Ambulatory Visit: Payer: Self-pay

## 2018-05-13 ENCOUNTER — Emergency Department (HOSPITAL_COMMUNITY)
Admission: EM | Admit: 2018-05-13 | Discharge: 2018-05-13 | Disposition: A | Discharge status: Home / Self Care | Payer: Medicare HMO | Attending: Emergency Medicine | Admitting: Emergency Medicine

## 2018-05-13 ENCOUNTER — Encounter (HOSPITAL_COMMUNITY): Payer: Self-pay | Admitting: Emergency Medicine

## 2018-05-13 DIAGNOSIS — E1122 Type 2 diabetes mellitus with diabetic chronic kidney disease: Secondary | ICD-10-CM | POA: Insufficient documentation

## 2018-05-13 DIAGNOSIS — H66002 Acute suppurative otitis media without spontaneous rupture of ear drum, left ear: Secondary | ICD-10-CM | POA: Insufficient documentation

## 2018-05-13 DIAGNOSIS — Z79899 Other long term (current) drug therapy: Secondary | ICD-10-CM | POA: Insufficient documentation

## 2018-05-13 DIAGNOSIS — I129 Hypertensive chronic kidney disease with stage 1 through stage 4 chronic kidney disease, or unspecified chronic kidney disease: Secondary | ICD-10-CM | POA: Diagnosis not present

## 2018-05-13 DIAGNOSIS — N182 Chronic kidney disease, stage 2 (mild): Secondary | ICD-10-CM | POA: Insufficient documentation

## 2018-05-13 DIAGNOSIS — Z7982 Long term (current) use of aspirin: Secondary | ICD-10-CM | POA: Insufficient documentation

## 2018-05-13 DIAGNOSIS — Z853 Personal history of malignant neoplasm of breast: Secondary | ICD-10-CM | POA: Insufficient documentation

## 2018-05-13 DIAGNOSIS — R51 Headache: Secondary | ICD-10-CM | POA: Diagnosis present

## 2018-05-13 DIAGNOSIS — H6591 Unspecified nonsuppurative otitis media, right ear: Secondary | ICD-10-CM | POA: Diagnosis not present

## 2018-05-13 DIAGNOSIS — H73891 Other specified disorders of tympanic membrane, right ear: Secondary | ICD-10-CM | POA: Diagnosis not present

## 2018-05-13 MED ORDER — LORATADINE 10 MG PO TABS: 10.0000 mg | ORAL_TABLET | Freq: Every day | ORAL | 0 refills | Status: DC | PRN

## 2018-05-13 MED ORDER — AMOXICILLIN 500 MG PO CAPS: 1000.0000 mg | ORAL_CAPSULE | Freq: Two times a day (BID) | ORAL | 0 refills | Status: DC

## 2018-05-13 NOTE — ED Triage Notes (Signed)
Patient complaining of constant headache x 1 week.

## 2018-05-18 IMAGING — DX DG CHEST 2V
2 series · 2 of 2 positions shown · non-contrast
Comparison: August 10, 2016

CLINICAL DATA: Right breast carcinoma. Preoperative evaluation.
Hypertension.

EXAM:
CHEST  2 VIEW

[chest pa]
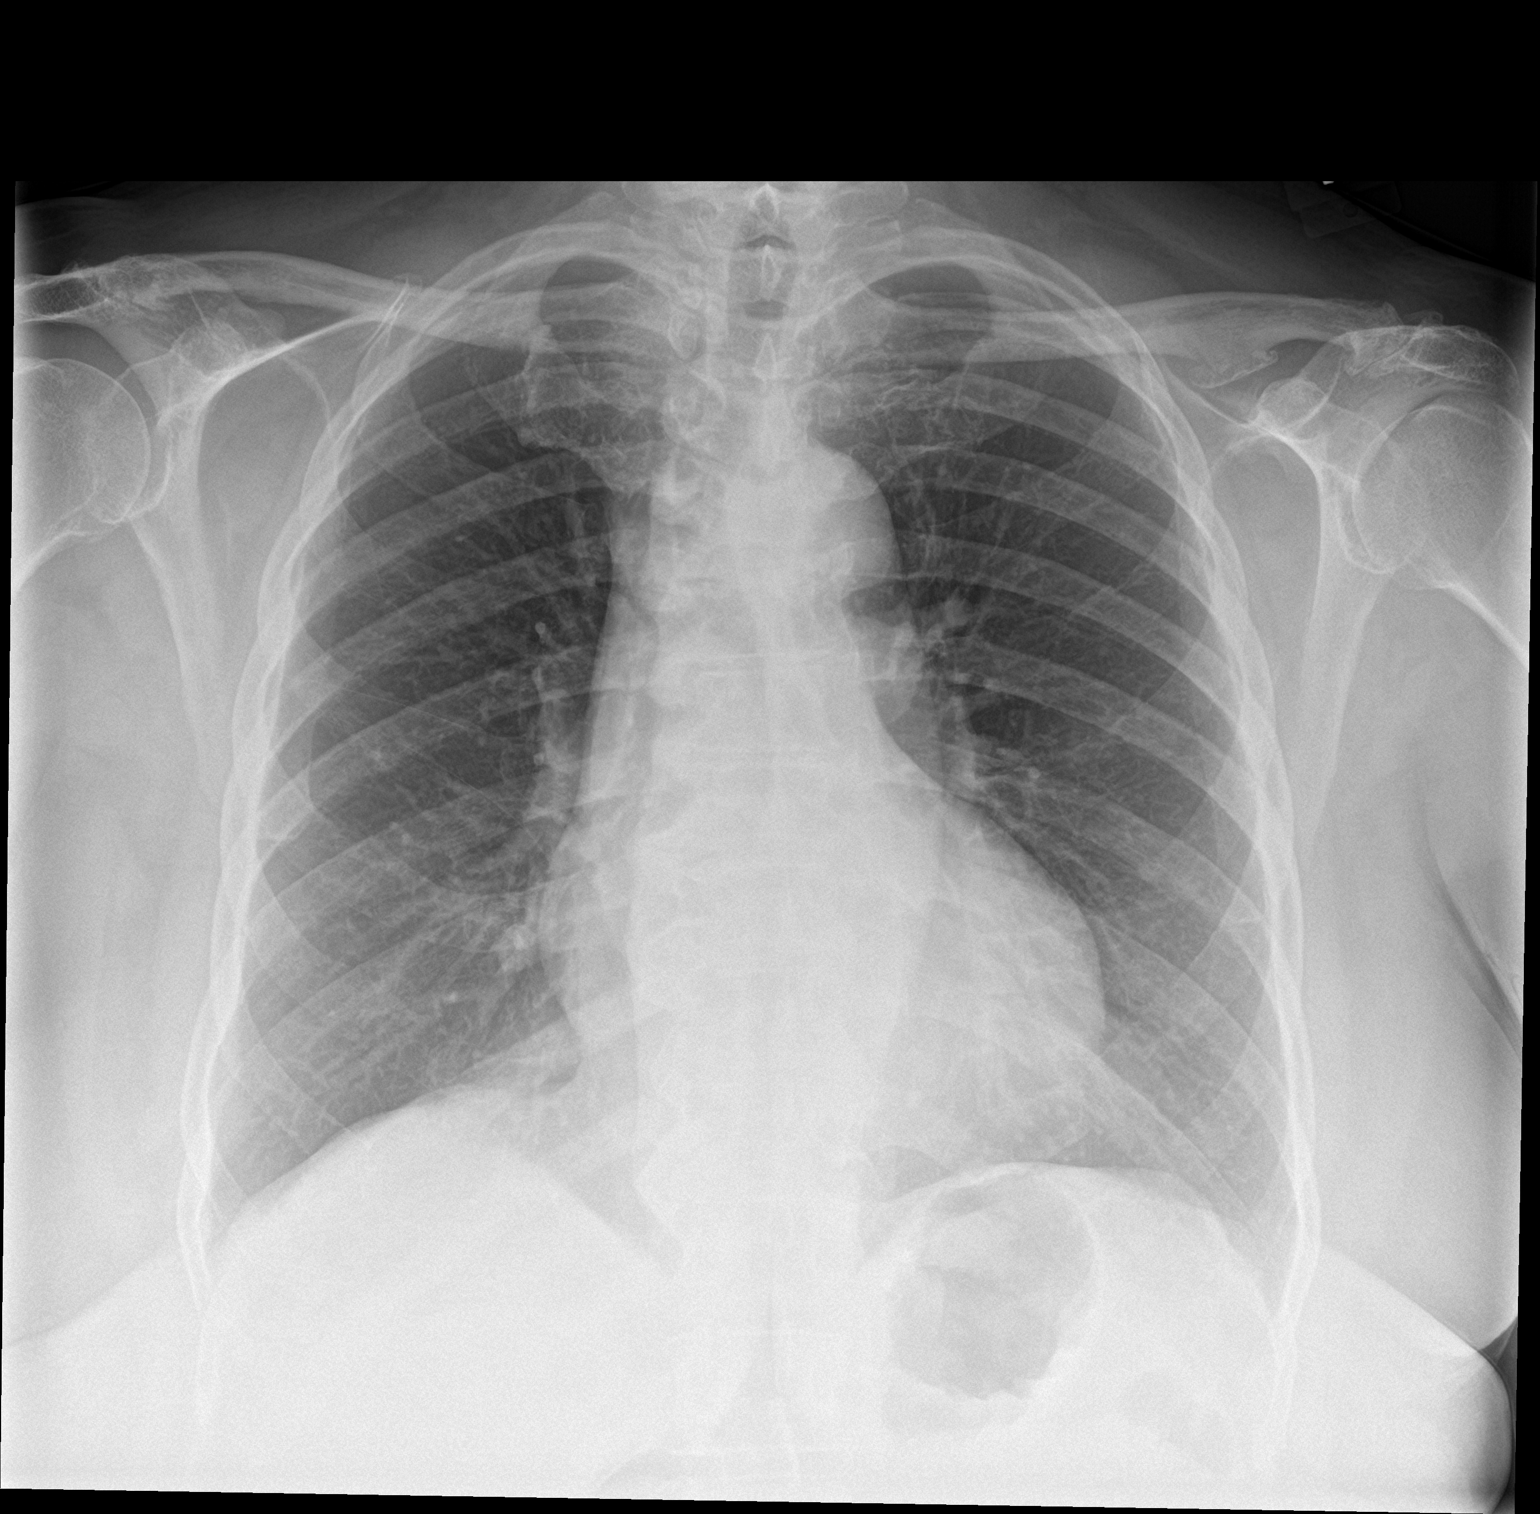

[chest lat]
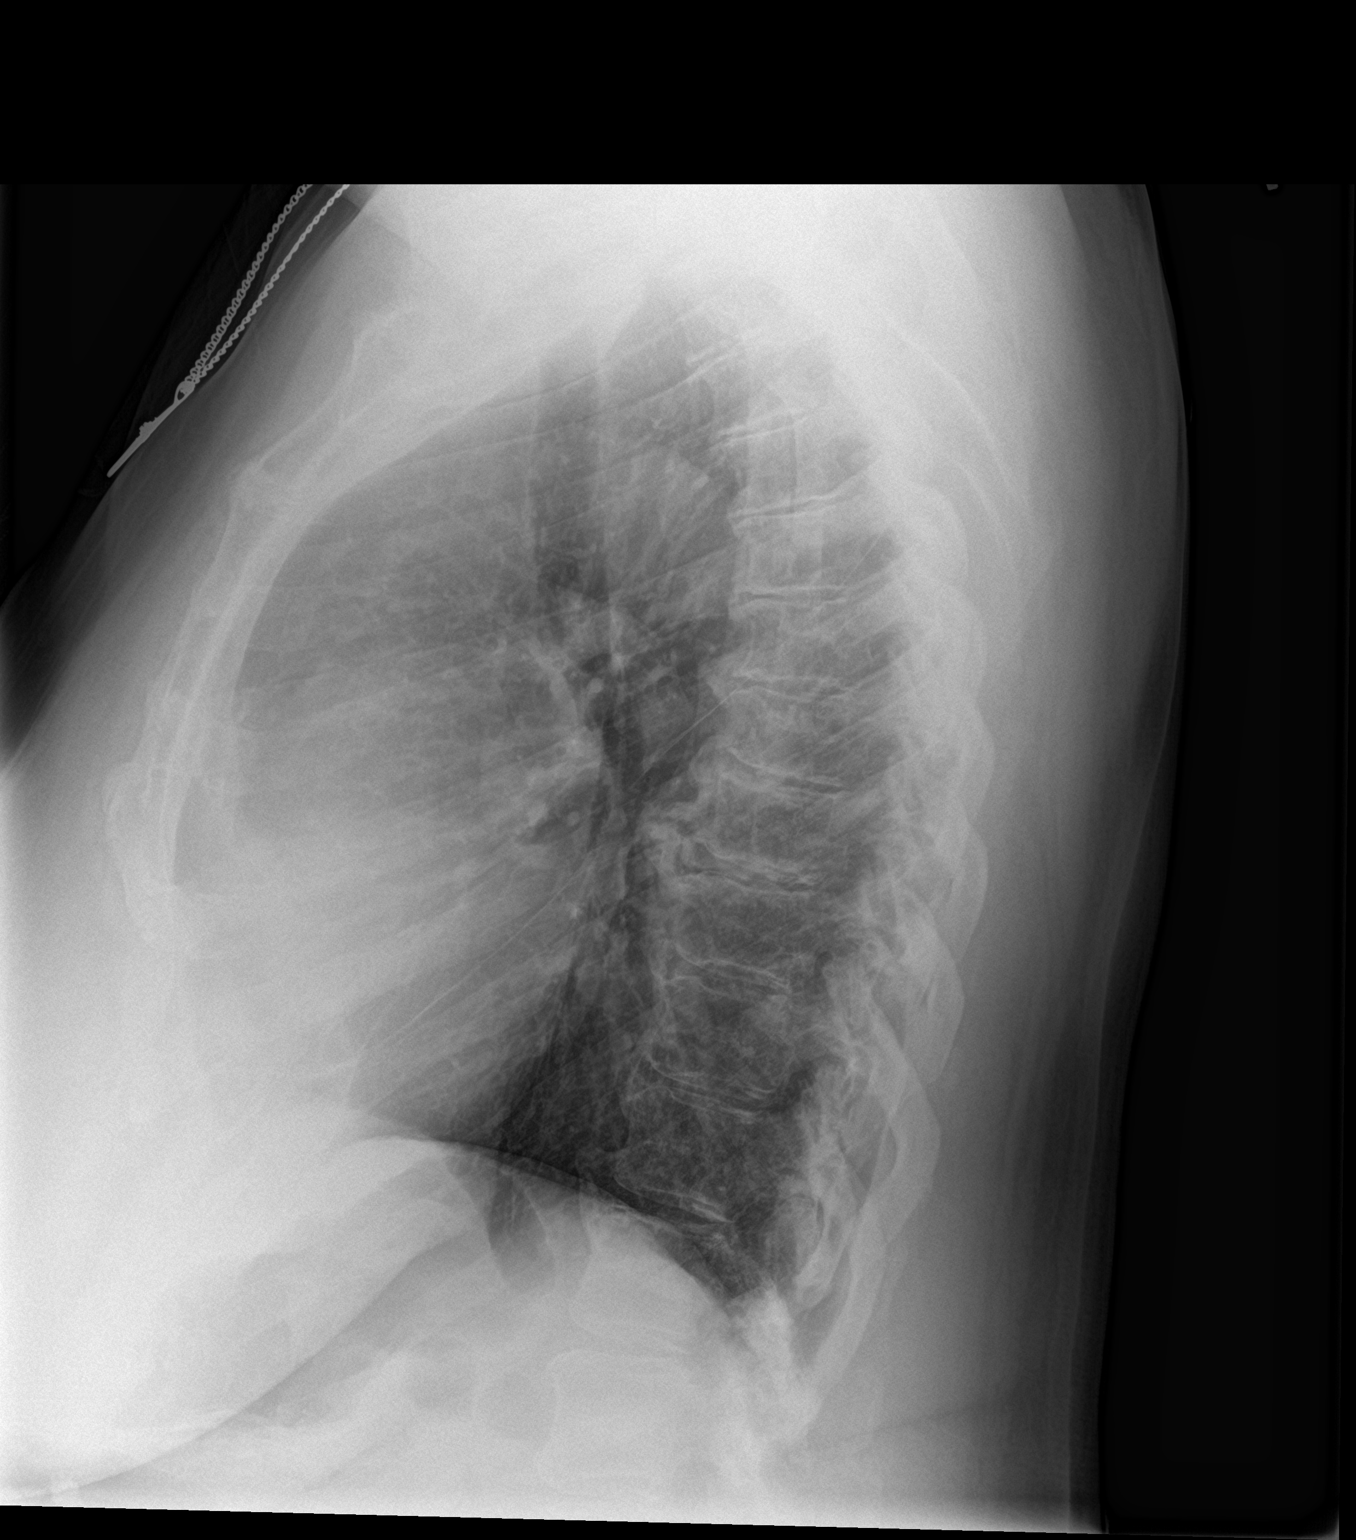

[2 of 2 positions shown; findings below may reference images not displayed]

FINDINGS: The lungs are clear. The heart size and pulmonary vascularity are
normal. No adenopathy. There is degenerative change in the thoracic
spine and in each shoulder. No blastic or lytic bone lesions.
IMPRESSION: No edema or consolidation.  No evident adenopathy.

## 2018-05-26 DIAGNOSIS — I10 Essential (primary) hypertension: Secondary | ICD-10-CM | POA: Diagnosis not present

## 2018-05-26 DIAGNOSIS — R42 Dizziness and giddiness: Secondary | ICD-10-CM | POA: Diagnosis not present

## 2018-05-26 DIAGNOSIS — N183 Chronic kidney disease, stage 3 (moderate): Secondary | ICD-10-CM | POA: Diagnosis not present

## 2018-05-26 DIAGNOSIS — Z681 Body mass index (BMI) 19 or less, adult: Secondary | ICD-10-CM | POA: Diagnosis not present

## 2018-05-26 DIAGNOSIS — R51 Headache: Secondary | ICD-10-CM | POA: Diagnosis not present

## 2018-05-26 DIAGNOSIS — E119 Type 2 diabetes mellitus without complications: Secondary | ICD-10-CM | POA: Diagnosis not present

## 2018-05-26 DIAGNOSIS — E669 Obesity, unspecified: Secondary | ICD-10-CM | POA: Diagnosis not present

## 2018-05-26 DIAGNOSIS — M1991 Primary osteoarthritis, unspecified site: Secondary | ICD-10-CM | POA: Diagnosis not present

## 2018-05-26 NOTE — ED Provider Notes (Signed)
[facility] EMERGENCY DEPARTMENT Provider Note   CSN: [id number] Arrival date & time: 05/13/18  1312     History   Chief Complaint Chief Complaint  Patient presents with  . Headache    HPI [name] is a 77 y.o. female.  HPI   76 year old female with headache.  Onset about a week ago.  Persistent since then.  Is a hard time describing her headache specifically.  Says that at discharge.  She has not noticed any appreciable exacerbating relieving factors.  Associated with fullness and popping sensation in her ears.  No drainage.  No vertigo.  No fevers or chills.  Denies any trauma.  No acute visual changes.  Past Medical History:  Diagnosis Date  . Breast cancer ([place])   . CKD (chronic kidney disease) stage 2, GFR 60-89 ml/min   . Essential hypertension   . Hyperlipidemia   . Type 2 diabetes mellitus ([place])    diet controlled    Patient Active Problem List   Diagnosis Date Noted  . Post-menopausal 12/07/2016  . Malignant neoplasm of upper-inner quadrant of right female breast ([place])   . Chest pain, rule out acute myocardial infarction 06/03/2014  . Chest pain 06/03/2014  . Essential hypertension 06/03/2014  . Hyperglycemia 06/03/2014  . Hyponatremia 06/03/2014    Past Surgical History:  Procedure Laterality Date  . ABDOMINAL HYSTERECTOMY    . BREAST BIOPSY Left    benign  . EYE SURGERY Left    cataract removal  . PARTIAL MASTECTOMY WITH NEEDLE LOCALIZATION AND AXILLARY SENTINEL LYMPH NODE BX Right 10/28/2016   Procedure: PARTIAL MASTECTOMY WITH NEEDLE LOCALIZATION AND AXILLARY SENTINEL LYMPH NODE BX;  Surgeon: Aviva Signs, MD;  Location: [facility];  Service: General;  Laterality: Right;     OB History    Gravida  3   Para  3   Term  3   Preterm      AB      Living        SAB      TAB      Ectopic      Multiple      Live Births               Home Medications    Prior to Admission medications   Medication Sig Start Date End Date  Taking? Authorizing Provider  amoxicillin (AMOXIL) 500 MG capsule Take 2 capsules (1,000 mg total) by mouth 2 (two) times daily. 05/13/18   Virgel Manifold, MD  anastrozole (ARIMIDEX) 1 MG tablet Take 1 tablet (1 mg total) by mouth daily. 12/14/17   Holley Bouche, NP  aspirin EC 81 MG tablet Take 1 tablet (81 mg total) by mouth daily. 06/03/14   Samuella Cota, MD  [place], Vaccinium myrtillus, (BILBERRY PO) Take 1,000 mg by mouth daily as needed (for eye health.).    [provider]  calcium-vitamin D (OSCAL WITH D) 500-200 MG-UNIT tablet Take 2 tablets by mouth daily with breakfast. 12/07/16   Twana First, MD  diltiazem [facility]) 120 MG 24 hr capsule Take 120 mg by mouth daily. 09/07/16   [provider]  loratadine (CLARITIN) 10 MG tablet Take 1 tablet (10 mg total) by mouth daily as needed (congestion/ear pressure). 05/13/18   Virgel Manifold, MD  Menthol-Camphor (TIGER BALM ARTHRITIS RUB) 11-11 % CREA Apply 1 application topically 3 (three) times daily as needed (for pain (back)).     [provider]    Family History Family History  Problem Relation Age of Onset  . Hypertension Mother   . Hypertension Father   . Diabetes Mellitus II Sister   . Diabetes Mellitus II Brother   . Diabetes Mellitus II Brother     Social History Social History   Tobacco Use  . Smoking status: Never Smoker  . Smokeless tobacco: Never Used  Substance Use Topics  . Alcohol use: No  . Drug use: No     Allergies   Patient has no known allergies.   Review of Systems Review of Systems  All systems reviewed and negative, other than as noted in HPI.  Physical Exam Updated Vital Signs BP (!) 197/88 (BP Location: Right Arm)   Pulse 72   Temp 98.1 F (36.7 C) (Oral)   Resp 16   Ht 5\' 3"  (1.6 m)   Wt 83.9 kg   SpO2 100%   BMI 32.77 kg/m   Physical Exam  Constitutional: She appears well-developed and well-nourished. No distress.  HENT:  Head: Normocephalic and  atraumatic.  Air-fluid levels bilaterally.  Left tympanic membrane is somewhat bulging, erythematous there is loss of bony landmarks.  External auditory canals are clear bilaterally.  Eyes: Conjunctivae are normal. Right eye exhibits no discharge. Left eye exhibits no discharge.  Neck: Neck supple.  Cardiovascular: Normal rate, regular rhythm and normal heart sounds. Exam reveals no gallop and no friction rub.  No murmur heard. Pulmonary/Chest: Effort normal and breath sounds normal. No respiratory distress.  Abdominal: Soft. She exhibits no distension. There is no tenderness.  Musculoskeletal: She exhibits no edema or tenderness.  Neurological: She is alert.  Skin: Skin is warm and dry.  Psychiatric: She has a normal mood and affect. Her behavior is normal. Thought content normal.  Nursing note and vitals reviewed.    ED Treatments / Results  Labs (all labs ordered are listed, but only abnormal results are displayed) Labs Reviewed - No data to display  EKG None  Radiology No results found.  Procedures Procedures (including critical care time)  Medications Ordered in ED Medications - No data to display   Initial Impression / Assessment and Plan / ED Course  I have reviewed the triage vital signs and the nursing notes.  Pertinent labs & imaging results that were available during my care of the patient were reviewed by me and considered in my medical decision making (see chart for details).    77 year old female with headaches.  May be secondary to otitis media.  She has air-fluid levels bilaterally but left TM the ultrasound is somewhat bulging, erythematous and there is loss of bony landmarks.  Decongestants then antibiotics.  Return precautions discussed.  Outpatient follow-up otherwise.  Final Clinical Impressions(s) / ED Diagnoses   Final diagnoses:  Fluid level behind tympanic membrane of right ear  Acute suppurative otitis media of left ear without spontaneous  rupture of tympanic membrane, recurrence not specified    ED Discharge Orders         Ordered    amoxicillin (AMOXIL) 500 MG capsule  2 times daily     05/13/18 1331    loratadine (CLARITIN) 10 MG tablet  Daily PRN     05/13/18 1331           Virgel Manifold, MD 05/26/18 1555

## 2018-05-27 ENCOUNTER — Other Ambulatory Visit (HOSPITAL_COMMUNITY): Payer: Self-pay | Admitting: Internal Medicine

## 2018-05-27 DIAGNOSIS — J329 Chronic sinusitis, unspecified: Secondary | ICD-10-CM

## 2018-05-27 DIAGNOSIS — R51 Headache: Principal | ICD-10-CM

## 2018-05-27 DIAGNOSIS — R519 Headache, unspecified: Secondary | ICD-10-CM

## 2018-06-07 ENCOUNTER — Ambulatory Visit (HOSPITAL_COMMUNITY): Payer: Medicare HMO

## 2018-06-20 ENCOUNTER — Encounter (HOSPITAL_COMMUNITY): Payer: Self-pay

## 2018-06-20 ENCOUNTER — Ambulatory Visit (HOSPITAL_COMMUNITY): Payer: Medicare HMO

## 2018-06-29 ENCOUNTER — Inpatient Hospital Stay (HOSPITAL_COMMUNITY): Payer: Medicare HMO | Attending: Hematology

## 2018-06-29 DIAGNOSIS — C50211 Malignant neoplasm of upper-inner quadrant of right female breast: Secondary | ICD-10-CM | POA: Insufficient documentation

## 2018-06-29 DIAGNOSIS — Z170 Estrogen receptor positive status [ER+]: Secondary | ICD-10-CM | POA: Insufficient documentation

## 2018-06-29 LAB — CBC WITH DIFFERENTIAL/PLATELET
Abs Immature Granulocytes: 0.02 10*3/uL (ref 0.00–0.07)
Basophils Absolute: 0 10*3/uL (ref 0.0–0.1)
Basophils Relative: 0 %
Eosinophils Absolute: 0.1 10*3/uL (ref 0.0–0.5)
Eosinophils Relative: 1 %
HCT: 39.3 % (ref 36.0–46.0)
Hemoglobin: 11.9 g/dL — ABNORMAL LOW (ref 12.0–15.0)
Immature Granulocytes: 0 %
Lymphocytes Relative: 20 %
Lymphs Abs: 1.1 10*3/uL (ref 0.7–4.0)
MCH: 27.7 pg (ref 26.0–34.0)
MCHC: 30.3 g/dL (ref 30.0–36.0)
MCV: 91.6 fL (ref 80.0–100.0)
Monocytes Absolute: 0.4 10*3/uL (ref 0.1–1.0)
Monocytes Relative: 8 %
Neutro Abs: 3.8 10*3/uL (ref 1.7–7.7)
Neutrophils Relative %: 71 %
Platelets: 282 10*3/uL (ref 150–400)
RBC: 4.29 MIL/uL (ref 3.87–5.11)
RDW: 13.2 % (ref 11.5–15.5)
WBC: 5.4 10*3/uL (ref 4.0–10.5)
nRBC: 0 % (ref 0.0–0.2)

## 2018-06-29 LAB — COMPREHENSIVE METABOLIC PANEL
ALT: 21 U/L (ref 0–44)
AST: 25 U/L (ref 15–41)
Albumin: 4 g/dL (ref 3.5–5.0)
Alkaline Phosphatase: 88 U/L (ref 38–126)
Anion gap: 11 (ref 5–15)
BUN: 19 mg/dL (ref 8–23)
CO2: 27 mmol/L (ref 22–32)
Calcium: 9.8 mg/dL (ref 8.9–10.3)
Chloride: 102 mmol/L (ref 98–111)
Creatinine, Ser: 1.16 mg/dL — ABNORMAL HIGH (ref 0.44–1.00)
GFR calc Af Amer: 51 mL/min — ABNORMAL LOW (ref >60)
GFR calc non Af Amer: 44 mL/min — ABNORMAL LOW (ref >60)
Glucose, Bld: 160 mg/dL — ABNORMAL HIGH (ref 70–99)
Potassium: 4.9 mmol/L (ref 3.5–5.1)
Sodium: 140 mmol/L (ref 135–145)
Total Bilirubin: 0.8 mg/dL (ref 0.3–1.2)
Total Protein: 7.9 g/dL (ref 6.5–8.1)

## 2018-06-29 LAB — LACTATE DEHYDROGENASE: LDH: 153 U/L (ref 98–192)

## 2018-07-05 ENCOUNTER — Inpatient Hospital Stay (HOSPITAL_COMMUNITY): Payer: Medicare HMO | Attending: Hematology | Admitting: Internal Medicine

## 2018-07-05 ENCOUNTER — Encounter (HOSPITAL_COMMUNITY): Payer: Self-pay | Admitting: Internal Medicine

## 2018-07-05 VITALS — BP 164/73 | HR 74 | Temp 98.5°F | Resp 18 | Wt 189.0 lb

## 2018-07-05 DIAGNOSIS — Z79811 Long term (current) use of aromatase inhibitors: Secondary | ICD-10-CM | POA: Diagnosis not present

## 2018-07-05 DIAGNOSIS — Z853 Personal history of malignant neoplasm of breast: Secondary | ICD-10-CM

## 2018-07-05 DIAGNOSIS — Z170 Estrogen receptor positive status [ER+]: Secondary | ICD-10-CM | POA: Diagnosis not present

## 2018-07-05 DIAGNOSIS — Z90710 Acquired absence of both cervix and uterus: Secondary | ICD-10-CM | POA: Diagnosis not present

## 2018-07-05 DIAGNOSIS — R0602 Shortness of breath: Secondary | ICD-10-CM

## 2018-07-05 DIAGNOSIS — I10 Essential (primary) hypertension: Secondary | ICD-10-CM | POA: Insufficient documentation

## 2018-07-05 DIAGNOSIS — Z79899 Other long term (current) drug therapy: Secondary | ICD-10-CM | POA: Diagnosis not present

## 2018-07-05 DIAGNOSIS — C50211 Malignant neoplasm of upper-inner quadrant of right female breast: Secondary | ICD-10-CM | POA: Diagnosis not present

## 2018-07-05 MED ORDER — ANASTROZOLE 1 MG PO TABS: 1.0000 mg | ORAL_TABLET | Freq: Every day | ORAL | 4 refills | Status: DC

## 2018-07-05 NOTE — Patient Instructions (Addendum)
[place] at [facility]  Discharge Instructions:  You were seen by Dr. Walden Field today.  We will make a referral to the [place] group. They are lung doctors. We will schedule your mammogram for Feb and follow up visit afterwards.    _______________________________________________________________  Thank you for choosing [place] at [facility] to provide your oncology and hematology care.  To afford each patient quality time with our providers, please arrive at least 15 minutes before your scheduled appointment.  You need to re-schedule your appointment if you arrive 10 or more minutes late.  We strive to give you quality time with our providers, and arriving late affects you and other patients whose appointments are after yours.  Also, if you no show three or more times for appointments you may be dismissed from the clinic.  Again, thank you for choosing [place] at [place] hope is that these requests will allow you access to exceptional care and in a timely manner. _______________________________________________________________  If you have questions after your visit, please contact our office at [phone] between the hours of 8:30 a.m. and 5:00 p.m. Voicemails left after 4:30 p.m. will not be returned until the following business day. _______________________________________________________________  For prescription refill requests, have your pharmacy contact our office. _______________________________________________________________  Recommendations made by the consultant and any test results will be sent to your referring physician. _______________________________________________________________

## 2018-07-05 NOTE — Progress Notes (Signed)
Diagnosis Malignant neoplasm of upper-inner quadrant of right breast in female, estrogen receptor positive ([place]) - Plan: MM Digital Diagnostic Bilat, CBC with Differential/Platelet, Comprehensive metabolic panel, Lactate dehydrogenase  History of left breast cancer - Plan: MM Digital Diagnostic Bilat, CBC with Differential/Platelet, Comprehensive metabolic panel, Lactate dehydrogenase  Staging Cancer Staging Malignant neoplasm of upper-inner quadrant of right female breast (HCC) Staging form: Breast, AJCC 8th Edition - Clinical: Stage IA (cT1b, cN0(sn), cM0, G1, ER: Positive, PR: Positive, HER2: Negative) - Signed by Twana First, MD on 12/07/2016   Assessment and Plan:  1.  Stage IA invasive ductal carcinoma of (R) breast; ER+/PR+/HER2-.  Pt was diiagnosed in 08/2016 after abnormal screening mammogram.  She was followed by Dr. Talbert Cage.  She was treated with (R) lumpectomy with SLNB with Dr. Arnoldo Morale on 10/28/16. Dr. Talbert Cage did not think patient would benefit from adjuvant radiation therapy and thus she was started on anti-estrogen therapy with Arimidex in 11/2016. She is planned for 5-10 years of therapy.   Bilateral diagnostic mammogram done 10/26/2017 was negative other than lumpectomy changes  and pt is recommended for bilateral diagnostic mammogram in 10/2018 which is set up today  She will RTC in 10/2018 to for follow-up at labs with mammogram results.  Rx for Arimidex # 90 sent to pharmacy with 4 refills.    2.  Pulmonary lesions. Pt underwent CTA due to chest and back pain and SOB  on 12/20/2017 in the ER  that showed  IMPRESSION: 1. No evidence of pulmonary embolism or other acute findings in the chest. 2. Multiple bilateral partially calcified pleural plaques are consistent with prior asbestos exposure. No evidence of associated asbestosis, pleural tumor or other pulmonary abnormalities.  She denies any history of smoking.  She was referred to pulmonary for evaluation and to determine if pt  needs further workup.   Unclear if pt went for evaluation after review of chart.  Pt is referred to [facility] for evaluation.    3.  SOB.  Pulse ox is 100% on room air.  Pt is referred to pulmonary for evaluation due to CT findings.    4.  HTN:  BP is 164/73.   Follow-up with PCP.    5.  Health maintenance.  Follow-up with GI as recommended.  BMD done 04/12/2017 was normal.  She will have repeat BMD in 04/2019.  Continue calcium and vitamin D.    25 minutes spent with more than 50% spent in counseling and coordination of care.    Current Status:  Pt is seen today for follow-up.     Problem List Patient Active Problem List   Diagnosis Date Noted  . Post-menopausal [Z78.0] 12/07/2016  . Malignant neoplasm of upper-inner quadrant of right female breast ([place]) [C50.211]   . Chest pain, rule out acute myocardial infarction [R07.9] 06/03/2014  . Chest pain [R07.9] 06/03/2014  . Essential hypertension [I10] 06/03/2014  . Hyperglycemia [R73.9] 06/03/2014  . Hyponatremia [E87.1] 06/03/2014    Past Medical History Past Medical History:  Diagnosis Date  . Breast cancer ([place])   . CKD (chronic kidney disease) stage 2, GFR 60-89 ml/min   . Essential hypertension   . Hyperlipidemia   . Type 2 diabetes mellitus (HCC)    diet controlled    Past Surgical History Past Surgical History:  Procedure Laterality Date  . ABDOMINAL HYSTERECTOMY    . BREAST BIOPSY Left    benign  . EYE SURGERY Left    cataract removal  . PARTIAL MASTECTOMY  WITH NEEDLE LOCALIZATION AND AXILLARY SENTINEL LYMPH NODE BX Right 10/28/2016   Procedure: PARTIAL MASTECTOMY WITH NEEDLE LOCALIZATION AND AXILLARY SENTINEL LYMPH NODE BX;  Surgeon: Aviva Signs, MD;  Location: [facility];  Service: General;  Laterality: Right;    Family History Family History  Problem Relation Age of Onset  . Hypertension Mother   . Hypertension Father   . Diabetes Mellitus II Sister   . Diabetes Mellitus II Brother   . Diabetes  Mellitus II Brother      Social History  reports that she has never smoked. She has never used smokeless tobacco. She reports that she does not drink alcohol or use drugs.  Medications  Current Outpatient Medications:  .  anastrozole (ARIMIDEX) 1 MG tablet, Take 1 tablet (1 mg total) by mouth daily., Disp: 90 tablet, Rfl: 3 .  aspirin EC 81 MG tablet, Take 1 tablet (81 mg total) by mouth daily., Disp: , Rfl:  .  Bilberry, Vaccinium myrtillus, (BILBERRY PO), Take 1,000 mg by mouth daily as needed (for eye health.)., Disp: , Rfl:  .  calcium-vitamin D (OSCAL WITH D) 500-200 MG-UNIT tablet, Take 2 tablets by mouth daily with breakfast., Disp: 60 tablet, Rfl: 6 .  diltiazem (TIAZAC) 120 MG 24 hr capsule, Take 120 mg by mouth daily., Disp: , Rfl: 1 .  loratadine (CLARITIN) 10 MG tablet, Take 1 tablet (10 mg total) by mouth daily as needed (congestion/ear pressure)., Disp: 10 tablet, Rfl: 0 .  Menthol-Camphor (TIGER BALM ARTHRITIS RUB) 11-11 % CREA, Apply 1 application topically 3 (three) times daily as needed (for pain (back)). , Disp: , Rfl:   Allergies Patient has no known allergies.  Review of Systems Review of Systems - Oncology ROS negative other than SOB.     Physical Exam  Vitals Wt Readings from Last 3 Encounters:  07/05/18 189 lb (85.7 kg)  05/13/18 185 lb (83.9 kg)  01/26/18 184 lb (83.5 kg)   Temp Readings from Last 3 Encounters:  07/05/18 98.5 F (36.9 C) (Oral)  05/13/18 98.1 F (36.7 C) (Oral)  01/03/18 97.9 F (36.6 C) (Oral)   BP Readings from Last 3 Encounters:  07/05/18 (!) 164/73  05/13/18 (!) 197/88  01/26/18 (!) 164/84   Pulse Readings from Last 3 Encounters:  07/05/18 74  05/13/18 72  01/26/18 71   Constitutional: Well-developed, well-nourished, and in no distress.   HENT: Head: Normocephalic and atraumatic.  Mouth/Throat: No oropharyngeal exudate. Mucosa moist. Eyes: Pupils are equal, round, and reactive to light. Conjunctivae are normal. No  scleral icterus.  Neck: Normal range of motion. Neck supple. No JVD present.  Cardiovascular: Normal rate, regular rhythm and normal heart sounds.  Exam reveals no gallop and no friction rub.   No murmur heard. Pulmonary/Chest: Effort normal and breath sounds normal. No respiratory distress. No wheezes.No rales.  Abdominal: Soft. Bowel sounds are normal. No distension. There is no tenderness. There is no guarding.  Musculoskeletal: No edema or tenderness.  Lymphadenopathy: No cervical, axillary or supraclavicular adenopathy.  Neurological: Alert and oriented to person, place, and time. No cranial nerve deficit.  Skin: Skin is warm and dry. No rash noted. No erythema. No pallor.  Psychiatric: Affect and judgment normal.  Breast exam:  Chaperone present.  Evidence of prior breast surgeries noted bilaterally.  No dominant masses palpable bilaterally.    Labs No visits with results within 3 Day(s) from this visit.  Latest known visit with results is:  Appointment on 06/29/2018  Component Date Value  Ref Range Status  . WBC 06/29/2018 5.4  4.0 - 10.5 K/uL Final  . RBC 06/29/2018 4.29  3.87 - 5.11 MIL/uL Final  . Hemoglobin 06/29/2018 11.9* 12.0 - 15.0 g/dL Final  . HCT 06/29/2018 39.3  36.0 - 46.0 % Final  . MCV 06/29/2018 91.6  80.0 - 100.0 fL Final  . MCH 06/29/2018 27.7  26.0 - 34.0 pg Final  . MCHC 06/29/2018 30.3  30.0 - 36.0 g/dL Final  . RDW 06/29/2018 13.2  11.5 - 15.5 % Final  . Platelets 06/29/2018 282  150 - 400 K/uL Final  . nRBC 06/29/2018 0.0  0.0 - 0.2 % Final  . Neutrophils Relative % 06/29/2018 71  % Final  . Neutro Abs 06/29/2018 3.8  1.7 - 7.7 K/uL Final  . Lymphocytes Relative 06/29/2018 20  % Final  . Lymphs Abs 06/29/2018 1.1  0.7 - 4.0 K/uL Final  . Monocytes Relative 06/29/2018 8  % Final  . Monocytes Absolute 06/29/2018 0.4  0.1 - 1.0 K/uL Final  . Eosinophils Relative 06/29/2018 1  % Final  . Eosinophils Absolute 06/29/2018 0.1  0.0 - 0.5 K/uL Final  .  Basophils Relative 06/29/2018 0  % Final  . Basophils Absolute 06/29/2018 0.0  0.0 - 0.1 K/uL Final  . Immature Granulocytes 06/29/2018 0  % Final  . Abs Immature Granulocytes 06/29/2018 0.02  0.00 - 0.07 K/uL Final   Performed at [facility], [address] [place], [place] [address]  . Sodium 06/29/2018 140  135 - 145 mmol/L Final  . Potassium 06/29/2018 4.9  3.5 - 5.1 mmol/L Final  . Chloride 06/29/2018 102  98 - 111 mmol/L Final  . CO2 06/29/2018 27  22 - 32 mmol/L Final  . Glucose, Bld 06/29/2018 160* 70 - 99 mg/dL Final  . BUN 06/29/2018 19  8 - 23 mg/dL Final  . Creatinine, Ser 06/29/2018 1.16* 0.44 - 1.00 mg/dL Final  . Calcium 06/29/2018 9.8  8.9 - 10.3 mg/dL Final  . Total Protein 06/29/2018 7.9  6.5 - 8.1 g/dL Final  . Albumin 06/29/2018 4.0  3.5 - 5.0 g/dL Final  . AST 06/29/2018 25  15 - 41 U/L Final  . ALT 06/29/2018 21  0 - 44 U/L Final  . Alkaline Phosphatase 06/29/2018 88  38 - 126 U/L Final  . Total Bilirubin 06/29/2018 0.8  0.3 - 1.2 mg/dL Final  . GFR calc non Af Amer 06/29/2018 44* >60 mL/min Final  . GFR calc Af Amer 06/29/2018 51* >60 mL/min Final   Comment: (NOTE) The eGFR has been calculated using the CKD EPI equation. This calculation has not been validated in all clinical situations. eGFR's persistently <60 mL/min signify possible Chronic Kidney Disease.   Georgiann Hahn gap 06/29/2018 11  5 - 15 Final   Performed at [facility], [address] [place], [place] [address]  . LDH 06/29/2018 153  98 - 192 U/L Final   Performed at [facility], [address], [place],  [address]     Pathology Orders Placed This Encounter  Procedures  . MM Digital Diagnostic Bilat    Standing Status:   Future    Standing Expiration Date:   07/05/2019    Order Specific Question:   Reason for Exam (SYMPTOM  OR DIAGNOSIS REQUIRED)    Answer:   bilateral breast cancer    Order Specific Question:   Preferred imaging location?    Answer:   [facility]  . CBC with  Differential/Platelet    Standing Status:   Future    Standing Expiration Date:   07/05/2020  . Comprehensive metabolic panel    Standing Status:   Future    Standing Expiration Date:   07/05/2020  . Lactate dehydrogenase    Standing Status:   Future    Standing Expiration Date:   07/05/2020       Zoila Shutter MD

## 2018-07-08 ENCOUNTER — Ambulatory Visit (HOSPITAL_COMMUNITY): Payer: Medicare HMO | Admitting: Internal Medicine

## 2018-07-08 ENCOUNTER — Ambulatory Visit (HOSPITAL_COMMUNITY): Payer: Medicare HMO | Admitting: Hematology

## 2018-07-19 DIAGNOSIS — E669 Obesity, unspecified: Secondary | ICD-10-CM | POA: Diagnosis not present

## 2018-07-19 DIAGNOSIS — I10 Essential (primary) hypertension: Secondary | ICD-10-CM | POA: Diagnosis not present

## 2018-07-19 DIAGNOSIS — Z6833 Body mass index (BMI) 33.0-33.9, adult: Secondary | ICD-10-CM | POA: Diagnosis not present

## 2018-07-19 DIAGNOSIS — Z2821 Immunization not carried out because of patient refusal: Secondary | ICD-10-CM | POA: Diagnosis not present

## 2018-07-19 DIAGNOSIS — Z0189 Encounter for other specified special examinations: Secondary | ICD-10-CM | POA: Diagnosis not present

## 2018-08-02 DIAGNOSIS — M25561 Pain in right knee: Secondary | ICD-10-CM | POA: Diagnosis not present

## 2018-08-02 DIAGNOSIS — R7301 Impaired fasting glucose: Secondary | ICD-10-CM | POA: Diagnosis not present

## 2018-08-02 DIAGNOSIS — E559 Vitamin D deficiency, unspecified: Secondary | ICD-10-CM | POA: Diagnosis not present

## 2018-08-02 DIAGNOSIS — E669 Obesity, unspecified: Secondary | ICD-10-CM | POA: Diagnosis not present

## 2018-08-02 DIAGNOSIS — I10 Essential (primary) hypertension: Secondary | ICD-10-CM | POA: Diagnosis not present

## 2018-08-03 DIAGNOSIS — E119 Type 2 diabetes mellitus without complications: Secondary | ICD-10-CM | POA: Diagnosis not present

## 2018-08-05 DIAGNOSIS — Z0000 Encounter for general adult medical examination without abnormal findings: Secondary | ICD-10-CM | POA: Diagnosis not present

## 2018-08-05 DIAGNOSIS — E782 Mixed hyperlipidemia: Secondary | ICD-10-CM | POA: Diagnosis not present

## 2018-08-05 DIAGNOSIS — E669 Obesity, unspecified: Secondary | ICD-10-CM | POA: Diagnosis not present

## 2018-08-05 DIAGNOSIS — I10 Essential (primary) hypertension: Secondary | ICD-10-CM | POA: Diagnosis not present

## 2018-08-05 DIAGNOSIS — N183 Chronic kidney disease, stage 3 (moderate): Secondary | ICD-10-CM | POA: Diagnosis not present

## 2018-08-05 DIAGNOSIS — Z712 Person consulting for explanation of examination or test findings: Secondary | ICD-10-CM | POA: Diagnosis not present

## 2018-08-05 DIAGNOSIS — E875 Hyperkalemia: Secondary | ICD-10-CM | POA: Diagnosis not present

## 2018-08-05 DIAGNOSIS — R7301 Impaired fasting glucose: Secondary | ICD-10-CM | POA: Diagnosis not present

## 2018-08-05 DIAGNOSIS — Z6833 Body mass index (BMI) 33.0-33.9, adult: Secondary | ICD-10-CM | POA: Diagnosis not present

## 2018-08-17 ENCOUNTER — Encounter: Payer: Self-pay | Admitting: Orthopaedic Surgery

## 2018-08-17 ENCOUNTER — Encounter: Payer: Self-pay | Admitting: Orthopedic Surgery

## 2018-08-17 ENCOUNTER — Ambulatory Visit (INDEPENDENT_AMBULATORY_CARE_PROVIDER_SITE_OTHER): Payer: Medicare HMO | Admitting: Orthopedic Surgery

## 2018-08-17 ENCOUNTER — Ambulatory Visit: Payer: Medicare HMO | Admitting: Orthopaedic Surgery

## 2018-08-17 VITALS — BP 176/87 | HR 69 | Ht 63.0 in | Wt 189.0 lb

## 2018-08-17 DIAGNOSIS — M25532 Pain in left wrist: Secondary | ICD-10-CM

## 2018-08-17 DIAGNOSIS — G5602 Carpal tunnel syndrome, left upper limb: Secondary | ICD-10-CM | POA: Diagnosis not present

## 2018-08-17 NOTE — Patient Instructions (Signed)
Carpal Tunnel Syndrome  Carpal tunnel syndrome is a condition that causes pain in your hand and arm. The carpal tunnel is a narrow area located on the palm side of your wrist. Repeated wrist motion or certain diseases may cause swelling within the tunnel. This swelling pinches the main nerve in the wrist (median nerve). What are the causes? This condition may be caused by:  Repeated wrist motions.  Wrist injuries.  Arthritis.  A cyst or tumor in the carpal tunnel.  Fluid buildup during pregnancy. Sometimes the cause of this condition is not known. What increases the risk? The following factors may make you more likely to develop this condition:  Having a job, such as being a butcher or a cashier, that requires you to repeatedly move your wrist in the same motion.  Being a woman.  Having certain conditions, such as: ? Diabetes. ? Obesity. ? An underactive thyroid (hypothyroidism). ? Kidney failure. What are the signs or symptoms? Symptoms of this condition include:  A tingling feeling in your fingers, especially in your thumb, index, and middle fingers.  Tingling or numbness in your hand.  An aching feeling in your entire arm, especially when your wrist and elbow are bent for a long time.  Wrist pain that goes up your arm to your shoulder.  Pain that goes down into your palm or fingers.  A weak feeling in your hands. You may have trouble grabbing and holding items. Your symptoms may feel worse during the night. How is this diagnosed? This condition is diagnosed with a medical history and physical exam. You may also have tests, including:  Electromyogram (EMG). This test measures electrical signals sent by your nerves into the muscles.  Nerve conduction study. This test measures how well electrical signals pass through your nerves.  Imaging tests, such as X-rays, ultrasound, and MRI. These tests check for possible causes of your condition. How is this treated? This  condition may be treated with:  Lifestyle changes. It is important to stop or change the activity that caused your condition.  Doing exercise and activities to strengthen your muscles and bones (physical therapy).  Learning how to use your hand again after diagnosis (occupational therapy).  Medicines for pain and inflammation. This may include medicine that is injected into your wrist.  A wrist splint.  Surgery. Follow these instructions at home: If you have a splint:  Wear the splint as told by your health care provider. Remove it only as told by your health care provider.  Loosen the splint if your fingers tingle, become numb, or turn cold and blue.  Keep the splint clean.  If the splint is not waterproof: ? Do not let it get wet. ? Cover it with a watertight covering when you take a bath or shower. Managing pain, stiffness, and swelling   If directed, put ice on the painful area: ? If you have a removable splint, remove it as told by your health care provider. ? Put ice in a plastic bag. ? Place a towel between your skin and the bag. ? Leave the ice on for 20 minutes, 2-3 times per day. General instructions  Take over-the-counter and prescription medicines only as told by your health care provider.  Rest your wrist from any activity that may be causing your pain. If your condition is work related, talk with your employer about changes that can be made, such as getting a wrist pad to use while typing.  Do any exercises as told   can be made, such as getting a wrist pad to use while typing.  Do any exercises as told by your health care provider, physical therapist, or occupational therapist.  Keep all follow-up visits as told by your health care provider. This is important.  Contact a health care provider if:  You have new symptoms.  Your pain is not controlled with medicines.  Your symptoms get worse.  Get help right away if:  You have severe numbness or tingling in your wrist or hand.  Summary  Carpal tunnel syndrome is a condition that causes pain in your hand and arm.  It is usually caused by repeated wrist motions.  Lifestyle changes and  medicines are used to treat carpal tunnel syndrome. Surgery may be recommended.  Follow your health care provider's instructions about wearing a splint, resting from activity, keeping follow-up visits, and calling for help.  This information is not intended to replace advice given to you by your health care provider. Make sure you discuss any questions you have with your health care provider.  Document Released: 08/14/2000 Document Revised: 12/24/2017 Document Reviewed: 12/24/2017  Elsevier Interactive Patient Education  2019 [employer] Inc[employer]

## 2018-08-17 NOTE — Progress Notes (Signed)
Addendum  Dr. Luna Glasgow  asked me to see the patient regarding a left carpal tunnel syndrome.  Patient agreed to have surgery for left carpal tunnel release  The procedure has been fully reviewed with the patient; The risks and benefits of surgery have been discussed and explained and understood. Alternative treatment has also been reviewed, questions were encouraged and answered. The postoperative plan is also been reviewed.  Chief Complaint  Patient presents with  . Carpal Tunnel    left/ surgical consult per Dr Luna Glasgow wants surgery, declines nerve conduction study    77 year old female status post right carpal tunnel release years ago comes in with left upper extremity pain numbness tingling dropping things weakness without any significant nighttime symptoms.  She is diabetic her hemoglobin A1c is 7.9 no history of hypertension does not smoke or drink  She wishes to have the carpal tunnel released if possible  Past Medical History:  Diagnosis Date  . Breast cancer ([place])   . CKD (chronic kidney disease) stage 2, GFR 60-89 ml/min   . Essential hypertension   . Hyperlipidemia   . Type 2 diabetes mellitus (HCC)    diet controlled   Past Surgical History:  Procedure Laterality Date  . ABDOMINAL HYSTERECTOMY    . BREAST BIOPSY Left    benign  . EYE SURGERY Left    cataract removal  . PARTIAL MASTECTOMY WITH NEEDLE LOCALIZATION AND AXILLARY SENTINEL LYMPH NODE BX Right 10/28/2016   Procedure: PARTIAL MASTECTOMY WITH NEEDLE LOCALIZATION AND AXILLARY SENTINEL LYMPH NODE BX;  Surgeon: Aviva Signs, MD;  Location: [facility];  Service: General;  Laterality: Right;   Family History  Problem Relation Age of Onset  . Hypertension Mother   . Hypertension Father   . Diabetes Mellitus II Sister   . Diabetes Mellitus II Brother   . Diabetes Mellitus II Brother    Social History   Tobacco Use  . Smoking status: Never Smoker  . Smokeless tobacco: Never Used  Substance Use Topics  . Alcohol  use: No  . Drug use: No   Physical Exam Vitals signs reviewed.  Constitutional:      General: She is not in acute distress.    Appearance: She is well-developed. She is not diaphoretic.  HENT:     Head: Normocephalic and atraumatic.     Right Ear: External ear normal.     Left Ear: External ear normal.     Nose: Nose normal.     Mouth/Throat:     Pharynx: No oropharyngeal exudate.  Eyes:     General: No scleral icterus.       Right eye: No discharge.        Left eye: No discharge.     Conjunctiva/sclera: Conjunctivae normal.     Pupils: Pupils are equal, round, and reactive to light.  Neck:     Musculoskeletal: Normal range of motion and neck supple.     Thyroid: No thyromegaly.     Vascular: No JVD.     Trachea: No tracheal deviation.  Cardiovascular:     Rate and Rhythm: Normal rate and regular rhythm.     Chest Wall: PMI is not displaced.  Pulmonary:     Effort: Pulmonary effort is normal. No respiratory distress.     Breath sounds: No stridor. No wheezing.  Chest:     Chest wall: No tenderness.  Abdominal:     General: Bowel sounds are normal. There is no distension.     Palpations: Abdomen  is soft. There is no mass.     Tenderness: There is no abdominal tenderness. There is no rebound.  Musculoskeletal:       Arms:  Lymphadenopathy:     Cervical: No cervical adenopathy.  Skin:    General: Skin is warm and dry.     Capillary Refill: Capillary refill takes less than 2 seconds.     Findings: No rash.     Nails: There is no clubbing.   Neurological:     Mental Status: She is alert and oriented to person, place, and time.     Cranial Nerves: No cranial nerve deficit.     Sensory: No sensory deficit.     Motor: No abnormal muscle tone.     Coordination: Coordination normal.     Deep Tendon Reflexes: Reflexes are normal and symmetric. Reflexes normal.  Psychiatric:        Behavior: Behavior normal.        Thought Content: Thought content normal.         Judgment: Judgment normal.    Carpal tunnel syndrome   Again patient for left carpal tunnel release

## 2018-08-17 NOTE — Progress Notes (Signed)
Subjective:    Patient ID: [name], female    DOB: Sep 04, 1940, 77 y.o.   MRN: [id number]  HPI She has nocturnal numbness of the left hand, pain in the median nerve distribution.  She has had this for some time.  She has had carpal tunnel release on the right several years ago in [place].  She has no trauma.  She has no redness.  She has a glove she uses at night to help the pain.  She would like to have surgery on the wrist.   Review of Systems  Constitutional: Positive for activity change.  Musculoskeletal: Positive for arthralgias.  All other systems reviewed and are negative.  For Review of Systems, all other systems reviewed and are negative.  The following is a summary of the past history medically, past history surgically, known current medicines, social history and family history.  This information is gathered electronically by the computer from prior information and documentation.  I review this each visit and have found including this information at this point in the chart is beneficial and informative.   Past Medical History:  Diagnosis Date  . Breast cancer ([place])   . CKD (chronic kidney disease) stage 2, GFR 60-89 ml/min   . Essential hypertension   . Hyperlipidemia   . Type 2 diabetes mellitus (HCC)    diet controlled    Past Surgical History:  Procedure Laterality Date  . ABDOMINAL HYSTERECTOMY    . BREAST BIOPSY Left    benign  . EYE SURGERY Left    cataract removal  . PARTIAL MASTECTOMY WITH NEEDLE LOCALIZATION AND AXILLARY SENTINEL LYMPH NODE BX Right 10/28/2016   Procedure: PARTIAL MASTECTOMY WITH NEEDLE LOCALIZATION AND AXILLARY SENTINEL LYMPH NODE BX;  Surgeon: Aviva Signs, MD;  Location: [facility];  Service: General;  Laterality: Right;    Current Outpatient Medications on File Prior to Visit  Medication Sig Dispense Refill  . anastrozole (ARIMIDEX) 1 MG tablet Take 1 tablet (1 mg total) by mouth daily. 90 tablet 4  . aspirin EC 81 MG tablet  Take 1 tablet (81 mg total) by mouth daily.    Floria Raveling, Vaccinium myrtillus, (BILBERRY PO) Take 1,000 mg by mouth daily as needed (for eye health.).    Marland Kitchen calcium-vitamin D (OSCAL WITH D) 500-200 MG-UNIT tablet Take 2 tablets by mouth daily with breakfast. 60 tablet 6  . diltiazem (TIAZAC) 120 MG 24 hr capsule Take 120 mg by mouth daily.  1  . loratadine (CLARITIN) 10 MG tablet Take 1 tablet (10 mg total) by mouth daily as needed (congestion/ear pressure). 10 tablet 0  . Menthol-Camphor (TIGER BALM ARTHRITIS RUB) 11-11 % CREA Apply 1 application topically 3 (three) times daily as needed (for pain (back)).      No current facility-administered medications on file prior to visit.     Social History   Socioeconomic History  . Marital status: Widowed    Spouse name: Not on file  . Number of children: Not on file  . Years of education: Not on file  . Highest education level: Not on file  Occupational History  . Not on file  Social Needs  . Financial resource strain: Not on file  . Food insecurity:    Worry: Not on file    Inability: Not on file  . Transportation needs:    Medical: Not on file    Non-medical: Not on file  Tobacco Use  . Smoking status: Never Smoker  . Smokeless tobacco:  Never Used  Substance and Sexual Activity  . Alcohol use: No  . Drug use: No  . Sexual activity: Not Currently    Birth control/protection: Surgical  Lifestyle  . Physical activity:    Days per week: Not on file    Minutes per session: Not on file  . Stress: Not on file  Relationships  . Social connections:    Talks on phone: Not on file    Gets together: Not on file    Attends religious service: Not on file    Active member of club or organization: Not on file    Attends meetings of clubs or organizations: Not on file    Relationship status: Not on file  . Intimate partner violence:    Fear of current or ex partner: Not on file    Emotionally abused: Not on file    Physically abused:  Not on file    Forced sexual activity: Not on file  Other Topics Concern  . Not on file  Social History Narrative  . Not on file    Family History  Problem Relation Age of Onset  . Hypertension Mother   . Hypertension Father   . Diabetes Mellitus II Sister   . Diabetes Mellitus II Brother   . Diabetes Mellitus II Brother     BP (!) 176/87   Pulse 69   Ht 5\' 3"  (1.6 m)   Wt 189 lb (85.7 kg)   BMI 33.48 kg/m   Body mass index is 33.48 kg/m.      Objective:   Physical Exam Constitutional:      Appearance: She is well-developed.  HENT:     Head: Normocephalic and atraumatic.  Eyes:     Conjunctiva/sclera: Conjunctivae normal.     Pupils: Pupils are equal, round, and reactive to light.  Neck:     Musculoskeletal: Normal range of motion and neck supple.  Cardiovascular:     Rate and Rhythm: Normal rate and regular rhythm.  Pulmonary:     Effort: Pulmonary effort is normal.  Abdominal:     Palpations: Abdomen is soft.  Musculoskeletal:       Arms:  Skin:    General: Skin is warm and dry.  Neurological:     Mental Status: She is alert and oriented to person, place, and time.     Cranial Nerves: No cranial nerve deficit.     Motor: No abnormal muscle tone.     Coordination: Coordination normal.     Deep Tendon Reflexes: Reflexes are normal and symmetric. Reflexes normal.  Psychiatric:        Behavior: Behavior normal.        Thought Content: Thought content normal.        Judgment: Judgment normal.           Assessment & Plan:   Encounter Diagnoses  Name Primary?  . Carpal tunnel syndrome on left   . Left wrist pain Yes   She would like to have surgery.  I will have Dr. Aline Brochure see her and discuss the procedure.  She is agreeable to this.  Electronically [place], MD 12/18/20192:13 PM

## 2018-08-18 ENCOUNTER — Telehealth: Payer: Self-pay | Admitting: Radiology

## 2018-08-18 NOTE — Telephone Encounter (Signed)
[name] has indicated she will have [facility] Medicare, which does not require a prior auth.

## 2018-08-18 NOTE — Telephone Encounter (Signed)
Patients [employer] medicare is no longer active after 08/30/18/ I called her to see what her coverage will be, so I can get a prior authorization, if needed for her carpal tunnel release.

## 2018-08-22 ENCOUNTER — Telehealth: Payer: Self-pay | Admitting: Radiology

## 2018-08-22 NOTE — Telephone Encounter (Signed)
Called patient to see if she wants to RS to Jan 16th. Left message.

## 2018-08-22 NOTE — Telephone Encounter (Signed)
-----   Message from Josue Hector sent at 08/22/2018  8:37 AM EST ----- I spoke with patient this morning and she states she has decided not to have this surgery right now.  She states she tried to call the office Friday and will call you back today.  [place] still [place].  Please let me know if I should completely cancel or are we going to reschedule.  Thanks,

## 2018-09-01 ENCOUNTER — Ambulatory Visit (HOSPITAL_COMMUNITY): Admission: RE | Admit: 2018-09-01 | Payer: Medicare HMO | Source: Home / Self Care | Admitting: Orthopedic Surgery

## 2018-09-01 ENCOUNTER — Encounter (HOSPITAL_COMMUNITY): Admission: RE | Payer: Self-pay | Source: Home / Self Care

## 2018-09-01 SURGERY — CARPAL TUNNEL RELEASE
Anesthesia: Choice | Laterality: Left

## 2018-09-05 DIAGNOSIS — M25561 Pain in right knee: Secondary | ICD-10-CM | POA: Diagnosis not present

## 2018-09-05 DIAGNOSIS — Z0189 Encounter for other specified special examinations: Secondary | ICD-10-CM | POA: Diagnosis not present

## 2018-09-14 DIAGNOSIS — M25561 Pain in right knee: Secondary | ICD-10-CM | POA: Diagnosis not present

## 2018-09-19 ENCOUNTER — Ambulatory Visit: Payer: Medicare HMO | Admitting: Orthopedic Surgery

## 2018-09-28 DIAGNOSIS — R6889 Other general symptoms and signs: Secondary | ICD-10-CM | POA: Diagnosis not present

## 2018-10-19 ENCOUNTER — Other Ambulatory Visit (HOSPITAL_COMMUNITY): Payer: Self-pay | Admitting: Internal Medicine

## 2018-10-19 DIAGNOSIS — Z853 Personal history of malignant neoplasm of breast: Secondary | ICD-10-CM

## 2018-11-01 ENCOUNTER — Ambulatory Visit (HOSPITAL_COMMUNITY): Payer: Medicare Other

## 2018-11-01 ENCOUNTER — Ambulatory Visit (HOSPITAL_COMMUNITY)
Admission: RE | Admit: 2018-11-01 | Discharge: 2018-11-01 | Disposition: A | Discharge status: Home / Self Care | Payer: Medicare Other | Source: Ambulatory Visit | Attending: Internal Medicine | Admitting: Internal Medicine

## 2018-11-01 ENCOUNTER — Inpatient Hospital Stay (HOSPITAL_COMMUNITY): Payer: Medicare Other | Attending: Internal Medicine

## 2018-11-01 ENCOUNTER — Other Ambulatory Visit: Payer: Self-pay

## 2018-11-01 ENCOUNTER — Encounter (HOSPITAL_COMMUNITY): Payer: Medicare HMO

## 2018-11-01 DIAGNOSIS — Z853 Personal history of malignant neoplasm of breast: Secondary | ICD-10-CM | POA: Diagnosis not present

## 2018-11-01 DIAGNOSIS — Z170 Estrogen receptor positive status [ER+]: Secondary | ICD-10-CM | POA: Diagnosis not present

## 2018-11-01 DIAGNOSIS — C50211 Malignant neoplasm of upper-inner quadrant of right female breast: Secondary | ICD-10-CM

## 2018-11-01 DIAGNOSIS — M1711 Unilateral primary osteoarthritis, right knee: Secondary | ICD-10-CM | POA: Diagnosis not present

## 2018-11-01 DIAGNOSIS — R928 Other abnormal and inconclusive findings on diagnostic imaging of breast: Secondary | ICD-10-CM | POA: Diagnosis not present

## 2018-11-01 DIAGNOSIS — G5601 Carpal tunnel syndrome, right upper limb: Secondary | ICD-10-CM | POA: Diagnosis not present

## 2018-11-01 LAB — CBC WITH DIFFERENTIAL/PLATELET
Abs Immature Granulocytes: 0.02 10*3/uL (ref 0.00–0.07)
Basophils Absolute: 0 10*3/uL (ref 0.0–0.1)
Basophils Relative: 0 %
Eosinophils Absolute: 0.1 10*3/uL (ref 0.0–0.5)
Eosinophils Relative: 1 %
HCT: 37.9 % (ref 36.0–46.0)
Hemoglobin: 11.3 g/dL — ABNORMAL LOW (ref 12.0–15.0)
Immature Granulocytes: 0 %
Lymphocytes Relative: 20 %
Lymphs Abs: 1.1 10*3/uL (ref 0.7–4.0)
MCH: 27.5 pg (ref 26.0–34.0)
MCHC: 29.8 g/dL — ABNORMAL LOW (ref 30.0–36.0)
MCV: 92.2 fL (ref 80.0–100.0)
Monocytes Absolute: 0.5 10*3/uL (ref 0.1–1.0)
Monocytes Relative: 9 %
Neutro Abs: 4 10*3/uL (ref 1.7–7.7)
Neutrophils Relative %: 70 %
Platelets: 259 10*3/uL (ref 150–400)
RBC: 4.11 MIL/uL (ref 3.87–5.11)
RDW: 12.7 % (ref 11.5–15.5)
WBC: 5.7 10*3/uL (ref 4.0–10.5)
nRBC: 0 % (ref 0.0–0.2)

## 2018-11-08 ENCOUNTER — Ambulatory Visit (HOSPITAL_COMMUNITY): Payer: Medicare Other | Admitting: Nurse Practitioner

## 2018-11-08 ENCOUNTER — Ambulatory Visit (HOSPITAL_COMMUNITY): Payer: Medicare HMO | Admitting: Nurse Practitioner

## 2018-11-29 ENCOUNTER — Other Ambulatory Visit: Payer: Self-pay

## 2018-11-30 ENCOUNTER — Inpatient Hospital Stay (HOSPITAL_COMMUNITY): Payer: Medicare Other | Attending: Nurse Practitioner | Admitting: Nurse Practitioner

## 2018-11-30 NOTE — Progress Notes (Unsigned)
[name] was scheduled for a telephone appointment today.  We attempted to call her 4 different times with no answer.  We will reschedule her for next week.

## 2018-12-05 ENCOUNTER — Inpatient Hospital Stay (HOSPITAL_COMMUNITY): Payer: Medicare Other | Admitting: Nurse Practitioner

## 2018-12-05 ENCOUNTER — Telehealth (HOSPITAL_COMMUNITY): Payer: Self-pay | Admitting: *Deleted

## 2018-12-05 NOTE — Telephone Encounter (Signed)
Tried to call patient for over the phone office visit. Pt did not answer left a voicemail for patient to call back to office.

## 2019-03-21 DIAGNOSIS — Z0000 Encounter for general adult medical examination without abnormal findings: Secondary | ICD-10-CM | POA: Diagnosis not present

## 2019-04-27 DIAGNOSIS — M1990 Unspecified osteoarthritis, unspecified site: Secondary | ICD-10-CM | POA: Diagnosis not present

## 2019-08-15 DIAGNOSIS — M25551 Pain in right hip: Secondary | ICD-10-CM | POA: Diagnosis not present

## 2019-08-15 DIAGNOSIS — M79604 Pain in right leg: Secondary | ICD-10-CM | POA: Diagnosis not present

## 2019-08-23 DIAGNOSIS — R6889 Other general symptoms and signs: Secondary | ICD-10-CM | POA: Diagnosis not present

## 2019-08-23 DIAGNOSIS — M25551 Pain in right hip: Secondary | ICD-10-CM | POA: Diagnosis not present

## 2019-08-23 DIAGNOSIS — M545 Low back pain: Secondary | ICD-10-CM | POA: Diagnosis not present

## 2019-10-04 ENCOUNTER — Other Ambulatory Visit (HOSPITAL_COMMUNITY): Payer: Self-pay | Admitting: Nurse Practitioner

## 2019-10-04 DIAGNOSIS — Z853 Personal history of malignant neoplasm of breast: Secondary | ICD-10-CM

## 2019-10-12 ENCOUNTER — Other Ambulatory Visit (HOSPITAL_COMMUNITY): Payer: Self-pay | Admitting: *Deleted

## 2019-10-12 DIAGNOSIS — C50211 Malignant neoplasm of upper-inner quadrant of right female breast: Secondary | ICD-10-CM

## 2019-10-12 DIAGNOSIS — Z170 Estrogen receptor positive status [ER+]: Secondary | ICD-10-CM

## 2019-10-13 ENCOUNTER — Other Ambulatory Visit: Payer: Self-pay

## 2019-10-13 ENCOUNTER — Inpatient Hospital Stay (HOSPITAL_COMMUNITY): Payer: Medicare Other | Attending: Hematology

## 2019-10-13 DIAGNOSIS — Z170 Estrogen receptor positive status [ER+]: Secondary | ICD-10-CM | POA: Diagnosis not present

## 2019-10-13 DIAGNOSIS — Z8249 Family history of ischemic heart disease and other diseases of the circulatory system: Secondary | ICD-10-CM | POA: Insufficient documentation

## 2019-10-13 DIAGNOSIS — J984 Other disorders of lung: Secondary | ICD-10-CM | POA: Diagnosis not present

## 2019-10-13 DIAGNOSIS — M7989 Other specified soft tissue disorders: Secondary | ICD-10-CM | POA: Insufficient documentation

## 2019-10-13 DIAGNOSIS — Z833 Family history of diabetes mellitus: Secondary | ICD-10-CM | POA: Diagnosis not present

## 2019-10-13 DIAGNOSIS — Z79899 Other long term (current) drug therapy: Secondary | ICD-10-CM | POA: Insufficient documentation

## 2019-10-13 DIAGNOSIS — C50211 Malignant neoplasm of upper-inner quadrant of right female breast: Secondary | ICD-10-CM | POA: Insufficient documentation

## 2019-10-13 DIAGNOSIS — R200 Anesthesia of skin: Secondary | ICD-10-CM | POA: Insufficient documentation

## 2019-10-13 LAB — CBC WITH DIFFERENTIAL/PLATELET
Abs Immature Granulocytes: 0.01 10*3/uL (ref 0.00–0.07)
Basophils Absolute: 0 10*3/uL (ref 0.0–0.1)
Basophils Relative: 1 %
Eosinophils Absolute: 0.1 10*3/uL (ref 0.0–0.5)
Eosinophils Relative: 2 %
HCT: 39.4 % (ref 36.0–46.0)
Hemoglobin: 12 g/dL (ref 12.0–15.0)
Immature Granulocytes: 0 %
Lymphocytes Relative: 24 %
Lymphs Abs: 1.2 10*3/uL (ref 0.7–4.0)
MCH: 28.5 pg (ref 26.0–34.0)
MCHC: 30.5 g/dL (ref 30.0–36.0)
MCV: 93.6 fL (ref 80.0–100.0)
Monocytes Absolute: 0.4 10*3/uL (ref 0.1–1.0)
Monocytes Relative: 9 %
Neutro Abs: 3.2 10*3/uL (ref 1.7–7.7)
Neutrophils Relative %: 64 %
Platelets: 252 10*3/uL (ref 150–400)
RBC: 4.21 MIL/uL (ref 3.87–5.11)
RDW: 12.9 % (ref 11.5–15.5)
WBC: 5 10*3/uL (ref 4.0–10.5)
nRBC: 0 % (ref 0.0–0.2)

## 2019-10-13 LAB — COMPREHENSIVE METABOLIC PANEL
ALT: 22 U/L (ref 0–44)
AST: 26 U/L (ref 15–41)
Albumin: 3.7 g/dL (ref 3.5–5.0)
Alkaline Phosphatase: 79 U/L (ref 38–126)
Anion gap: 9 (ref 5–15)
BUN: 15 mg/dL (ref 8–23)
CO2: 28 mmol/L (ref 22–32)
Calcium: 9.7 mg/dL (ref 8.9–10.3)
Chloride: 104 mmol/L (ref 98–111)
Creatinine, Ser: 1.23 mg/dL — ABNORMAL HIGH (ref 0.44–1.00)
GFR calc Af Amer: 48 mL/min — ABNORMAL LOW (ref >60)
GFR calc non Af Amer: 42 mL/min — ABNORMAL LOW (ref >60)
Glucose, Bld: 183 mg/dL — ABNORMAL HIGH (ref 70–99)
Potassium: 4.8 mmol/L (ref 3.5–5.1)
Sodium: 141 mmol/L (ref 135–145)
Total Bilirubin: 0.9 mg/dL (ref 0.3–1.2)
Total Protein: 7.3 g/dL (ref 6.5–8.1)

## 2019-10-13 LAB — LACTATE DEHYDROGENASE: LDH: 144 U/L (ref 98–192)

## 2019-10-18 ENCOUNTER — Inpatient Hospital Stay (HOSPITAL_COMMUNITY): Payer: Medicare Other | Admitting: Nurse Practitioner

## 2019-10-18 ENCOUNTER — Other Ambulatory Visit (HOSPITAL_COMMUNITY): Payer: Self-pay | Admitting: Nurse Practitioner

## 2019-10-18 ENCOUNTER — Other Ambulatory Visit: Payer: Self-pay

## 2019-10-18 ENCOUNTER — Encounter (HOSPITAL_COMMUNITY): Payer: Self-pay | Admitting: Nurse Practitioner

## 2019-10-18 VITALS — BP 162/89 | HR 88 | Temp 97.1°F | Resp 16 | Wt 185.8 lb

## 2019-10-18 DIAGNOSIS — M7989 Other specified soft tissue disorders: Secondary | ICD-10-CM | POA: Diagnosis not present

## 2019-10-18 DIAGNOSIS — Z13820 Encounter for screening for osteoporosis: Secondary | ICD-10-CM | POA: Diagnosis not present

## 2019-10-18 DIAGNOSIS — Z833 Family history of diabetes mellitus: Secondary | ICD-10-CM | POA: Diagnosis not present

## 2019-10-18 DIAGNOSIS — J984 Other disorders of lung: Secondary | ICD-10-CM | POA: Diagnosis not present

## 2019-10-18 DIAGNOSIS — C50211 Malignant neoplasm of upper-inner quadrant of right female breast: Secondary | ICD-10-CM

## 2019-10-18 DIAGNOSIS — Z170 Estrogen receptor positive status [ER+]: Secondary | ICD-10-CM | POA: Diagnosis not present

## 2019-10-18 DIAGNOSIS — R200 Anesthesia of skin: Secondary | ICD-10-CM | POA: Diagnosis not present

## 2019-10-18 DIAGNOSIS — Z8249 Family history of ischemic heart disease and other diseases of the circulatory system: Secondary | ICD-10-CM | POA: Diagnosis not present

## 2019-10-18 DIAGNOSIS — Z79899 Other long term (current) drug therapy: Secondary | ICD-10-CM | POA: Diagnosis not present

## 2019-10-18 MED ORDER — ANASTROZOLE 1 MG PO TABS: 1.0000 mg | ORAL_TABLET | Freq: Every day | ORAL | 6 refills | Status: DC

## 2019-10-18 NOTE — Progress Notes (Signed)
Pt is taking Arimidex as prescribed with no side effects. 

## 2019-10-18 NOTE — Progress Notes (Signed)
[place], [place] [address]   CLINIC:  Medical Oncology/Hematology  PCP:  Celene Squibb, MD [place] [address] [phone]   REASON FOR VISIT: Follow-up for breast cancer  CURRENT THERAPY: Arimidex daily   CANCER STAGING: Cancer Staging Malignant neoplasm of upper-inner quadrant of right female breast [facility]) Staging form: Breast, AJCC 8th Edition - Clinical: Stage IA (cT1b, cN0(sn), cM0, G1, ER: Positive, PR: Positive, HER2: Negative) - Signed by Twana First, MD on 12/07/2016    INTERVAL HISTORY:  Ms. Schwegel 79 y.o. female returns for routine follow-up for breast cancer.  Patient reports she has been doing well since her last visit.  She continues to take Arimidex daily with no side effects.  She is tolerating the medication very well.  She denies any new lumps or bumps present.  She denies any new easy bruising or bleeding. Denies any nausea, vomiting, or diarrhea. Denies any new pains. Had not noticed any recent bleeding such as epistaxis, hematuria or hematochezia. Denies recent chest pain on exertion, shortness of breath on minimal exertion, pre-syncopal episodes, or palpitations. Denies any numbness or tingling in hands or feet. Denies any recent fevers, infections, or recent hospitalizations. Patient reports appetite at 100% and energy level at 100%.  She is eating well maintaining her weight at this time.     REVIEW OF SYSTEMS:  Review of Systems  Cardiovascular: Positive for leg swelling.  Neurological: Positive for numbness.  All other systems reviewed and are negative.    PAST MEDICAL/SURGICAL HISTORY:  Past Medical History:  Diagnosis Date  . Breast cancer ([place])   . CKD (chronic kidney disease) stage 2, GFR 60-89 ml/min   . Essential hypertension   . Hyperlipidemia   . Type 2 diabetes mellitus (HCC)    diet controlled   Past Surgical History:  Procedure Laterality Date  . ABDOMINAL  HYSTERECTOMY    . BREAST BIOPSY Left    benign  . EYE SURGERY Left    cataract removal  . PARTIAL MASTECTOMY WITH NEEDLE LOCALIZATION AND AXILLARY SENTINEL LYMPH NODE BX Right 10/28/2016   Procedure: PARTIAL MASTECTOMY WITH NEEDLE LOCALIZATION AND AXILLARY SENTINEL LYMPH NODE BX;  Surgeon: Aviva Signs, MD;  Location: [facility];  Service: General;  Laterality: Right;     SOCIAL HISTORY:  Social History   Socioeconomic History  . Marital status: Widowed    Spouse name: Not on file  . Number of children: Not on file  . Years of education: Not on file  . Highest education level: Not on file  Occupational History  . Not on file  Tobacco Use  . Smoking status: Never Smoker  . Smokeless tobacco: Never Used  Substance and Sexual Activity  . Alcohol use: No  . Drug use: No  . Sexual activity: Not Currently    Birth control/protection: Surgical  Other Topics Concern  . Not on file  Social History Narrative  . Not on file   Social Determinants of Health   Financial Resource Strain:   . Difficulty of Paying Living Expenses: Not on file  Food Insecurity:   . Worried About Charity fundraiser in the Last Year: Not on file  . Ran Out of Food in the Last Year: Not on file  Transportation Needs:   . Lack of Transportation (Medical): Not on file  . Lack of Transportation (Non-Medical): Not on file  Physical Activity:   . Days of Exercise per  Week: Not on file  . Minutes of Exercise per Session: Not on file  Stress:   . Feeling of Stress : Not on file  Social Connections:   . Frequency of Communication with Friends and Family: Not on file  . Frequency of Social Gatherings with Friends and Family: Not on file  . Attends Religious Services: Not on file  . Active Member of Clubs or Organizations: Not on file  . Attends Archivist Meetings: Not on file  . Marital Status: Not on file  Intimate Partner Violence:   . Fear of Current or Ex-Partner: Not on file  . Emotionally  Abused: Not on file  . Physically Abused: Not on file  . Sexually Abused: Not on file    FAMILY HISTORY:  Family History  Problem Relation Age of Onset  . Hypertension Mother   . Hypertension Father   . Diabetes Mellitus II Sister   . Diabetes Mellitus II Brother   . Diabetes Mellitus II Brother     CURRENT MEDICATIONS:  Outpatient Encounter Medications as of 10/18/2019  Medication Sig  . Accu-Chek FastClix Lancets MISC USE TO CHECK BLOOD SUGAR QID  . anastrozole (ARIMIDEX) 1 MG tablet Take 1 tablet (1 mg total) by mouth daily.  Marland Kitchen aspirin EC 81 MG tablet Take 1 tablet (81 mg total) by mouth daily.  Floria Raveling, Vaccinium myrtillus, (BILBERRY PO) Take 1,000 mg by mouth daily as needed (for eye health.).  Marland Kitchen calcium-vitamin D (OSCAL WITH D) 500-200 MG-UNIT tablet Take 2 tablets by mouth daily with breakfast.  . diltiazem (TIAZAC) 120 MG 24 hr capsule Take 120 mg by mouth daily.  Marland Kitchen loratadine (CLARITIN) 10 MG tablet Take 1 tablet (10 mg total) by mouth daily as needed (congestion/ear pressure).  . Menthol-Camphor (TIGER BALM ARTHRITIS RUB) 11-11 % CREA Apply 1 application topically 3 (three) times daily as needed (for pain (back)).   . [DISCONTINUED] DILT-XR 120 MG 24 hr capsule Take 240 mg by mouth daily.  . [DISCONTINUED] diltiazem (TIAZAC) 240 MG 24 hr capsule Take 240 mg by mouth daily.   No facility-administered encounter medications on file as of 10/18/2019.    ALLERGIES:  No Known Allergies   PHYSICAL EXAM:  ECOG Performance status: 1  Vitals:   10/18/19 1037  BP: (!) 162/89  Pulse: 88  Resp: 16  Temp: (!) 97.1 F (36.2 C)  SpO2: 99%   Filed Weights   10/18/19 1037  Weight: 185 lb 12.8 oz (84.3 kg)    Physical Exam Constitutional:      Appearance: Normal appearance. She is normal weight.  Cardiovascular:     Rate and Rhythm: Normal rate and regular rhythm.     Heart sounds: Normal heart sounds.  Pulmonary:     Effort: Pulmonary effort is normal.     Breath  sounds: Normal breath sounds.  Abdominal:     General: Bowel sounds are normal.     Palpations: Abdomen is soft.  Musculoskeletal:        General: Normal range of motion.  Skin:    General: Skin is warm.  Neurological:     Mental Status: She is alert and oriented to person, place, and time. Mental status is at baseline.  Psychiatric:        Mood and Affect: Mood normal.        Behavior: Behavior normal.        Thought Content: Thought content normal.        Judgment: Judgment  normal.      LABORATORY DATA:  I have reviewed the labs as listed.  CBC    Component Value Date/Time   WBC 5.0 10/13/2019 1020   RBC 4.21 10/13/2019 1020   HGB 12.0 10/13/2019 1020   HCT 39.4 10/13/2019 1020   PLT 252 10/13/2019 1020   MCV 93.6 10/13/2019 1020   MCH 28.5 10/13/2019 1020   MCHC 30.5 10/13/2019 1020   RDW 12.9 10/13/2019 1020   LYMPHSABS 1.2 10/13/2019 1020   MONOABS 0.4 10/13/2019 1020   EOSABS 0.1 10/13/2019 1020   BASOSABS 0.0 10/13/2019 1020   CMP Latest Ref Rng & Units 10/13/2019 06/29/2018 12/20/2017  Glucose 70 - 99 mg/dL 183(H) 160(H) 206(H)  BUN 8 - 23 mg/dL '15 19 17  ' Creatinine 0.44 - 1.00 mg/dL 1.23(H) 1.16(H) 1.21(H)  Sodium 135 - 145 mmol/L 141 140 138  Potassium 3.5 - 5.1 mmol/L 4.8 4.9 4.2  Chloride 98 - 111 mmol/L 104 102 100(L)  CO2 22 - 32 mmol/L '28 27 28  ' Calcium 8.9 - 10.3 mg/dL 9.7 9.8 9.9  Total Protein 6.5 - 8.1 g/dL 7.3 7.9 -  Total Bilirubin 0.3 - 1.2 mg/dL 0.9 0.8 -  Alkaline Phos 38 - 126 U/L 79 88 -  AST 15 - 41 U/L 26 25 -  ALT 0 - 44 U/L 22 21 -    I personally performed a face-to-face visit.  All questions were answered to patient's stated satisfaction. Encouraged patient to call with any new concerns or questions before his next visit to the cancer center and we can certain see him sooner, if needed.     ASSESSMENT & PLAN:   Malignant neoplasm of upper-inner quadrant of right female breast ([place]) 1.  Stage Ia invasive ductal carcinoma of the  right breast: -Patient was diagnosed in 08/2016 after an abnormal screening mammogram with invasive ductal carcinoma ER positive/PR positive/HER-2 negative. -She was treated with a right lumpectomy with SLNB with Dr. Arnoldo Morale on 10/28/2016. -Dr. Eliezer Lofts did not think patient would benefit from adjuvant radiation therapy. -She was started on antiestrogen therapy Arimidex in 11/2016.  Goal is 10 years of therapy.  She is tolerating this very well. -Last mammogram done on 11/01/2018 was B RADS category 2 benign. -Labs done on 10/13/2019 were all normal. -She is due for her yearly mammogram now.  We will set this up on this visit. -She will follow-up in 6 months with repeat labs.  2.  Pulmonary lesions: -Patient had CTA of chest done on 12/20/2017 which showed multiple bilateral partially calcified pleural plaques are consistent with prior asbestos exposure. -She denies a history of smoking. -She was referred to [facility] pulmonary for evaluation and work-up.  3.  Health maintenance: -Last bone density was done on 04/12/2017 which showed a T score of -0.6. -She continues taking calcium and vitamin D daily. -We will order her a DEXA scan on her next visit.      Orders placed this encounter:  Orders Placed This Encounter  Procedures  . DG Bone Density  . Lactate dehydrogenase  . CBC with Differential/Platelet  . Comprehensive metabolic panel  . Vitamin B12  . VITAMIN D 25 Hydroxy (Vit-D Deficiency, Fractures)     Francene Finders, FNP-C [place] [phone]

## 2019-10-18 NOTE — Patient Instructions (Addendum)
[place] at [facility]  Discharge Instructions: Follow up in 6 months with labs and a DEXA scan.   You saw Francene Finders, NP, today. _______________________________________________________________  Thank you for choosing [place] at [facility] to provide your oncology and hematology care.  To afford each patient quality time with our providers, please arrive at least 15 minutes before your scheduled appointment.  You need to re-schedule your appointment if you arrive 10 or more minutes late.  We strive to give you quality time with our providers, and arriving late affects you and other patients whose appointments are after yours.  Also, if you no show three or more times for appointments you may be dismissed from the clinic.  Again, thank you for choosing [place] at [place] hope is that these requests will allow you access to exceptional care and in a timely manner. _______________________________________________________________  If you have questions after your visit, please contact our office at [phone] between the hours of 8:30 a.m. and 5:00 p.m. Voicemails left after 4:30 p.m. will not be returned until the following business day. _______________________________________________________________  For prescription refill requests, have your pharmacy contact our office. _______________________________________________________________  Recommendations made by the consultant and any test results will be sent to your referring physician. _______________________________________________________________

## 2019-10-18 NOTE — Assessment & Plan Note (Addendum)
1.  Stage Ia invasive ductal carcinoma of the right breast: -Patient was diagnosed in 08/2016 after an abnormal screening mammogram with invasive ductal carcinoma ER positive/PR positive/HER-2 negative. -She was treated with a right lumpectomy with SLNB with Dr. Arnoldo Morale on 10/28/2016. -Dr. Eliezer Lofts did not think patient would benefit from adjuvant radiation therapy. -She was started on antiestrogen therapy Arimidex in 11/2016.  Goal is 10 years of therapy.  She is tolerating this very well. -Last mammogram done on 11/01/2018 was B RADS category 2 benign. -Labs done on 10/13/2019 were all normal. -She is due for her yearly mammogram now.  We will set this up on this visit. -She will follow-up in 6 months with repeat labs.  2.  Pulmonary lesions: -Patient had CTA of chest done on 12/20/2017 which showed multiple bilateral partially calcified pleural plaques are consistent with prior asbestos exposure. -She denies a history of smoking. -She was referred to [facility] pulmonary for evaluation and work-up.  3.  Health maintenance: -Last bone density was done on 04/12/2017 which showed a T score of -0.6. -She continues taking calcium and vitamin D daily. -We will order her a DEXA scan on her next visit.

## 2019-10-20 ENCOUNTER — Ambulatory Visit (HOSPITAL_COMMUNITY): Payer: Medicare Other | Admitting: Nurse Practitioner

## 2019-10-25 DIAGNOSIS — I10 Essential (primary) hypertension: Secondary | ICD-10-CM | POA: Diagnosis not present

## 2019-10-25 DIAGNOSIS — M1711 Unilateral primary osteoarthritis, right knee: Secondary | ICD-10-CM | POA: Diagnosis not present

## 2019-11-07 ENCOUNTER — Other Ambulatory Visit: Payer: Self-pay

## 2019-11-07 ENCOUNTER — Ambulatory Visit (HOSPITAL_COMMUNITY)
Admission: RE | Admit: 2019-11-07 | Discharge: 2019-11-07 | Disposition: A | Discharge status: Home / Self Care | Payer: Medicare Other | Source: Ambulatory Visit | Attending: Nurse Practitioner | Admitting: Nurse Practitioner

## 2019-11-07 DIAGNOSIS — Z853 Personal history of malignant neoplasm of breast: Secondary | ICD-10-CM | POA: Diagnosis not present

## 2019-11-07 DIAGNOSIS — R928 Other abnormal and inconclusive findings on diagnostic imaging of breast: Secondary | ICD-10-CM | POA: Diagnosis not present

## 2019-11-07 DIAGNOSIS — E119 Type 2 diabetes mellitus without complications: Secondary | ICD-10-CM | POA: Diagnosis not present

## 2019-11-08 DIAGNOSIS — R6889 Other general symptoms and signs: Secondary | ICD-10-CM | POA: Diagnosis not present

## 2019-12-05 ENCOUNTER — Ambulatory Visit
Admission: EM | Admit: 2019-12-05 | Discharge: 2019-12-05 | Disposition: A | Discharge status: Home / Self Care | Payer: Medicare Other | Attending: Emergency Medicine | Admitting: Emergency Medicine

## 2019-12-05 ENCOUNTER — Other Ambulatory Visit: Payer: Self-pay

## 2019-12-05 DIAGNOSIS — L239 Allergic contact dermatitis, unspecified cause: Secondary | ICD-10-CM

## 2019-12-05 MED ORDER — TRIAMCINOLONE ACETONIDE 0.1 % EX CREA: 1.0000 "application " | TOPICAL_CREAM | Freq: Two times a day (BID) | CUTANEOUS | 0 refills | Status: DC

## 2019-12-05 MED ORDER — LORATADINE 10 MG PO TABS: 10.0000 mg | ORAL_TABLET | Freq: Every day | ORAL | 0 refills | Status: DC

## 2019-12-05 NOTE — ED Provider Notes (Signed)
[facility]    CSN: [id number] Arrival date & time: 12/05/19  1307      History   Chief Complaint Chief Complaint  Patient presents with  . Rash    HPI [name] is a 79 y.o. female.   Who presented to the urgent care with a complaint of rash.  She denies changes in soaps, detergents, or anyone with similar symptoms.  Localized rash to the right side of her chest and back.  She describes it as painful/ red/burning.  Has tried OTC ointment without relief.  Her symptoms are made worse with heat.denies similar symptoms in the past.  Denies chills, fever, nausea, vomiting, diarrhea, confusion, chest pain, chest tightness.  The history is provided by the patient. No language interpreter was used.  Rash   Past Medical History:  Diagnosis Date  . Breast cancer ([place])   . CKD (chronic kidney disease) stage 2, GFR 60-89 ml/min   . Essential hypertension   . Hyperlipidemia   . Type 2 diabetes mellitus ([place])    diet controlled    Patient Active Problem List   Diagnosis Date Noted  . Post-menopausal 12/07/2016  . Malignant neoplasm of upper-inner quadrant of right female breast ([place])   . Chest pain, rule out acute myocardial infarction 06/03/2014  . Chest pain 06/03/2014  . Essential hypertension 06/03/2014  . Hyperglycemia 06/03/2014  . Hyponatremia 06/03/2014    Past Surgical History:  Procedure Laterality Date  . ABDOMINAL HYSTERECTOMY    . BREAST BIOPSY Left    benign  . EYE SURGERY Left    cataract removal  . PARTIAL MASTECTOMY WITH NEEDLE LOCALIZATION AND AXILLARY SENTINEL LYMPH NODE BX Right 10/28/2016   Procedure: PARTIAL MASTECTOMY WITH NEEDLE LOCALIZATION AND AXILLARY SENTINEL LYMPH NODE BX;  Surgeon: Aviva Signs, MD;  Location: [facility];  Service: General;  Laterality: Right;    OB History    Gravida  3   Para  3   Term  3   Preterm      AB      Living        SAB      TAB      Ectopic      Multiple      Live Births              Home Medications    Prior to Admission medications   Medication Sig Start Date End Date Taking? Authorizing Provider  Accu-Chek FastClix Lancets MISC USE TO CHECK BLOOD SUGAR QID 04/28/19   [provider]  anastrozole (ARIMIDEX) 1 MG tablet Take 1 tablet (1 mg total) by mouth daily. 10/18/19   Lockamy, Randi L, NP-C  aspirin EC 81 MG tablet Take 1 tablet (81 mg total) by mouth daily. 06/03/14   Samuella Cota, MD  [place], Vaccinium myrtillus, (BILBERRY PO) Take 1,000 mg by mouth daily as needed (for eye health.).    [provider]  calcium-vitamin D (OSCAL WITH D) 500-200 MG-UNIT tablet Take 2 tablets by mouth daily with breakfast. 12/07/16   Twana First, MD  diltiazem [facility]) 120 MG 24 hr capsule Take 120 mg by mouth daily. 09/07/16   [provider]  loratadine (CLARITIN) 10 MG tablet Take 1 tablet (10 mg total) by mouth daily as needed (congestion/ear pressure). 05/13/18   Virgel Manifold, MD  Menthol-Camphor (TIGER BALM ARTHRITIS RUB) 11-11 % CREA Apply 1 application topically 3 (three) times daily as needed (for pain (back)).     [provider]    Family History Family History  Problem Relation Age of Onset  . Hypertension Mother   . Hypertension Father   . Diabetes Mellitus II Sister   . Diabetes Mellitus II Brother   . Diabetes Mellitus II Brother     Social History Social History   Tobacco Use  . Smoking status: Never Smoker  . Smokeless tobacco: Never Used  Substance Use Topics  . Alcohol use: No  . Drug use: No     Allergies   Patient has no known allergies.   Review of Systems Review of Systems  Constitutional: Negative.   Respiratory: Negative.   Cardiovascular: Negative.   Skin: Positive for color change and rash.  All other systems reviewed and are negative.    Physical Exam Triage Vital Signs ED Triage Vitals [12/05/19 1318]  Enc Vitals Group     BP      Pulse      Resp      Temp      Temp src       SpO2      Weight      Height      Head Circumference      Peak Flow      Pain Score 0     Pain Loc      Pain Edu?      Excl. in [place]?    No data found.  Updated Vital Signs BP (!) 161/93   Pulse 80   Temp 98.8 F (37.1 C)   Resp 18   LMP 12/05/2019   SpO2 96%   Visual Acuity Right Eye Distance:   Left Eye Distance:   Bilateral Distance:    Right Eye Near:   Left Eye Near:    Bilateral Near:     Physical Exam Vitals and nursing note reviewed.  Constitutional:      General: She is not in acute distress.    Appearance: Normal appearance. She is normal weight. She is not ill-appearing, toxic-appearing or diaphoretic.  Cardiovascular:     Rate and Rhythm: Normal rate and regular rhythm.     Pulses: Normal pulses.     Heart sounds: Normal heart sounds. No murmur. No friction rub. No gallop.   Pulmonary:     Effort: Pulmonary effort is normal. No respiratory distress.     Breath sounds: Normal breath sounds. No stridor. No wheezing, rhonchi or rales.  Chest:     Chest wall: No tenderness.  Skin:    General: Skin is warm.     Findings: Rash present. Rash is macular.  Neurological:     Mental Status: She is alert.      UC Treatments / Results  Labs (all labs ordered are listed, but only abnormal results are displayed) Labs Reviewed - No data to display  EKG   Radiology No results found.  Procedures Procedures (including critical care time)  Medications Ordered in UC Medications - No data to display  Initial Impression / Assessment and Plan / UC Course  I have reviewed the triage vital signs and the nursing notes.  Pertinent labs & imaging results that were available during my care of the patient were reviewed by me and considered in my medical decision making (see chart for details).    Patient stable for discharge.  Claritin, and triamcinolone cream were prescribed.  She was advised to follow with PCP.  To return for worsening of  symptoms  Final Clinical Impressions(s) /  UC Diagnoses   Final diagnoses:  Allergic contact dermatitis, unspecified trigger     Discharge Instructions     Prescribed claritin and triamcinolone cream Take as prescribed and to completion Limit hot shower and baths, or bathe with warm water.   Moisturize skin daily Follow up with PCP if symptoms persists Return or go to the ER if you have any new or worsening symptoms     ED Prescriptions    None     PDMP not reviewed this encounter.   Emerson Monte, [place] 12/05/19 1352

## 2019-12-05 NOTE — ED Triage Notes (Signed)
Pt has rash on right chest and back that began on Sunday

## 2019-12-05 NOTE — Discharge Instructions (Addendum)
Prescribed claritin and triamcinolone cream Take as prescribed and to completion Limit hot shower and baths, or bathe with warm water.   Moisturize skin daily Follow up with PCP if symptoms persists Return or go to the ER if you have any new or worsening symptoms

## 2019-12-06 DIAGNOSIS — B027 Disseminated zoster: Secondary | ICD-10-CM | POA: Diagnosis not present

## 2020-01-16 DIAGNOSIS — I209 Angina pectoris, unspecified: Secondary | ICD-10-CM | POA: Diagnosis not present

## 2020-02-23 ENCOUNTER — Ambulatory Visit: Payer: Medicare Other | Admitting: Cardiology

## 2020-03-06 DIAGNOSIS — G5602 Carpal tunnel syndrome, left upper limb: Secondary | ICD-10-CM | POA: Diagnosis not present

## 2020-03-06 DIAGNOSIS — M1711 Unilateral primary osteoarthritis, right knee: Secondary | ICD-10-CM | POA: Diagnosis not present

## 2020-03-06 DIAGNOSIS — R2242 Localized swelling, mass and lump, left lower limb: Secondary | ICD-10-CM | POA: Diagnosis not present

## 2020-03-28 DIAGNOSIS — M79642 Pain in left hand: Secondary | ICD-10-CM | POA: Diagnosis not present

## 2020-03-28 DIAGNOSIS — R6889 Other general symptoms and signs: Secondary | ICD-10-CM | POA: Diagnosis not present

## 2020-03-28 DIAGNOSIS — M25561 Pain in right knee: Secondary | ICD-10-CM | POA: Diagnosis not present

## 2020-04-08 DIAGNOSIS — R011 Cardiac murmur, unspecified: Secondary | ICD-10-CM | POA: Diagnosis not present

## 2020-04-08 DIAGNOSIS — I499 Cardiac arrhythmia, unspecified: Secondary | ICD-10-CM | POA: Diagnosis not present

## 2020-04-08 DIAGNOSIS — G5602 Carpal tunnel syndrome, left upper limb: Secondary | ICD-10-CM | POA: Diagnosis not present

## 2020-04-08 DIAGNOSIS — R6889 Other general symptoms and signs: Secondary | ICD-10-CM | POA: Diagnosis not present

## 2020-04-08 DIAGNOSIS — I10 Essential (primary) hypertension: Secondary | ICD-10-CM | POA: Diagnosis not present

## 2020-04-09 DIAGNOSIS — M25532 Pain in left wrist: Secondary | ICD-10-CM | POA: Diagnosis not present

## 2020-04-09 DIAGNOSIS — G5602 Carpal tunnel syndrome, left upper limb: Secondary | ICD-10-CM | POA: Diagnosis not present

## 2020-04-09 DIAGNOSIS — R6889 Other general symptoms and signs: Secondary | ICD-10-CM | POA: Diagnosis not present

## 2020-04-12 DIAGNOSIS — I499 Cardiac arrhythmia, unspecified: Secondary | ICD-10-CM | POA: Diagnosis not present

## 2020-04-12 DIAGNOSIS — I10 Essential (primary) hypertension: Secondary | ICD-10-CM | POA: Diagnosis not present

## 2020-04-17 ENCOUNTER — Other Ambulatory Visit: Payer: Self-pay

## 2020-04-17 ENCOUNTER — Ambulatory Visit (HOSPITAL_COMMUNITY)
Admission: RE | Admit: 2020-04-17 | Discharge: 2020-04-17 | Disposition: A | Discharge status: Home / Self Care | Payer: Medicare Other | Source: Ambulatory Visit | Attending: Nurse Practitioner | Admitting: Nurse Practitioner

## 2020-04-17 ENCOUNTER — Inpatient Hospital Stay (HOSPITAL_COMMUNITY): Payer: Medicare Other | Attending: Hematology

## 2020-04-17 DIAGNOSIS — C50211 Malignant neoplasm of upper-inner quadrant of right female breast: Secondary | ICD-10-CM | POA: Diagnosis not present

## 2020-04-17 DIAGNOSIS — Z780 Asymptomatic menopausal state: Secondary | ICD-10-CM | POA: Insufficient documentation

## 2020-04-17 DIAGNOSIS — Z79899 Other long term (current) drug therapy: Secondary | ICD-10-CM | POA: Diagnosis not present

## 2020-04-17 DIAGNOSIS — Z13820 Encounter for screening for osteoporosis: Secondary | ICD-10-CM | POA: Insufficient documentation

## 2020-04-17 DIAGNOSIS — Z794 Long term (current) use of insulin: Secondary | ICD-10-CM | POA: Insufficient documentation

## 2020-04-17 DIAGNOSIS — E119 Type 2 diabetes mellitus without complications: Secondary | ICD-10-CM | POA: Insufficient documentation

## 2020-04-17 DIAGNOSIS — Z833 Family history of diabetes mellitus: Secondary | ICD-10-CM | POA: Diagnosis not present

## 2020-04-17 DIAGNOSIS — Z170 Estrogen receptor positive status [ER+]: Secondary | ICD-10-CM | POA: Diagnosis not present

## 2020-04-17 DIAGNOSIS — J984 Other disorders of lung: Secondary | ICD-10-CM | POA: Diagnosis not present

## 2020-04-17 DIAGNOSIS — Z8249 Family history of ischemic heart disease and other diseases of the circulatory system: Secondary | ICD-10-CM | POA: Insufficient documentation

## 2020-04-17 DIAGNOSIS — G479 Sleep disorder, unspecified: Secondary | ICD-10-CM | POA: Diagnosis not present

## 2020-04-17 DIAGNOSIS — Z853 Personal history of malignant neoplasm of breast: Secondary | ICD-10-CM | POA: Diagnosis not present

## 2020-04-17 LAB — CBC WITH DIFFERENTIAL/PLATELET
Abs Immature Granulocytes: 0.02 10*3/uL (ref 0.00–0.07)
Basophils Absolute: 0 10*3/uL (ref 0.0–0.1)
Basophils Relative: 1 %
Eosinophils Absolute: 0.1 10*3/uL (ref 0.0–0.5)
Eosinophils Relative: 2 %
HCT: 41.1 % (ref 36.0–46.0)
Hemoglobin: 12.6 g/dL (ref 12.0–15.0)
Immature Granulocytes: 0 %
Lymphocytes Relative: 24 %
Lymphs Abs: 1.3 10*3/uL (ref 0.7–4.0)
MCH: 28.6 pg (ref 26.0–34.0)
MCHC: 30.7 g/dL (ref 30.0–36.0)
MCV: 93.4 fL (ref 80.0–100.0)
Monocytes Absolute: 0.6 10*3/uL (ref 0.1–1.0)
Monocytes Relative: 11 %
Neutro Abs: 3.4 10*3/uL (ref 1.7–7.7)
Neutrophils Relative %: 62 %
Platelets: 272 10*3/uL (ref 150–400)
RBC: 4.4 MIL/uL (ref 3.87–5.11)
RDW: 12.6 % (ref 11.5–15.5)
WBC: 5.5 10*3/uL (ref 4.0–10.5)
nRBC: 0 % (ref 0.0–0.2)

## 2020-04-17 LAB — COMPREHENSIVE METABOLIC PANEL
ALT: 19 U/L (ref 0–44)
AST: 26 U/L (ref 15–41)
Albumin: 3.9 g/dL (ref 3.5–5.0)
Alkaline Phosphatase: 83 U/L (ref 38–126)
Anion gap: 9 (ref 5–15)
BUN: 21 mg/dL (ref 8–23)
CO2: 26 mmol/L (ref 22–32)
Calcium: 9.6 mg/dL (ref 8.9–10.3)
Chloride: 102 mmol/L (ref 98–111)
Creatinine, Ser: 1.16 mg/dL — ABNORMAL HIGH (ref 0.44–1.00)
GFR calc Af Amer: 51 mL/min — ABNORMAL LOW (ref >60)
GFR calc non Af Amer: 44 mL/min — ABNORMAL LOW (ref >60)
Glucose, Bld: 132 mg/dL — ABNORMAL HIGH (ref 70–99)
Potassium: 4.5 mmol/L (ref 3.5–5.1)
Sodium: 137 mmol/L (ref 135–145)
Total Bilirubin: 0.5 mg/dL (ref 0.3–1.2)
Total Protein: 7.9 g/dL (ref 6.5–8.1)

## 2020-04-17 LAB — LACTATE DEHYDROGENASE: LDH: 157 U/L (ref 98–192)

## 2020-04-17 LAB — VITAMIN D 25 HYDROXY (VIT D DEFICIENCY, FRACTURES): Vit D, 25-Hydroxy: 47.07 ng/mL (ref 30–100)

## 2020-04-17 LAB — VITAMIN B12: Vitamin B-12: 380 pg/mL (ref 180–914)

## 2020-04-19 ENCOUNTER — Other Ambulatory Visit: Payer: Self-pay

## 2020-04-19 ENCOUNTER — Inpatient Hospital Stay (HOSPITAL_COMMUNITY): Payer: Medicare Other | Admitting: Nurse Practitioner

## 2020-04-19 VITALS — BP 184/84 | HR 96 | Temp 97.2°F | Resp 18 | Wt 179.8 lb

## 2020-04-19 DIAGNOSIS — Z833 Family history of diabetes mellitus: Secondary | ICD-10-CM | POA: Diagnosis not present

## 2020-04-19 DIAGNOSIS — C50211 Malignant neoplasm of upper-inner quadrant of right female breast: Secondary | ICD-10-CM

## 2020-04-19 DIAGNOSIS — G479 Sleep disorder, unspecified: Secondary | ICD-10-CM | POA: Diagnosis not present

## 2020-04-19 DIAGNOSIS — Z79899 Other long term (current) drug therapy: Secondary | ICD-10-CM | POA: Diagnosis not present

## 2020-04-19 DIAGNOSIS — Z8249 Family history of ischemic heart disease and other diseases of the circulatory system: Secondary | ICD-10-CM | POA: Diagnosis not present

## 2020-04-19 DIAGNOSIS — Z170 Estrogen receptor positive status [ER+]: Secondary | ICD-10-CM

## 2020-04-19 DIAGNOSIS — J984 Other disorders of lung: Secondary | ICD-10-CM | POA: Diagnosis not present

## 2020-04-19 NOTE — Assessment & Plan Note (Signed)
1.  Stage Ia invasive ductal carcinoma of the right breast: -Patient was diagnosed in 08/2016 after an abnormal screening mammogram with invasive ductal carcinoma ER positive/PR positive/HER-2 negative. -She was treated with a right lumpectomy with SLNB with Dr. Arnoldo Morale on 10/28/2016. -Dr. Eliezer Lofts did not think patient would benefit from adjuvant radiation therapy. -She was started on antiestrogen therapy Arimidex in 11/2016.  Goal is 10 years of therapy.  She is tolerating this very well. -Last mammogram done on 11/07/2019 was B RADS category 2 benign. -Labs done on 04/17/2020 were all normal. -She will follow-up in 6 months with repeat labs and mammogram.  We will go to yearly visits at this time.  2.  Pulmonary lesions: -Patient had CTA of chest done on 12/20/2017 which showed multiple bilateral partially calcified pleural plaques are consistent with prior asbestos exposure. -She denies a history of smoking. -She was referred to [facility] [place] pulmonary for evaluation and work-up.  3.  Health maintenance: -Last bone density was done on 04/17/2020 which showed a T score of -0.8. -She continues taking calcium and vitamin D daily.

## 2020-04-19 NOTE — Progress Notes (Signed)
[address] [place], [place] [address]   CLINIC:  Medical Oncology/Hematology  PCP:  Celene Squibb, MD [place] [address] [phone]   REASON FOR VISIT: Follow-up for breast cancer  CURRENT THERAPY: Arimidex  CANCER STAGING: Cancer Staging Malignant neoplasm of upper-inner quadrant of right female breast [facility]) Staging form: Breast, AJCC 8th Edition - Clinical: Stage IA (cT1b, cN0(sn), cM0, G1, ER: Positive, PR: Positive, HER2: Negative) - Signed by Twana First, MD on 12/07/2016    INTERVAL HISTORY:  [name] 79 y.o. female returns for routine follow-up for breast cancer.  Patient reports she is doing well since her last visit.  She denies any new lumps or bumps present.  She denies any new bone pain. Denies any nausea, vomiting, or diarrhea. Denies any new pains. Had not noticed any recent bleeding such as epistaxis, hematuria or hematochezia. Denies recent chest pain on exertion, shortness of breath on minimal exertion, pre-syncopal episodes, or palpitations. Denies any numbness or tingling in hands or feet. Denies any recent fevers, infections, or recent hospitalizations. Patient reports appetite at 75% and energy level at 75%.  She is eating well maintain her weight is time.    REVIEW OF SYSTEMS:  Review of Systems  Psychiatric/Behavioral: Positive for sleep disturbance.  All other systems reviewed and are negative.    PAST MEDICAL/SURGICAL HISTORY:  Past Medical History:  Diagnosis Date  . Breast cancer ([place])   . CKD (chronic kidney disease) stage 2, GFR 60-89 ml/min   . Essential hypertension   . Hyperlipidemia   . Type 2 diabetes mellitus ([facility])    diet controlled   Past Surgical History:  Procedure Laterality Date  . ABDOMINAL HYSTERECTOMY    . BREAST BIOPSY Left    benign  . EYE SURGERY Left    cataract removal  . PARTIAL MASTECTOMY WITH NEEDLE LOCALIZATION AND AXILLARY SENTINEL LYMPH NODE BX Right  10/28/2016   Procedure: PARTIAL MASTECTOMY WITH NEEDLE LOCALIZATION AND AXILLARY SENTINEL LYMPH NODE BX;  Surgeon: Aviva Signs, MD;  Location: [facility];  Service: General;  Laterality: Right;     SOCIAL HISTORY:  Social History   Socioeconomic History  . Marital status: Widowed    Spouse name: Not on file  . Number of children: Not on file  . Years of education: Not on file  . Highest education level: Not on file  Occupational History  . Not on file  Tobacco Use  . Smoking status: Never Smoker  . Smokeless tobacco: Never Used  Vaping Use  . Vaping Use: Never used  Substance and Sexual Activity  . Alcohol use: No  . Drug use: No  . Sexual activity: Not Currently    Birth control/protection: Surgical  Other Topics Concern  . Not on file  Social History Narrative  . Not on file   Social Determinants of Health   Financial Resource Strain:   . Difficulty of Paying Living Expenses: Not on file  Food Insecurity:   . Worried About Charity fundraiser in the Last Year: Not on file  . Ran Out of Food in the Last Year: Not on file  Transportation Needs:   . Lack of Transportation (Medical): Not on file  . Lack of Transportation (Non-Medical): Not on file  Physical Activity:   . Days of Exercise per Week: Not on file  . Minutes of Exercise per Session: Not on file  Stress:   . Feeling of Stress :  Not on file  Social Connections:   . Frequency of Communication with Friends and Family: Not on file  . Frequency of Social Gatherings with Friends and Family: Not on file  . Attends Religious Services: Not on file  . Active Member of Clubs or Organizations: Not on file  . Attends Archivist Meetings: Not on file  . Marital Status: Not on file  Intimate Partner Violence:   . Fear of Current or Ex-Partner: Not on file  . Emotionally Abused: Not on file  . Physically Abused: Not on file  . Sexually Abused: Not on file    FAMILY HISTORY:  Family History  Problem  Relation Age of Onset  . Hypertension Mother   . Hypertension Father   . Diabetes Mellitus II Sister   . Diabetes Mellitus II Brother   . Diabetes Mellitus II Brother     CURRENT MEDICATIONS:  Outpatient Encounter Medications as of 04/19/2020  Medication Sig  . Accu-Chek FastClix Lancets MISC USE TO CHECK BLOOD SUGAR QID  . anastrozole (ARIMIDEX) 1 MG tablet Take 1 tablet (1 mg total) by mouth daily.  Marland Kitchen aspirin EC 81 MG tablet Take 1 tablet (81 mg total) by mouth daily.  [name], Vaccinium myrtillus, (BILBERRY PO) Take 1,000 mg by mouth daily as needed (for eye health.).  Marland Kitchen calcium-vitamin D (OSCAL WITH D) 500-200 MG-UNIT tablet Take 2 tablets by mouth daily with breakfast.  . diltiazem (TIAZAC) 120 MG 24 hr capsule Take 120 mg by mouth daily.  . furosemide (LASIX) 20 MG tablet Take 20 mg by mouth daily as needed.  Marland Kitchen HYDROcodone-acetaminophen (NORCO/VICODIN) 5-325 MG tablet Take 1 tablet by mouth every 6 (six) hours as needed.  . loratadine (CLARITIN) 10 MG tablet Take 1 tablet (10 mg total) by mouth daily.  Marland Kitchen losartan (COZAAR) 50 MG tablet Take 50 mg by mouth daily.  . Menthol-Camphor (TIGER BALM ARTHRITIS RUB) 11-11 % CREA Apply 1 application topically 3 (three) times daily as needed (for pain (back)).   Marland Kitchen traMADol (ULTRAM) 50 MG tablet Take 50 mg by mouth every 6 (six) hours.  . triamcinolone cream (KENALOG) 0.1 % Apply 1 application topically 2 (two) times daily.  . valACYclovir (VALTREX) 1000 MG tablet Take 1,000 mg by mouth every 8 (eight) hours.   No facility-administered encounter medications on file as of 04/19/2020.    ALLERGIES:  No Known Allergies   PHYSICAL EXAM:  ECOG Performance status: 1  Vitals:   04/19/20 1050  BP: (!) 184/84  Pulse: 96  Resp: 18  Temp: (!) 97.2 F (36.2 C)  SpO2: 99%   Filed Weights   04/19/20 1050  Weight: 179 lb 12.8 oz (81.6 kg)   Physical Exam Constitutional:      Appearance: Normal appearance. She is normal weight.    Cardiovascular:     Rate and Rhythm: Normal rate and regular rhythm.     Heart sounds: Normal heart sounds.  Pulmonary:     Effort: Pulmonary effort is normal.     Breath sounds: Normal breath sounds.  Abdominal:     General: Bowel sounds are normal.     Palpations: Abdomen is soft.  Musculoskeletal:        General: Normal range of motion.  Skin:    General: Skin is warm.  Neurological:     Mental Status: She is alert and oriented to person, place, and time. Mental status is at baseline.  Psychiatric:        Mood  and Affect: Mood normal.        Behavior: Behavior normal.        Thought Content: Thought content normal.        Judgment: Judgment normal.      LABORATORY DATA:  I have reviewed the labs as listed.  CBC    Component Value Date/Time   WBC 5.5 04/17/2020 1018   RBC 4.40 04/17/2020 1018   HGB 12.6 04/17/2020 1018   HCT 41.1 04/17/2020 1018   PLT 272 04/17/2020 1018   MCV 93.4 04/17/2020 1018   MCH 28.6 04/17/2020 1018   MCHC 30.7 04/17/2020 1018   RDW 12.6 04/17/2020 1018   LYMPHSABS 1.3 04/17/2020 1018   MONOABS 0.6 04/17/2020 1018   EOSABS 0.1 04/17/2020 1018   BASOSABS 0.0 04/17/2020 1018   CMP Latest Ref Rng & Units 04/17/2020 10/13/2019 06/29/2018  Glucose 70 - 99 mg/dL 132(H) 183(H) 160(H)  BUN 8 - 23 mg/dL _0 Creatinine 0.44 - 1.00 mg/dL 1.16(H) 1.23(H) 1.16(H)  Sodium 135 - 145 mmol/L 137 141 140  Potassium 3.5 - 5.1 mmol/L 4.5 4.8 4.9  Chloride 98 - 111 mmol/L 102 104 102  CO2 22 - 32 mmol/L _1 Calcium 8.9 - 10.3 mg/dL 9.6 9.7 9.8  Total Protein 6.5 - 8.1 g/dL 7.9 7.3 7.9  Total Bilirubin 0.3 - 1.2 mg/dL 0.5 0.9 0.8  Alkaline Phos 38 - 126 U/L 83 79 88  AST 15 - 41 U/L _2 ALT 0 - 44 U/L _3 All questions were answered to patient's stated satisfaction. Encouraged patient to call with any new concerns or questions before his next visit to the cancer center and we can certain see him sooner, if needed.      ASSESSMENT & PLAN:  Malignant neoplasm of upper-inner quadrant of right female breast ([place]) 1.  Stage Ia invasive ductal carcinoma of the right breast: -Patient was diagnosed in 08/2016 after an abnormal screening mammogram with invasive ductal carcinoma ER positive/PR positive/HER-2 negative. -She was treated with a right lumpectomy with SLNB with Dr. Arnoldo Morale on 10/28/2016. -Dr. Eliezer Lofts did not think patient would benefit from adjuvant radiation therapy. -She was started on antiestrogen therapy Arimidex in 11/2016.  Goal is 10 years of therapy.  She is tolerating this very well. -Last mammogram done on 11/07/2019 was B RADS category 2 benign. -Labs done on 04/17/2020 were all normal. -She will follow-up in 6 months with repeat labs and mammogram.  We will go to yearly visits at this time.  2.  Pulmonary lesions: -Patient had CTA of chest done on 12/20/2017 which showed multiple bilateral partially calcified pleural plaques are consistent with prior asbestos exposure. -She denies a history of smoking. -She was referred to [facility] pulmonary for evaluation and work-up.  3.  Health maintenance: -Last bone density was done on 04/17/2020 which showed a T score of -0.8. -She continues taking calcium and vitamin D daily.     Orders placed this encounter:  Orders Placed This Encounter  Procedures  . MM DIAG BREAST TOMO BILATERAL  . Lactate dehydrogenase  . CBC with Differential/Platelet  . Comprehensive metabolic panel  . Vitamin B12  . VITAMIN D 25 Hydroxy (Vit-D Deficiency, Fractures)      Francene Finders, FNP-C [place] [phone]

## 2020-05-03 DIAGNOSIS — G5602 Carpal tunnel syndrome, left upper limb: Secondary | ICD-10-CM | POA: Diagnosis not present

## 2020-05-14 DIAGNOSIS — G5602 Carpal tunnel syndrome, left upper limb: Secondary | ICD-10-CM | POA: Diagnosis not present

## 2020-05-14 DIAGNOSIS — R6889 Other general symptoms and signs: Secondary | ICD-10-CM | POA: Diagnosis not present

## 2020-06-11 DIAGNOSIS — R6889 Other general symptoms and signs: Secondary | ICD-10-CM | POA: Diagnosis not present

## 2020-09-26 DIAGNOSIS — M1990 Unspecified osteoarthritis, unspecified site: Secondary | ICD-10-CM | POA: Diagnosis not present

## 2020-09-26 DIAGNOSIS — M25511 Pain in right shoulder: Secondary | ICD-10-CM | POA: Diagnosis not present

## 2020-10-10 ENCOUNTER — Other Ambulatory Visit (HOSPITAL_COMMUNITY): Payer: Self-pay | Admitting: Hematology

## 2020-10-10 DIAGNOSIS — Z9889 Other specified postprocedural states: Secondary | ICD-10-CM

## 2020-10-10 DIAGNOSIS — C50211 Malignant neoplasm of upper-inner quadrant of right female breast: Secondary | ICD-10-CM

## 2020-10-10 DIAGNOSIS — Z170 Estrogen receptor positive status [ER+]: Secondary | ICD-10-CM

## 2020-11-11 ENCOUNTER — Other Ambulatory Visit (HOSPITAL_COMMUNITY): Payer: Self-pay

## 2020-11-11 DIAGNOSIS — Z170 Estrogen receptor positive status [ER+]: Secondary | ICD-10-CM

## 2020-11-12 ENCOUNTER — Inpatient Hospital Stay (HOSPITAL_COMMUNITY): Payer: Medicare Other | Attending: Hematology

## 2020-11-12 ENCOUNTER — Ambulatory Visit (HOSPITAL_COMMUNITY)
Admission: RE | Admit: 2020-11-12 | Discharge: 2020-11-12 | Disposition: A | Discharge status: Home / Self Care | Payer: Medicare Other | Source: Ambulatory Visit | Attending: Hematology | Admitting: Hematology

## 2020-11-12 ENCOUNTER — Other Ambulatory Visit: Payer: Self-pay

## 2020-11-12 ENCOUNTER — Ambulatory Visit (HOSPITAL_COMMUNITY)
Admission: RE | Admit: 2020-11-12 | Discharge: 2020-11-12 | Disposition: A | Discharge status: Home / Self Care | Payer: Medicare Other | Source: Ambulatory Visit | Attending: Nurse Practitioner | Admitting: Nurse Practitioner

## 2020-11-12 DIAGNOSIS — G479 Sleep disorder, unspecified: Secondary | ICD-10-CM | POA: Insufficient documentation

## 2020-11-12 DIAGNOSIS — R922 Inconclusive mammogram: Secondary | ICD-10-CM | POA: Diagnosis not present

## 2020-11-12 DIAGNOSIS — D649 Anemia, unspecified: Secondary | ICD-10-CM | POA: Insufficient documentation

## 2020-11-12 DIAGNOSIS — Z170 Estrogen receptor positive status [ER+]: Secondary | ICD-10-CM | POA: Diagnosis not present

## 2020-11-12 DIAGNOSIS — Z8249 Family history of ischemic heart disease and other diseases of the circulatory system: Secondary | ICD-10-CM | POA: Diagnosis not present

## 2020-11-12 DIAGNOSIS — R200 Anesthesia of skin: Secondary | ICD-10-CM | POA: Diagnosis not present

## 2020-11-12 DIAGNOSIS — Z9889 Other specified postprocedural states: Secondary | ICD-10-CM | POA: Insufficient documentation

## 2020-11-12 DIAGNOSIS — Z79899 Other long term (current) drug therapy: Secondary | ICD-10-CM | POA: Insufficient documentation

## 2020-11-12 DIAGNOSIS — C50211 Malignant neoplasm of upper-inner quadrant of right female breast: Secondary | ICD-10-CM | POA: Insufficient documentation

## 2020-11-12 DIAGNOSIS — M7989 Other specified soft tissue disorders: Secondary | ICD-10-CM | POA: Insufficient documentation

## 2020-11-12 DIAGNOSIS — Z79811 Long term (current) use of aromatase inhibitors: Secondary | ICD-10-CM | POA: Diagnosis not present

## 2020-11-12 DIAGNOSIS — Z833 Family history of diabetes mellitus: Secondary | ICD-10-CM | POA: Diagnosis not present

## 2020-11-12 DIAGNOSIS — J984 Other disorders of lung: Secondary | ICD-10-CM | POA: Insufficient documentation

## 2020-11-12 LAB — COMPREHENSIVE METABOLIC PANEL
ALT: 16 U/L (ref 0–44)
AST: 25 U/L (ref 15–41)
Albumin: 3.6 g/dL (ref 3.5–5.0)
Alkaline Phosphatase: 65 U/L (ref 38–126)
Anion gap: 11 (ref 5–15)
BUN: 22 mg/dL (ref 8–23)
CO2: 24 mmol/L (ref 22–32)
Calcium: 9.4 mg/dL (ref 8.9–10.3)
Chloride: 104 mmol/L (ref 98–111)
Creatinine, Ser: 1.13 mg/dL — ABNORMAL HIGH (ref 0.44–1.00)
GFR, Estimated: 49 mL/min — ABNORMAL LOW (ref >60)
Glucose, Bld: 177 mg/dL — ABNORMAL HIGH (ref 70–99)
Potassium: 4 mmol/L (ref 3.5–5.1)
Sodium: 139 mmol/L (ref 135–145)
Total Bilirubin: 0.6 mg/dL (ref 0.3–1.2)
Total Protein: 7.1 g/dL (ref 6.5–8.1)

## 2020-11-12 LAB — CBC WITH DIFFERENTIAL/PLATELET
Abs Immature Granulocytes: 0.01 10*3/uL (ref 0.00–0.07)
Basophils Absolute: 0 10*3/uL (ref 0.0–0.1)
Basophils Relative: 1 %
Eosinophils Absolute: 0.1 10*3/uL (ref 0.0–0.5)
Eosinophils Relative: 2 %
HCT: 36.4 % (ref 36.0–46.0)
Hemoglobin: 11.2 g/dL — ABNORMAL LOW (ref 12.0–15.0)
Immature Granulocytes: 0 %
Lymphocytes Relative: 23 %
Lymphs Abs: 1 10*3/uL (ref 0.7–4.0)
MCH: 28.6 pg (ref 26.0–34.0)
MCHC: 30.8 g/dL (ref 30.0–36.0)
MCV: 92.9 fL (ref 80.0–100.0)
Monocytes Absolute: 0.4 10*3/uL (ref 0.1–1.0)
Monocytes Relative: 8 %
Neutro Abs: 2.9 10*3/uL (ref 1.7–7.7)
Neutrophils Relative %: 66 %
Platelets: 263 10*3/uL (ref 150–400)
RBC: 3.92 MIL/uL (ref 3.87–5.11)
RDW: 13.2 % (ref 11.5–15.5)
WBC: 4.5 10*3/uL (ref 4.0–10.5)
nRBC: 0 % (ref 0.0–0.2)

## 2020-11-12 LAB — VITAMIN B12: Vitamin B-12: 258 pg/mL (ref 180–914)

## 2020-11-12 LAB — LACTATE DEHYDROGENASE: LDH: 147 U/L (ref 98–192)

## 2020-11-12 LAB — VITAMIN D 25 HYDROXY (VIT D DEFICIENCY, FRACTURES): Vit D, 25-Hydroxy: 57.94 ng/mL (ref 30–100)

## 2020-11-15 ENCOUNTER — Ambulatory Visit (HOSPITAL_COMMUNITY): Payer: Medicare Other

## 2020-11-19 ENCOUNTER — Ambulatory Visit (HOSPITAL_COMMUNITY): Payer: Medicare Other | Admitting: Physician Assistant

## 2020-11-26 ENCOUNTER — Inpatient Hospital Stay (HOSPITAL_COMMUNITY): Payer: Medicare Other | Admitting: Physician Assistant

## 2020-11-26 ENCOUNTER — Encounter (HOSPITAL_COMMUNITY): Payer: Self-pay | Admitting: Physician Assistant

## 2020-11-26 ENCOUNTER — Other Ambulatory Visit: Payer: Self-pay

## 2020-11-26 VITALS — BP 162/70 | HR 88 | Temp 96.8°F | Resp 18 | Wt 178.1 lb

## 2020-11-26 DIAGNOSIS — J984 Other disorders of lung: Secondary | ICD-10-CM | POA: Diagnosis not present

## 2020-11-26 DIAGNOSIS — D649 Anemia, unspecified: Secondary | ICD-10-CM | POA: Diagnosis not present

## 2020-11-26 DIAGNOSIS — Z79899 Other long term (current) drug therapy: Secondary | ICD-10-CM | POA: Diagnosis not present

## 2020-11-26 DIAGNOSIS — C50211 Malignant neoplasm of upper-inner quadrant of right female breast: Secondary | ICD-10-CM

## 2020-11-26 DIAGNOSIS — Z170 Estrogen receptor positive status [ER+]: Secondary | ICD-10-CM | POA: Diagnosis not present

## 2020-11-26 DIAGNOSIS — Z79811 Long term (current) use of aromatase inhibitors: Secondary | ICD-10-CM | POA: Diagnosis not present

## 2020-11-26 DIAGNOSIS — G479 Sleep disorder, unspecified: Secondary | ICD-10-CM | POA: Diagnosis not present

## 2020-11-26 DIAGNOSIS — R200 Anesthesia of skin: Secondary | ICD-10-CM | POA: Diagnosis not present

## 2020-11-26 DIAGNOSIS — M7989 Other specified soft tissue disorders: Secondary | ICD-10-CM | POA: Diagnosis not present

## 2020-11-26 DIAGNOSIS — Z833 Family history of diabetes mellitus: Secondary | ICD-10-CM | POA: Diagnosis not present

## 2020-11-26 DIAGNOSIS — Z8249 Family history of ischemic heart disease and other diseases of the circulatory system: Secondary | ICD-10-CM | POA: Diagnosis not present

## 2020-11-26 NOTE — Patient Instructions (Signed)
[name] Discharge Instructions  You were seen today by Tarri Abernethy PA-C for your ongoing breast cancer surveillance.    LABS: Return in 1 year for labs.   OTHER TESTS: Mammogram in 1 year (March 2023).  Bone density scan in August 2023.  MEDICATIONS: Continue Arimidex, calcium, and Vitamin D  FOLLOW-UP APPOINTMENT: 1 year   Thank you for choosing [place] at [facility] to provide your oncology and hematology care.  To afford each patient quality time with our provider, please arrive at least 15 minutes before your scheduled appointment time.   If you have a lab appointment with the [facility] please come in thru the Main Entrance and check in at the main information desk.  You need to re-schedule your appointment should you arrive 10 or more minutes late.  We strive to give you quality time with our providers, and arriving late affects you and other patients whose appointments are after yours.  Also, if you no show three or more times for appointments you may be dismissed from the clinic at the providers discretion.     Again, thank you for choosing [facility].  Our hope is that these requests will decrease the amount of time that you wait before being seen by our physicians.       _____________________________________________________________  Should you have questions after your visit to [facility], please contact our office at [phone] and follow the prompts.  Our office hours are 8:00 a.m. and 4:30 p.m. Monday - Friday.  Please note that voicemails left after 4:00 p.m. may not be returned until the following business day.  We are closed weekends and major holidays.  You do have access to a nurse 24-7, just call the main number to the clinic [phone] and do not press any options, hold on the line and a nurse will answer the phone.    For prescription refill requests, have your  pharmacy contact our office and allow 72 hours.    Due to Covid, you will need to wear a mask upon entering the hospital. If you do not have a mask, a mask will be given to you at the Main Entrance upon arrival. For doctor visits, patients may have 1 support person age 59 or older with them. For treatment visits, patients can not have anyone with them due to social distancing guidelines and our immunocompromised population.

## 2020-11-26 NOTE — Progress Notes (Signed)
Pt is taking Anastrazole as prescribed with no side effects.

## 2020-11-26 NOTE — Progress Notes (Signed)
[facility] [place], [place] [address]   CLINIC:  Medical Oncology/Hematology  PCP:  Celene Squibb, MD [place] [address] [phone]   REASON FOR VISIT: Follow-up for breast cancer  PRIOR THERAPY: Right lumpectomy with sentinel lymph node biopsy (10/28/2016)  CURRENT THERAPY: Arimidex started April 2018, goal is 10 years of therapy  CANCER STAGING: Cancer Staging Malignant neoplasm of upper-inner quadrant of right female breast ([facility]) Staging form: Breast, AJCC 8th Edition - Clinical: Stage IA (cT1b, cN0(sn), cM0, G1, ER: Positive, PR: Positive, HER2: Negative) - Signed by Twana First, MD on 12/07/2016   INTERVAL HISTORY:  [name] 80 y.o. female returns for routine follow-up of stage Ia invasive ductal carcinoma of the right breast.  She had a right lumpectomy with SLNB in February 2018, and has been on Arimidex since April 2018.  Of note, she also had a left breast lumpectomy about 25 years ago in [place], which she reports was benign.  She has been doing well since her last visit.  She had carpal tunnel surgery of her left hand, but has not had any other surgeries, illnesses, or hospitalizations since her last visit.  She denies any alarm symptoms, including unexplained weight loss, new bone pain, unexplained abdominal pain or change in bowel habits, new headaches or neurologic symptoms, or shortness of breath and chest pain.  No new lumps or bumps.  No nipple discharge.  She performs breast self-exams at home every 3-4 months, but has not noticed any changes.  No lymphedema of the affected arm. No fever, chills, night sweats.  She has difficulty sleeping, which has been ongoing since her husband died in 21.  She is tolerating Arimidex well; denies hot flashes, nausea/vomiting, constipation, diarrhea, poor appetite, excessive fatigue, chest pain, neurologic symptoms.  She has 100% energy and 75% appetite. She  endorses that she is maintaining a stable weight.  REVIEW OF SYSTEMS:  Review of Systems  Constitutional: Negative for appetite change, chills, diaphoresis, fatigue, fever and unexpected weight change.  HENT:   Negative for lump/mass and nosebleeds.   Eyes: Negative for eye problems.  Respiratory: Negative for cough, hemoptysis and shortness of breath.   Cardiovascular: Positive for leg swelling (Left ankle swelling, ongoing for years). Negative for chest pain and palpitations.  Gastrointestinal: Negative for abdominal pain, blood in stool, constipation, diarrhea, nausea and vomiting.  Genitourinary: Negative for hematuria.   Skin: Negative.   Neurological: Positive for numbness (Ongoing in left hand since carpal tunnel surgery). Negative for dizziness, headaches and light-headedness.  Hematological: Does not bruise/bleed easily.      PAST MEDICAL/SURGICAL HISTORY:  Past Medical History:  Diagnosis Date  . Breast cancer ([facility])   . CKD (chronic kidney disease) stage 2, GFR 60-89 ml/min   . Essential hypertension   . Hyperlipidemia   . Type 2 diabetes mellitus (HCC)    diet controlled   Past Surgical History:  Procedure Laterality Date  . ABDOMINAL HYSTERECTOMY    . BREAST BIOPSY Left    benign  . EYE SURGERY Left    cataract removal  . PARTIAL MASTECTOMY WITH NEEDLE LOCALIZATION AND AXILLARY SENTINEL LYMPH NODE BX Right 10/28/2016   Procedure: PARTIAL MASTECTOMY WITH NEEDLE LOCALIZATION AND AXILLARY SENTINEL LYMPH NODE BX;  Surgeon: Aviva Signs, MD;  Location: [facility];  Service: General;  Laterality: Right;     SOCIAL HISTORY:  Social History   Socioeconomic History  . Marital status: Widowed  Spouse name: Not on file  . Number of children: Not on file  . Years of education: Not on file  . Highest education level: Not on file  Occupational History  . Not on file  Tobacco Use  . Smoking status: Never Smoker  . Smokeless tobacco: Never Used  Vaping Use  . Vaping  Use: Never used  Substance and Sexual Activity  . Alcohol use: No  . Drug use: No  . Sexual activity: Not Currently    Birth control/protection: Surgical  Other Topics Concern  . Not on file  Social History Narrative  . Not on file   Social Determinants of Health   Financial Resource Strain: Not on file  Food Insecurity: Not on file  Transportation Needs: Not on file  Physical Activity: Not on file  Stress: Not on file  Social Connections: Not on file  Intimate Partner Violence: Not on file    FAMILY HISTORY:  Family History  Problem Relation Age of Onset  . Hypertension Mother   . Hypertension Father   . Diabetes Mellitus II Sister   . Diabetes Mellitus II Brother   . Diabetes Mellitus II Brother     CURRENT MEDICATIONS:  Outpatient Encounter Medications as of 11/26/2020  Medication Sig  . Accu-Chek FastClix Lancets MISC USE TO CHECK BLOOD SUGAR QID  . anastrozole (ARIMIDEX) 1 MG tablet Take 1 tablet (1 mg total) by mouth daily.  Marland Kitchen aspirin EC 81 MG tablet Take 1 tablet (81 mg total) by mouth daily.  Floria Raveling, Vaccinium myrtillus, (BILBERRY PO) Take 1,000 mg by mouth daily as needed (for eye health.).  Marland Kitchen calcium-vitamin D (OSCAL WITH D) 500-200 MG-UNIT tablet Take 2 tablets by mouth daily with breakfast.  . diltiazem (TIAZAC) 120 MG 24 hr capsule Take 120 mg by mouth daily.  . furosemide (LASIX) 20 MG tablet Take 20 mg by mouth daily as needed.  Marland Kitchen HYDROcodone-acetaminophen (NORCO/VICODIN) 5-325 MG tablet Take 1 tablet by mouth every 6 (six) hours as needed.  . loratadine (CLARITIN) 10 MG tablet Take 1 tablet (10 mg total) by mouth daily.  Marland Kitchen losartan (COZAAR) 50 MG tablet Take 50 mg by mouth daily.  . Menthol-Camphor (TIGER BALM ARTHRITIS RUB) 11-11 % CREA Apply 1 application topically 3 (three) times daily as needed (for pain (back)).   Marland Kitchen traMADol (ULTRAM) 50 MG tablet Take 50 mg by mouth every 6 (six) hours.  . triamcinolone cream (KENALOG) 0.1 % Apply 1 application  topically 2 (two) times daily.  . valACYclovir (VALTREX) 1000 MG tablet Take 1,000 mg by mouth every 8 (eight) hours.   No facility-administered encounter medications on file as of 11/26/2020.    ALLERGIES:  No Known Allergies   PHYSICAL EXAM:  ECOG PERFORMANCE STATUS: 0 - Asymptomatic  There were no vitals filed for this visit. There were no vitals filed for this visit. Physical Exam Constitutional:      Appearance: Normal appearance.  HENT:     Head: Normocephalic and atraumatic.     Mouth/Throat:     Mouth: Mucous membranes are moist.  Eyes:     Extraocular Movements: Extraocular movements intact.     Pupils: Pupils are equal, round, and reactive to light.  Cardiovascular:     Rate and Rhythm: Normal rate and regular rhythm.     Pulses: Normal pulses.     Heart sounds: Normal heart sounds.  Pulmonary:     Effort: Pulmonary effort is normal.     Breath sounds: Normal  breath sounds.  Chest:  Breasts:     Right: No axillary adenopathy or supraclavicular adenopathy.     Left: No axillary adenopathy or supraclavicular adenopathy.     Abdominal:     General: Bowel sounds are normal.     Palpations: Abdomen is soft.     Tenderness: There is no abdominal tenderness.  Musculoskeletal:        General: No swelling.     Right lower leg: No edema.     Left lower leg: No edema.  Lymphadenopathy:     Cervical: No cervical adenopathy.     Upper Body:     Right upper body: No supraclavicular, axillary or pectoral adenopathy.     Left upper body: No supraclavicular, axillary or pectoral adenopathy.  Skin:    General: Skin is warm and dry.  Neurological:     General: No focal deficit present.     Mental Status: She is alert and oriented to person, place, and time.  Psychiatric:        Mood and Affect: Mood normal.        Behavior: Behavior normal.     LABORATORY DATA:  I have reviewed the labs as listed.  CBC    Component Value Date/Time   WBC 4.5 11/12/2020 1006    RBC 3.92 11/12/2020 1006   HGB 11.2 (L) 11/12/2020 1006   HCT 36.4 11/12/2020 1006   PLT 263 11/12/2020 1006   MCV 92.9 11/12/2020 1006   MCH 28.6 11/12/2020 1006   MCHC 30.8 11/12/2020 1006   RDW 13.2 11/12/2020 1006   LYMPHSABS 1.0 11/12/2020 1006   MONOABS 0.4 11/12/2020 1006   EOSABS 0.1 11/12/2020 1006   BASOSABS 0.0 11/12/2020 1006   CMP Latest Ref Rng & Units 11/12/2020 04/17/2020 10/13/2019  Glucose 70 - 99 mg/dL 177(H) 132(H) 183(H)  BUN 8 - 23 mg/dL '22 21 15  ' Creatinine 0.44 - 1.00 mg/dL 1.13(H) 1.16(H) 1.23(H)  Sodium 135 - 145 mmol/L 139 137 141  Potassium 3.5 - 5.1 mmol/L 4.0 4.5 4.8  Chloride 98 - 111 mmol/L 104 102 104  CO2 22 - 32 mmol/L '24 26 28  ' Calcium 8.9 - 10.3 mg/dL 9.4 9.6 9.7  Total Protein 6.5 - 8.1 g/dL 7.1 7.9 7.3  Total Bilirubin 0.3 - 1.2 mg/dL 0.6 0.5 0.9  Alkaline Phos 38 - 126 U/L 65 83 79  AST 15 - 41 U/L '25 26 26  ' ALT 0 - 44 U/L '16 19 22    ' DIAGNOSTIC IMAGING:  I have independently reviewed the relevant imaging and discussed with the patient.   ASSESSMENT: 1.  Stage Ia invasive ductal carcinoma of the upper-inner quadrant of right breast: -Patient was diagnosed in 08/2016 after an abnormal screening mammogram with invasive ductal carcinoma ER positive/PR positive/HER-2 negative. -She was treated with a right lumpectomy with SLNB with Dr. Arnoldo Morale on 10/28/2016. -Dr. Eliezer Lofts did not think patient would benefit from adjuvant radiation therapy. -She was started on antiestrogen therapy Arimidex in 11/2016.  Goal is 10 years of therapy.  She is tolerating this very well. -Of note, patient also had left lumpectomy in [place] about 25 years ago, which was benign -Last mammogram done on 11/12/2020 was B RADS category 2 benign - no mammographic evidence of malignancy in either breast; LDH was normal -Labs done on 11/12/2020 were all normal, apart from mild anemia with hemoglobin 11.2 and renal function at baseline CKD 3 with creatinine 1.13  2.  Pulmonary  lesions: -Patient had  CTA of chest done on 12/20/2017 which showed multiple bilateral partially calcified pleural plaques are consistent with prior asbestos exposure. -She denies a history of smoking.  -She was referred to [facility] pulmonary for evaluation and work-up.  3.  Health maintenance: -Last bone density was done on 04/17/2020 which showed a T score of -0.8. -She continues taking calcium and vitamin D daily.   PLAN:  1.  Stage Ia invasive ductal carcinoma of the upper-inner quadrant of right breast: -Last mammogram done on 11/12/2020 was B RADS category 2 benign - no mammographic evidence of malignancy in either breast -Labs done on 11/12/2020 were all normal, apart from mild anemia with hemoglobin 11.2 and renal function at baseline CKD 3 with creatinine 1.13 -She remains stable, so we will go to yearly visits at this time.  She will follow-up in 1 year with repeat labs and mammogram.  -Patient is aware of alarm symptoms, and knows to call the clinic if these occur -Continue Arimidex, tolerating well at this time  2.  Pulmonary lesions: -Patient had CTA of chest done on 12/20/2017 which showed multiple bilateral partially calcified pleural plaques are consistent with prior asbestos exposure. -She denies a history of smoking. -She was referred to [facility] pulmonary for evaluation and work-up.  3.  Health maintenance: -Last bone density was done on 04/17/2020 which showed a T score of -0.8. -She will be due for her next bone density scan in August 2023 -She continues taking calcium and vitamin D daily.   PLAN SUMMARY & DISPOSITION: -Repeat labs and mammogram in 1 year with follow-up visit -Next bone density scan August 2023 -Continue Arimidex, calcium, vitamin D  All questions were answered. The patient knows to call the clinic with any problems, questions or concerns.  Medical decision making: Low  Time spent on visit: I spent 25 minutes counseling the patient face to face.  The total time spent in the appointment was 30 minutes and more than 50% was on counseling.   Harriett Rush, PA-C  11/26/20 9:15 AM

## 2020-12-03 ENCOUNTER — Ambulatory Visit (HOSPITAL_COMMUNITY): Payer: Medicare Other | Admitting: Physician Assistant

## 2021-01-13 ENCOUNTER — Other Ambulatory Visit (HOSPITAL_COMMUNITY): Payer: Self-pay | Admitting: *Deleted

## 2021-01-13 DIAGNOSIS — C50211 Malignant neoplasm of upper-inner quadrant of right female breast: Secondary | ICD-10-CM

## 2021-01-13 DIAGNOSIS — H02831 Dermatochalasis of right upper eyelid: Secondary | ICD-10-CM | POA: Diagnosis not present

## 2021-01-13 DIAGNOSIS — Z170 Estrogen receptor positive status [ER+]: Secondary | ICD-10-CM

## 2021-01-13 DIAGNOSIS — H01001 Unspecified blepharitis right upper eyelid: Secondary | ICD-10-CM | POA: Diagnosis not present

## 2021-01-13 DIAGNOSIS — H01005 Unspecified blepharitis left lower eyelid: Secondary | ICD-10-CM | POA: Diagnosis not present

## 2021-01-13 DIAGNOSIS — H02834 Dermatochalasis of left upper eyelid: Secondary | ICD-10-CM | POA: Diagnosis not present

## 2021-01-13 DIAGNOSIS — H25811 Combined forms of age-related cataract, right eye: Secondary | ICD-10-CM | POA: Diagnosis not present

## 2021-01-13 DIAGNOSIS — H01004 Unspecified blepharitis left upper eyelid: Secondary | ICD-10-CM | POA: Diagnosis not present

## 2021-01-13 DIAGNOSIS — E119 Type 2 diabetes mellitus without complications: Secondary | ICD-10-CM | POA: Diagnosis not present

## 2021-01-13 DIAGNOSIS — H01002 Unspecified blepharitis right lower eyelid: Secondary | ICD-10-CM | POA: Diagnosis not present

## 2021-01-13 DIAGNOSIS — H18413 Arcus senilis, bilateral: Secondary | ICD-10-CM | POA: Diagnosis not present

## 2021-01-13 MED ORDER — ANASTROZOLE 1 MG PO TABS: 1.0000 mg | ORAL_TABLET | Freq: Every day | ORAL | 6 refills | Status: DC

## 2021-03-04 NOTE — H&P (Signed)
Surgical History & Physical  Patient Name: [name] DOB: [birth year]  Surgery: Cataract extraction with intraocular lens implant phacoemulsification; Right Eye  Surgeon: Baruch Goldmann MD Surgery Date:  03/14/2021 Pre-Op Date:  03/04/2021  HPI: A 89 Yr. old female patient Pt referred by Dr. Jorja Loa for cataract evaluation. The patient complains of difficulty when viewing TV, reading closed caption, news scrolls on TV, which began 5 years ago. The right eye is affected. The condition's severity is worsening. The complaint is associated with blurry vision. Pt states she struggles in vision in dimmer setting. Making it difficulty to see at near. Symptoms are negatively affecting pt 's quality of life. Pt using Systane prn OU. Pt denies any increase in floaters/flashes of light. HPI Completed by Dr. 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 Diabetes - DM Type 2 High Blood Pressure  Review of Systems Negative Allergic/Immunologic Negative Cardiovascular Negative Constitutional Negative Ear, Nose, Mouth & Throat Negative Endocrine Negative Eyes Negative Gastrointestinal Negative Genitourinary Negative Hemotologic/Lymphatic Negative Integumentary Negative Musculoskeletal Negative Neurological Negative Psychiatry Negative Respiratory  Social   Never smoked   Medication Systane Complete,  Losartan-Hydrochlorothiazide, Anastrozole,   Sx/Procedures Phaco c IOL,  Hand Surgery, Breast Surgery,   Drug Allergies   NKDA  History & Physical: Heent:  Cataract, Right eye NECK: supple without bruits LUNGS: lungs clear to auscultation CV: regular rate and rhythm Abdomen: soft and non-tender  Impression & Plan: Assessment: 1.  COMBINED FORMS AGE RELATED CATARACT; Right Eye (H25.811) 2.  Diabetes Type 2 No retinopathy (E11.9) 3.  BLEPHARITIS; Right Upper Lid, Right Lower Lid, Left Upper Lid, Left  Lower Lid (H01.001, H01.002,H01.004,H01.005) 4.  ARCUS SENILIS; Both Eyes (H18.413) 5.  DERMATOCHALASIS; Right Upper Lid, Left Upper Lid (H02.831, S34.196)  Plan: 1.  Cataract accounts for the patient's decreased vision. This visual impairment is not correctable with a tolerable change in glasses or contact lenses. Cataract surgery with an implantation of a new lens should significantly improve the visual and functional status of the patient. Discussed all risks, benefits, alternatives, and potential complications. Discussed the procedures and recovery. Patient desires to have surgery. A-scan ordered and performed today for intra-ocular lens calculations. The surgery will be performed in order to improve vision for driving, reading, and for eye examinations. Recommend phacoemulsification with intra-ocular lens. Recommend Dextenza for post-operative pain and inflammation. Right Eye. Dilates poorly - shugacaine by protocol. Malyugin Ring in room. Omidira.  2.  Stressed importance of blood sugar and blood pressure control, and also yearly eye examinations. Discussed the need for ongoing proactive ocular exams and treatment, hopefully before visual symptoms develop.  3.  Recommend regular lid cleaning  4.  Discussed significance of finding  5.  Asymptomatic, recommend observation for now. Findings, prognosis and treatment options reviewed.

## 2021-03-07 ENCOUNTER — Other Ambulatory Visit: Payer: Self-pay

## 2021-03-07 ENCOUNTER — Encounter (HOSPITAL_COMMUNITY)
Admission: RE | Admit: 2021-03-07 | Discharge: 2021-03-07 | Disposition: A | Discharge status: Home / Self Care | Payer: Medicare Other | Source: Ambulatory Visit | Attending: Ophthalmology | Admitting: Ophthalmology

## 2021-03-10 DIAGNOSIS — H25811 Combined forms of age-related cataract, right eye: Secondary | ICD-10-CM | POA: Diagnosis not present

## 2021-03-12 ENCOUNTER — Other Ambulatory Visit (HOSPITAL_COMMUNITY): Payer: Medicare Other

## 2021-03-14 ENCOUNTER — Encounter (HOSPITAL_COMMUNITY): Payer: Self-pay | Admitting: Ophthalmology

## 2021-03-14 ENCOUNTER — Ambulatory Visit (HOSPITAL_COMMUNITY): Payer: Medicare Other | Admitting: Certified Registered"

## 2021-03-14 ENCOUNTER — Ambulatory Visit (HOSPITAL_COMMUNITY)
Admission: RE | Admit: 2021-03-14 | Discharge: 2021-03-14 | Disposition: A | Discharge status: Home / Self Care | Payer: Medicare Other | Attending: Ophthalmology | Admitting: Ophthalmology

## 2021-03-14 ENCOUNTER — Other Ambulatory Visit: Payer: Self-pay

## 2021-03-14 ENCOUNTER — Encounter (HOSPITAL_COMMUNITY)
Admission: RE | Disposition: A | Discharge status: Home / Self Care | Payer: Self-pay | Source: Home / Self Care | Attending: Ophthalmology

## 2021-03-14 DIAGNOSIS — H2181 Floppy iris syndrome: Secondary | ICD-10-CM | POA: Diagnosis not present

## 2021-03-14 DIAGNOSIS — H18413 Arcus senilis, bilateral: Secondary | ICD-10-CM | POA: Insufficient documentation

## 2021-03-14 DIAGNOSIS — Z79899 Other long term (current) drug therapy: Secondary | ICD-10-CM | POA: Diagnosis not present

## 2021-03-14 DIAGNOSIS — H25811 Combined forms of age-related cataract, right eye: Secondary | ICD-10-CM | POA: Insufficient documentation

## 2021-03-14 DIAGNOSIS — H0100B Unspecified blepharitis left eye, upper and lower eyelids: Secondary | ICD-10-CM | POA: Insufficient documentation

## 2021-03-14 DIAGNOSIS — H02831 Dermatochalasis of right upper eyelid: Secondary | ICD-10-CM | POA: Insufficient documentation

## 2021-03-14 DIAGNOSIS — E1136 Type 2 diabetes mellitus with diabetic cataract: Secondary | ICD-10-CM | POA: Diagnosis not present

## 2021-03-14 DIAGNOSIS — H0100A Unspecified blepharitis right eye, upper and lower eyelids: Secondary | ICD-10-CM | POA: Diagnosis not present

## 2021-03-14 DIAGNOSIS — H02834 Dermatochalasis of left upper eyelid: Secondary | ICD-10-CM | POA: Insufficient documentation

## 2021-03-14 HISTORY — PX: CATARACT EXTRACTION W/PHACO: SHX586

## 2021-03-14 LAB — GLUCOSE, CAPILLARY: Glucose-Capillary: 121 mg/dL — ABNORMAL HIGH (ref 70–99)

## 2021-03-14 SURGERY — PHACOEMULSIFICATION, CATARACT, WITH IOL INSERTION
Anesthesia: Monitor Anesthesia Care | Site: Eye | Laterality: Right

## 2021-03-14 MED ORDER — SODIUM HYALURONATE 10 MG/ML IO SOLUTION
PREFILLED_SYRINGE | INTRAOCULAR | Status: DC | PRN
  Administered 2021-03-14: 0.85 mL via INTRAOCULAR

## 2021-03-14 MED ORDER — NEOMYCIN-POLYMYXIN-DEXAMETH 3.5-10000-0.1 OP SUSP
OPHTHALMIC | Status: DC | PRN
  Administered 2021-03-14: 1 [drp] via OPHTHALMIC

## 2021-03-14 MED ORDER — LIDOCAINE HCL (PF) 1 % IJ SOLN
INTRAOCULAR | Status: DC | PRN
  Administered 2021-03-14: 1 mL via OPHTHALMIC

## 2021-03-14 MED ORDER — TROPICAMIDE 1 % OP SOLN
1.0000 [drp] | OPHTHALMIC | Status: AC
  Administered 2021-03-14 (×3): 1 [drp] via OPHTHALMIC

## 2021-03-14 MED ORDER — EPINEPHRINE PF 1 MG/ML IJ SOLN
INTRAOCULAR | Status: DC | PRN
  Administered 2021-03-14: 500 mL

## 2021-03-14 MED ORDER — PHENYLEPHRINE HCL 2.5 % OP SOLN
1.0000 [drp] | OPHTHALMIC | Status: AC | PRN
  Administered 2021-03-14 (×3): 1 [drp] via OPHTHALMIC

## 2021-03-14 MED ORDER — STERILE WATER FOR IRRIGATION IR SOLN
Status: DC | PRN
  Administered 2021-03-14: 250 mL

## 2021-03-14 MED ORDER — MIDAZOLAM HCL 2 MG/2ML IJ SOLN
INTRAMUSCULAR | Status: DC | PRN
  Administered 2021-03-14: 1 mg via INTRAVENOUS

## 2021-03-14 MED ORDER — POVIDONE-IODINE 5 % OP SOLN
OPHTHALMIC | Status: DC | PRN
  Administered 2021-03-14: 1 via OPHTHALMIC

## 2021-03-14 MED ORDER — MIDAZOLAM HCL 2 MG/2ML IJ SOLN
INTRAMUSCULAR | Status: AC
  Filled 2021-03-14: qty 2

## 2021-03-14 MED ORDER — SODIUM CHLORIDE 0.9% FLUSH
INTRAVENOUS | Status: DC | PRN
  Administered 2021-03-14: 5 mL via INTRAVENOUS

## 2021-03-14 MED ORDER — SODIUM CHLORIDE (PF) 0.9 % IJ SOLN
INTRAMUSCULAR | Status: AC
  Filled 2021-03-14: qty 10

## 2021-03-14 MED ORDER — SODIUM HYALURONATE 23MG/ML IO SOSY
PREFILLED_SYRINGE | INTRAOCULAR | Status: DC | PRN
  Administered 2021-03-14: 0.6 mL via INTRAOCULAR

## 2021-03-14 MED ORDER — BSS IO SOLN
INTRAOCULAR | Status: DC | PRN
  Administered 2021-03-14: 15 mL via INTRAOCULAR

## 2021-03-14 MED ORDER — TETRACAINE HCL 0.5 % OP SOLN
1.0000 [drp] | OPHTHALMIC | Status: AC | PRN
  Administered 2021-03-14 (×3): 1 [drp] via OPHTHALMIC

## 2021-03-14 MED ORDER — LIDOCAINE HCL 3.5 % OP GEL
1.0000 "application " | Freq: Once | OPHTHALMIC | Status: AC
  Administered 2021-03-14: 1 via OPHTHALMIC

## 2021-03-14 SURGICAL SUPPLY — 12 items
ACRYSOF IQ ASPHERIC IOL (Intraocular Lens) ×2 IMPLANT
CLOTH BEACON ORANGE TIMEOUT ST (SAFETY) ×2 IMPLANT
EYE SHIELD UNIVERSAL CLEAR (GAUZE/BANDAGES/DRESSINGS) ×2 IMPLANT
GLOVE SURG UNDER POLY LF SZ6.5 (GLOVE) ×2 IMPLANT
GLOVE SURG UNDER POLY LF SZ7 (GLOVE) ×2 IMPLANT
NEEDLE HYPO 18GX1.5 BLUNT FILL (NEEDLE) ×2 IMPLANT
PAD ARMBOARD 7.5X6 YLW CONV (MISCELLANEOUS) ×2 IMPLANT
RING MALYGIN 7.0 (MISCELLANEOUS) IMPLANT
SYR TB 1ML LL NO SAFETY (SYRINGE) ×2 IMPLANT
TAPE SURG TRANSPORE 1 IN (GAUZE/BANDAGES/DRESSINGS) ×1 IMPLANT
TAPE SURGICAL TRANSPORE 1 IN (GAUZE/BANDAGES/DRESSINGS) ×2
WATER STERILE IRR 250ML POUR (IV SOLUTION) ×2 IMPLANT

## 2021-03-14 NOTE — Transfer of Care (Signed)
Immediate Anesthesia Transfer of Care Note  Patient: [name]  Procedure(s) Performed: CATARACT EXTRACTION PHACO AND INTRAOCULAR LENS PLACEMENT RIGHT EYE (Right: Eye)  Patient Location: Short Stay  Anesthesia Type:MAC  Level of Consciousness: awake, alert  and oriented  Airway & Oxygen Therapy: Patient Spontanous Breathing  Post-op Assessment: Report given to RN and Post -op Vital signs reviewed and stable  Post vital signs: Reviewed and stable  Last Vitals:  Vitals Value Taken Time  BP 192/92 03/14/21 1005  Temp 36.9 C 03/14/21 1005  Pulse 66 03/14/21 1005  Resp 15 03/14/21 1005  SpO2 97 % 03/14/21 1005    Last Pain:  Vitals:   03/14/21 1005  TempSrc: Oral  PainSc: 0-No pain         Complications: No notable events documented.

## 2021-03-14 NOTE — Anesthesia Procedure Notes (Signed)
Procedure Name: MAC Date/Time: 03/14/2021 9:46 AM Performed by: Orlie Dakin, CRNA Pre-anesthesia Checklist: Patient identified, Emergency Drugs available, Suction available and Patient being monitored Patient Re-evaluated:Patient Re-evaluated prior to induction Oxygen Delivery Method: Nasal cannula Placement Confirmation: positive ETCO2

## 2021-03-14 NOTE — Op Note (Addendum)
Date of procedure: 03/14/21  Pre-operative diagnosis: Visually significant cataract combined-form, Right Eye; Poor Dilation, Right Eye (H25.811; H21.81)  Post-operative diagnosis: Visually significant cataract, Right Eye; Intra-operative Floppy Iris Syndrome, Right Eye  Procedure: Removal of cataract via phacoemulsification and insertion of intra-ocular lens Alcon [id number] +21.0D into the capsular bag of the Right Eye (CPT [phone])  Attending surgeon: Gerda Diss. Seraya Jobst, MD, MA  Anesthesia: MAC, Topical Akten  Complications: None  Estimated Blood Loss: <55m (minimal)  Specimens: None  Implants: As above  Indications:  Visually significant cataract, Right Eye  Procedure:  The patient was seen and identified in the pre-operative area. The operative eye was identified and dilated.  The operative eye was marked.  Topical anesthesia was administered to the operative eye.     The patient was then to the operative suite and placed in the supine position.  A timeout was performed confirming the patient, procedure to be performed, and all other relevant information.   The patient's face was prepped and draped in the usual fashion for intra-ocular surgery.  A lid speculum was placed into the operative eye and the surgical microscope moved into place and focused.  Poor dilation of the iris was confirmed.  A superotemporal paracentesis was created using a 20 gauge paracentesis blade.  Shugarcaine was injected into the anterior chamber.  Viscoelastic was injected into the anterior chamber.  A temporal clear-corneal main wound incision was created using a 2.465mmicrokeratome.  A Malyugin ring was placed.  A continuous curvilinear capsulorrhexis was initiated using an irrigating cystitome and completed using capsulorrhexis forceps.  Hydrodissection and hydrodeliniation were performed.  Viscoelastic was injected into the anterior chamber.  A phacoemulsification handpiece and a chopper as a second instrument were  used to remove the nucleus and epinucleus. The irrigation/aspiration handpiece was used to remove any remaining cortical material.   The capsular bag was reinflated with viscoelastic, checked, and found to be intact.  The intraocular lens was inserted into the capsular bag and dialed into place using a MaSurveyor, minerals The Malyugin ring was removed.  The irrigation/aspiration handpiece was used to remove any remaining viscoelastic.  The clear corneal wound and paracentesis wounds were then hydrated and checked with Weck-Cels to be watertight.  The lid-speculum and drape was removed, and the patient's face was cleaned with a wet and dry 4x4.  Maxitrol was instilled in the eye before a clear shield was taped over the eye. The patient was taken to the post-operative care unit in good condition, having tolerated the procedure well.  Post-Op Instructions: [facility] and will receive all other orders and instructions.

## 2021-03-14 NOTE — Anesthesia Postprocedure Evaluation (Signed)
Anesthesia Post Note  Patient: Tenelle Andreason  Procedure(s) Performed: CATARACT EXTRACTION PHACO AND INTRAOCULAR LENS PLACEMENT RIGHT EYE (Right: Eye)  Patient location during evaluation: Phase II Anesthesia Type: MAC Level of consciousness: awake Pain management: pain level controlled Vital Signs Assessment: post-procedure vital signs reviewed and stable Respiratory status: spontaneous breathing and respiratory function stable Cardiovascular status: blood pressure returned to baseline and stable Postop Assessment: no headache and no apparent nausea or vomiting Anesthetic complications: no Comments: Late entry   No notable events documented.   Last Vitals:  Vitals:   03/14/21 0846 03/14/21 1005  BP: (!) 193/77 (!) 192/92  Pulse: 77 66  Resp: 11 15  Temp: 36.9 C 36.9 C  SpO2:  97%    Last Pain:  Vitals:   03/14/21 1005  TempSrc: Oral  PainSc: 0-No pain                 Louann Sjogren

## 2021-03-14 NOTE — Anesthesia Preprocedure Evaluation (Signed)
Anesthesia Evaluation  Patient identified by MRN, date of birth, ID band Patient awake    Reviewed: Allergy & Precautions, H&P , NPO status , Patient's Chart, lab work & pertinent test results, reviewed documented beta blocker date and time   Airway Mallampati: II  TM Distance: >3 FB Neck ROM: full    Dental no notable dental hx.    Pulmonary neg pulmonary ROS,    Pulmonary exam normal breath sounds clear to auscultation       Cardiovascular Exercise Tolerance: Good hypertension, negative cardio ROS   Rhythm:regular Rate:Normal     Neuro/Psych negative neurological ROS  negative psych ROS   GI/Hepatic negative GI ROS, Neg liver ROS,   Endo/Other  negative endocrine ROSdiabetes  Renal/GU CRFRenal disease  negative genitourinary   Musculoskeletal   Abdominal   Peds  Hematology negative hematology ROS (+)   Anesthesia Other Findings   Reproductive/Obstetrics negative OB ROS                             Anesthesia Physical Anesthesia Plan  ASA: 2  Anesthesia Plan: MAC   Post-op Pain Management:    Induction:   PONV Risk Score and Plan:   Airway Management Planned:   Additional Equipment:   Intra-op Plan:   Post-operative Plan:   Informed Consent: I have reviewed the patients History and Physical, chart, labs and discussed the procedure including the risks, benefits and alternatives for the proposed anesthesia with the patient or authorized representative who has indicated his/her understanding and acceptance.     Dental Advisory Given  Plan Discussed with: CRNA  Anesthesia Plan Comments:         Anesthesia Quick Evaluation

## 2021-03-14 NOTE — Discharge Instructions (Signed)
Please discharge patient when stable, will follow up today with Dr. Azoria Abbett at the [facility] [place] office immediately following discharge.  Leave shield in place until visit.  All paperwork with discharge instructions will be given at the office.  [facility] Address:  [address]  [place], [place] [address]  

## 2021-03-14 NOTE — Interval H&P Note (Signed)
History and Physical Interval Note:  03/14/2021 9:29 AM  [name]  has presented today for surgery, with the diagnosis of Nuclear sclerotic cataract - Right eye.  The various methods of treatment have been discussed with the patient and family. After consideration of risks, benefits and other options for treatment, the patient has consented to  Procedure(s) with comments: CATARACT EXTRACTION PHACO AND INTRAOCULAR LENS PLACEMENT (IOC) (Right) - right as a surgical intervention.  The patient's history has been reviewed, patient examined, no change in status, stable for surgery.  I have reviewed the patient's chart and labs.  Questions were answered to the patient's satisfaction.     Baruch Goldmann

## 2021-03-17 ENCOUNTER — Encounter (HOSPITAL_COMMUNITY): Payer: Self-pay | Admitting: Ophthalmology

## 2021-09-25 DIAGNOSIS — M5410 Radiculopathy, site unspecified: Secondary | ICD-10-CM | POA: Diagnosis not present

## 2021-09-25 DIAGNOSIS — I10 Essential (primary) hypertension: Secondary | ICD-10-CM | POA: Diagnosis not present

## 2021-09-26 DIAGNOSIS — I10 Essential (primary) hypertension: Secondary | ICD-10-CM | POA: Diagnosis not present

## 2021-09-26 DIAGNOSIS — R7301 Impaired fasting glucose: Secondary | ICD-10-CM | POA: Diagnosis not present

## 2021-10-02 ENCOUNTER — Other Ambulatory Visit (HOSPITAL_COMMUNITY): Payer: Self-pay | Admitting: Family Medicine

## 2021-10-02 DIAGNOSIS — E1165 Type 2 diabetes mellitus with hyperglycemia: Secondary | ICD-10-CM | POA: Diagnosis not present

## 2021-10-02 DIAGNOSIS — M810 Age-related osteoporosis without current pathological fracture: Secondary | ICD-10-CM | POA: Diagnosis not present

## 2021-10-02 DIAGNOSIS — Z0001 Encounter for general adult medical examination with abnormal findings: Secondary | ICD-10-CM | POA: Diagnosis not present

## 2021-10-02 DIAGNOSIS — E782 Mixed hyperlipidemia: Secondary | ICD-10-CM | POA: Diagnosis not present

## 2021-10-02 DIAGNOSIS — E875 Hyperkalemia: Secondary | ICD-10-CM | POA: Diagnosis not present

## 2021-10-02 DIAGNOSIS — N1831 Chronic kidney disease, stage 3a: Secondary | ICD-10-CM | POA: Diagnosis not present

## 2021-10-02 DIAGNOSIS — I10 Essential (primary) hypertension: Secondary | ICD-10-CM | POA: Diagnosis not present

## 2021-10-02 DIAGNOSIS — H269 Unspecified cataract: Secondary | ICD-10-CM | POA: Diagnosis not present

## 2021-10-02 DIAGNOSIS — Z853 Personal history of malignant neoplasm of breast: Secondary | ICD-10-CM | POA: Diagnosis not present

## 2021-10-16 DIAGNOSIS — I10 Essential (primary) hypertension: Secondary | ICD-10-CM | POA: Diagnosis not present

## 2021-10-17 ENCOUNTER — Encounter (HOSPITAL_COMMUNITY): Payer: Self-pay | Admitting: *Deleted

## 2021-10-17 ENCOUNTER — Emergency Department (HOSPITAL_COMMUNITY): Payer: Medicare Other

## 2021-10-17 ENCOUNTER — Emergency Department (HOSPITAL_COMMUNITY)
Admission: EM | Admit: 2021-10-17 | Discharge: 2021-10-17 | Disposition: A | Discharge status: Home / Self Care | Payer: Medicare Other | Attending: Emergency Medicine | Admitting: Emergency Medicine

## 2021-10-17 DIAGNOSIS — R519 Headache, unspecified: Secondary | ICD-10-CM | POA: Diagnosis not present

## 2021-10-17 LAB — BASIC METABOLIC PANEL
Anion gap: 10 (ref 5–15)
BUN: 18 mg/dL (ref 8–23)
CO2: 23 mmol/L (ref 22–32)
Calcium: 9.1 mg/dL (ref 8.9–10.3)
Chloride: 104 mmol/L (ref 98–111)
Creatinine, Ser: 1.25 mg/dL — ABNORMAL HIGH (ref 0.44–1.00)
GFR, Estimated: 43 mL/min — ABNORMAL LOW (ref >60)
Glucose, Bld: 141 mg/dL — ABNORMAL HIGH (ref 70–99)
Potassium: 4.3 mmol/L (ref 3.5–5.1)
Sodium: 137 mmol/L (ref 135–145)

## 2021-10-17 LAB — CBC WITH DIFFERENTIAL/PLATELET
Abs Immature Granulocytes: 0.01 10*3/uL (ref 0.00–0.07)
Basophils Absolute: 0 10*3/uL (ref 0.0–0.1)
Basophils Relative: 1 %
Eosinophils Absolute: 0.1 10*3/uL (ref 0.0–0.5)
Eosinophils Relative: 3 %
HCT: 33.5 % — ABNORMAL LOW (ref 36.0–46.0)
Hemoglobin: 10.9 g/dL — ABNORMAL LOW (ref 12.0–15.0)
Immature Granulocytes: 0 %
Lymphocytes Relative: 22 %
Lymphs Abs: 1.1 10*3/uL (ref 0.7–4.0)
MCH: 29.8 pg (ref 26.0–34.0)
MCHC: 32.5 g/dL (ref 30.0–36.0)
MCV: 91.5 fL (ref 80.0–100.0)
Monocytes Absolute: 0.4 10*3/uL (ref 0.1–1.0)
Monocytes Relative: 8 %
Neutro Abs: 3.2 10*3/uL (ref 1.7–7.7)
Neutrophils Relative %: 66 %
Platelets: 234 10*3/uL (ref 150–400)
RBC: 3.66 MIL/uL — ABNORMAL LOW (ref 3.87–5.11)
RDW: 13.1 % (ref 11.5–15.5)
WBC: 4.9 10*3/uL (ref 4.0–10.5)
nRBC: 0 % (ref 0.0–0.2)

## 2021-10-17 MED ORDER — KETOROLAC TROMETHAMINE 15 MG/ML IJ SOLN
15.0000 mg | Freq: Once | INTRAMUSCULAR | Status: AC
  Administered 2021-10-17: 15 mg via INTRAVENOUS
  Filled 2021-10-17: qty 1

## 2021-10-17 MED ORDER — ACETAMINOPHEN 325 MG PO TABS
650.0000 mg | ORAL_TABLET | Freq: Once | ORAL | Status: AC
  Administered 2021-10-17: 650 mg via ORAL
  Filled 2021-10-17: qty 2

## 2021-10-17 NOTE — ED Triage Notes (Signed)
Headache for 2-3 days

## 2021-10-17 NOTE — ED Provider Notes (Signed)
[facility] Provider Note   CSN: [id number] Arrival date & time: 10/17/21  1105     History  Chief Complaint  Patient presents with   Headache    [name] is a 81 y.o. female.  Patient presents with headache for 2 to 3 days.  Describes as a throbbing pain gradual onset.  Intensity is 3-4 out of 10 per patient.  She took aspirin without improvement and presents to the ER.  Denies any vomiting or diarrhea.  She thought her blood pressure was elevated at home as well and decided come into the ER today.      Home Medications Prior to Admission medications   Medication Sig Start Date End Date Taking? Authorizing Provider  amLODipine (NORVASC) 10 MG tablet Take 10 mg by mouth daily. 10/02/21  Yes [provider]  anastrozole (ARIMIDEX) 1 MG tablet Take 1 tablet (1 mg total) by mouth daily. 01/13/21  Yes Derek Jack, MD  atorvastatin (LIPITOR) 40 MG tablet Take 40 mg by mouth daily. 10/02/21  Yes [provider]  losartan (COZAAR) 50 MG tablet Take 50 mg by mouth 2 (two) times daily. 04/12/20  Yes [provider]  magnesium gluconate (MAGONATE) 500 MG tablet Take 500 mg by mouth daily.   Yes [provider]  Menthol-Camphor 11-11 % CREA Apply 1 application topically 3 (three) times daily as needed (for pain (back)).    Yes [provider]  Polyethyl Glycol-Propyl Glycol (SYSTANE) 0.4-0.3 % SOLN Place 1 drop into both eyes daily.   Yes [provider]      Allergies    Patient has no known allergies.    Review of Systems   Review of Systems  Constitutional:  Negative for fever.  HENT:  Negative for ear pain.   Eyes:  Negative for pain.  Respiratory:  Negative for cough.   Cardiovascular:  Negative for chest pain.  Gastrointestinal:  Negative for abdominal pain.  Genitourinary:  Negative for flank pain.  Musculoskeletal:  Negative for back pain.  Skin:  Negative for rash.  Neurological:   Positive for headaches.   Physical Exam Updated Vital Signs BP 140/66    Pulse 71    Temp 98 F (36.7 C)    Resp 12    LMP 12/05/2019    SpO2 96%  Physical Exam Constitutional:      General: She is not in acute distress.    Appearance: Normal appearance.  HENT:     Head: Normocephalic.     Nose: Nose normal.  Eyes:     Extraocular Movements: Extraocular movements intact.  Cardiovascular:     Rate and Rhythm: Normal rate.  Pulmonary:     Effort: Pulmonary effort is normal.  Musculoskeletal:        General: Normal range of motion.     Cervical back: Normal range of motion.  Neurological:     General: No focal deficit present.     Mental Status: She is alert and oriented to person, place, and time. Mental status is at baseline.     Cranial Nerves: No cranial nerve deficit.     Motor: No weakness.    ED Results / Procedures / Treatments   Labs (all labs ordered are listed, but only abnormal results are displayed) Labs Reviewed  CBC WITH DIFFERENTIAL/PLATELET - Abnormal; Notable for the following components:      Result Value   RBC 3.66 (*)    Hemoglobin 10.9 (*)  HCT 33.5 (*)    All other components within normal limits  BASIC METABOLIC PANEL - Abnormal; Notable for the following components:   Glucose, Bld 141 (*)    Creatinine, Ser 1.25 (*)    GFR, Estimated 43 (*)    All other components within normal limits    EKG None  Radiology CT Head Wo Contrast  Result Date: 10/17/2021 CLINICAL DATA:  Headache, sudden, severe EXAM: CT HEAD WITHOUT CONTRAST TECHNIQUE: Contiguous axial images were obtained from the base of the skull through the vertex without intravenous contrast. RADIATION DOSE REDUCTION: This exam was performed according to the departmental dose-optimization program which includes automated exposure control, adjustment of the mA and/or kV according to patient size and/or use of iterative reconstruction technique. COMPARISON:  None. FINDINGS: Brain: No  evidence of acute intracranial hemorrhage or extra-axial collection.No evidence of mass lesion/concern mass effect.The ventricles are normal in size. Vascular: Vascular calcifications. Skull: Normal. Negative for fracture or focal lesion. Sinuses/Orbits: No acute finding. Other: None. IMPRESSION: No acute intracranial abnormality. Electronically Signed   By: Maurine Simmering M.D.   On: 10/17/2021 13:02    Procedures Procedures    Medications Ordered in ED Medications  ketorolac (TORADOL) 15 MG/ML injection 15 mg (15 mg Intravenous Given 10/17/21 1326)  acetaminophen (TYLENOL) tablet 650 mg (650 mg Oral Given 10/17/21 1326)    ED Course/ Medical Decision Making/ A&P                           Medical Decision Making Amount and/or Complexity of Data Reviewed Labs: ordered. Radiology: ordered.  Risk OTC drugs. Prescription drug management.   Review of records shows cataract surgery July 2022 otherwise no prior visits since then noted.  Labs today unremarkable white count normal hemoglobin normal chemistry unremarkable.  CT scan of the brain shows no acute findings per radiologist.  Patient given Tylenol and Toradol with subsequent resolution of pain.  I doubt subarachnoid hemorrhage or meningitis.  Patient discharged home in stable condition advised immediate return for recurrent or worsening symptoms fevers or any additional concerns, otherwise follow-up with her doctor this week.        Final Clinical Impression(s) / ED Diagnoses Final diagnoses:  Acute nonintractable headache, unspecified headache type    Rx / DC Orders ED Discharge Orders     None         Luna Fuse, MD 10/17/21 1438

## 2021-10-17 NOTE — Discharge Instructions (Signed)
Call your primary care doctor or specialist as discussed in the next 2-3 days.   Return immediately back to the ER if:  Your symptoms worsen within the next 12-24 hours. You develop new symptoms such as new fevers, persistent vomiting, new pain, shortness of breath, or new weakness or numbness, or if you have any other concerns.  

## 2021-10-17 NOTE — ED Notes (Signed)
Patient Alert and oriented to baseline. Stable and ambulatory to baseline. Patient verbalized understanding of the discharge instructions.  Patient belongings were taken by the patient.   

## 2021-10-23 DIAGNOSIS — I10 Essential (primary) hypertension: Secondary | ICD-10-CM | POA: Diagnosis not present

## 2021-10-29 DIAGNOSIS — E875 Hyperkalemia: Secondary | ICD-10-CM | POA: Diagnosis not present

## 2021-10-29 DIAGNOSIS — R600 Localized edema: Secondary | ICD-10-CM | POA: Diagnosis not present

## 2021-10-29 DIAGNOSIS — I129 Hypertensive chronic kidney disease with stage 1 through stage 4 chronic kidney disease, or unspecified chronic kidney disease: Secondary | ICD-10-CM | POA: Diagnosis not present

## 2021-10-29 DIAGNOSIS — N189 Chronic kidney disease, unspecified: Secondary | ICD-10-CM | POA: Diagnosis not present

## 2021-10-29 DIAGNOSIS — E559 Vitamin D deficiency, unspecified: Secondary | ICD-10-CM | POA: Diagnosis not present

## 2021-10-29 DIAGNOSIS — E1122 Type 2 diabetes mellitus with diabetic chronic kidney disease: Secondary | ICD-10-CM | POA: Diagnosis not present

## 2021-10-31 DIAGNOSIS — E559 Vitamin D deficiency, unspecified: Secondary | ICD-10-CM | POA: Diagnosis not present

## 2021-10-31 DIAGNOSIS — E875 Hyperkalemia: Secondary | ICD-10-CM | POA: Diagnosis not present

## 2021-10-31 DIAGNOSIS — E1122 Type 2 diabetes mellitus with diabetic chronic kidney disease: Secondary | ICD-10-CM | POA: Diagnosis not present

## 2021-10-31 DIAGNOSIS — I129 Hypertensive chronic kidney disease with stage 1 through stage 4 chronic kidney disease, or unspecified chronic kidney disease: Secondary | ICD-10-CM | POA: Diagnosis not present

## 2021-10-31 DIAGNOSIS — N189 Chronic kidney disease, unspecified: Secondary | ICD-10-CM | POA: Diagnosis not present

## 2021-11-03 ENCOUNTER — Other Ambulatory Visit (HOSPITAL_COMMUNITY): Payer: Self-pay | Admitting: Physician Assistant

## 2021-11-03 DIAGNOSIS — Z9889 Other specified postprocedural states: Secondary | ICD-10-CM

## 2021-11-03 DIAGNOSIS — R600 Localized edema: Secondary | ICD-10-CM | POA: Diagnosis not present

## 2021-11-07 ENCOUNTER — Other Ambulatory Visit: Payer: Self-pay | Admitting: Nephrology

## 2021-11-07 ENCOUNTER — Other Ambulatory Visit (HOSPITAL_COMMUNITY): Payer: Self-pay | Admitting: Nephrology

## 2021-11-07 DIAGNOSIS — E875 Hyperkalemia: Secondary | ICD-10-CM

## 2021-11-07 DIAGNOSIS — I129 Hypertensive chronic kidney disease with stage 1 through stage 4 chronic kidney disease, or unspecified chronic kidney disease: Secondary | ICD-10-CM

## 2021-11-07 DIAGNOSIS — E1122 Type 2 diabetes mellitus with diabetic chronic kidney disease: Secondary | ICD-10-CM

## 2021-11-13 DIAGNOSIS — R600 Localized edema: Secondary | ICD-10-CM | POA: Diagnosis not present

## 2021-11-13 DIAGNOSIS — R778 Other specified abnormalities of plasma proteins: Secondary | ICD-10-CM | POA: Diagnosis not present

## 2021-11-13 DIAGNOSIS — N189 Chronic kidney disease, unspecified: Secondary | ICD-10-CM | POA: Diagnosis not present

## 2021-11-13 DIAGNOSIS — E1122 Type 2 diabetes mellitus with diabetic chronic kidney disease: Secondary | ICD-10-CM | POA: Diagnosis not present

## 2021-11-13 DIAGNOSIS — I129 Hypertensive chronic kidney disease with stage 1 through stage 4 chronic kidney disease, or unspecified chronic kidney disease: Secondary | ICD-10-CM | POA: Diagnosis not present

## 2021-11-14 ENCOUNTER — Other Ambulatory Visit (HOSPITAL_COMMUNITY): Payer: Self-pay | Admitting: Nephrology

## 2021-11-14 DIAGNOSIS — I129 Hypertensive chronic kidney disease with stage 1 through stage 4 chronic kidney disease, or unspecified chronic kidney disease: Secondary | ICD-10-CM

## 2021-11-18 ENCOUNTER — Ambulatory Visit (HOSPITAL_COMMUNITY)
Admission: RE | Admit: 2021-11-18 | Discharge: 2021-11-18 | Disposition: A | Discharge status: Home / Self Care | Payer: Medicare Other | Source: Ambulatory Visit | Attending: Physician Assistant | Admitting: Physician Assistant

## 2021-11-18 ENCOUNTER — Inpatient Hospital Stay (HOSPITAL_COMMUNITY): Payer: Medicare Other | Attending: Hematology

## 2021-11-18 ENCOUNTER — Encounter (HOSPITAL_COMMUNITY): Payer: Self-pay

## 2021-11-18 ENCOUNTER — Other Ambulatory Visit (HOSPITAL_COMMUNITY): Payer: Medicare Other

## 2021-11-18 ENCOUNTER — Other Ambulatory Visit: Payer: Self-pay

## 2021-11-18 DIAGNOSIS — R200 Anesthesia of skin: Secondary | ICD-10-CM | POA: Insufficient documentation

## 2021-11-18 DIAGNOSIS — M7989 Other specified soft tissue disorders: Secondary | ICD-10-CM | POA: Diagnosis not present

## 2021-11-18 DIAGNOSIS — Z170 Estrogen receptor positive status [ER+]: Secondary | ICD-10-CM | POA: Insufficient documentation

## 2021-11-18 DIAGNOSIS — C50211 Malignant neoplasm of upper-inner quadrant of right female breast: Secondary | ICD-10-CM | POA: Diagnosis not present

## 2021-11-18 DIAGNOSIS — Z9889 Other specified postprocedural states: Secondary | ICD-10-CM | POA: Insufficient documentation

## 2021-11-18 DIAGNOSIS — Z79811 Long term (current) use of aromatase inhibitors: Secondary | ICD-10-CM | POA: Insufficient documentation

## 2021-11-18 DIAGNOSIS — R922 Inconclusive mammogram: Secondary | ICD-10-CM | POA: Diagnosis not present

## 2021-11-18 DIAGNOSIS — R918 Other nonspecific abnormal finding of lung field: Secondary | ICD-10-CM | POA: Diagnosis not present

## 2021-11-18 LAB — FERRITIN: Ferritin: 76 ng/mL (ref 11–307)

## 2021-11-18 LAB — COMPREHENSIVE METABOLIC PANEL
ALT: 18 U/L (ref 0–44)
AST: 28 U/L (ref 15–41)
Albumin: 3.8 g/dL (ref 3.5–5.0)
Alkaline Phosphatase: 75 U/L (ref 38–126)
Anion gap: 10 (ref 5–15)
BUN: 18 mg/dL (ref 8–23)
CO2: 26 mmol/L (ref 22–32)
Calcium: 9.3 mg/dL (ref 8.9–10.3)
Chloride: 101 mmol/L (ref 98–111)
Creatinine, Ser: 1.45 mg/dL — ABNORMAL HIGH (ref 0.44–1.00)
GFR, Estimated: 36 mL/min — ABNORMAL LOW (ref >60)
Glucose, Bld: 165 mg/dL — ABNORMAL HIGH (ref 70–99)
Potassium: 4.2 mmol/L (ref 3.5–5.1)
Sodium: 137 mmol/L (ref 135–145)
Total Bilirubin: 0.5 mg/dL (ref 0.3–1.2)
Total Protein: 7.4 g/dL (ref 6.5–8.1)

## 2021-11-18 LAB — CBC WITH DIFFERENTIAL/PLATELET
Abs Immature Granulocytes: 0.02 10*3/uL (ref 0.00–0.07)
Basophils Absolute: 0 10*3/uL (ref 0.0–0.1)
Basophils Relative: 1 %
Eosinophils Absolute: 0.3 10*3/uL (ref 0.0–0.5)
Eosinophils Relative: 7 %
HCT: 35.4 % — ABNORMAL LOW (ref 36.0–46.0)
Hemoglobin: 11 g/dL — ABNORMAL LOW (ref 12.0–15.0)
Immature Granulocytes: 0 %
Lymphocytes Relative: 20 %
Lymphs Abs: 1 10*3/uL (ref 0.7–4.0)
MCH: 29.1 pg (ref 26.0–34.0)
MCHC: 31.1 g/dL (ref 30.0–36.0)
MCV: 93.7 fL (ref 80.0–100.0)
Monocytes Absolute: 0.4 10*3/uL (ref 0.1–1.0)
Monocytes Relative: 8 %
Neutro Abs: 3.3 10*3/uL (ref 1.7–7.7)
Neutrophils Relative %: 64 %
Platelets: 249 10*3/uL (ref 150–400)
RBC: 3.78 MIL/uL — ABNORMAL LOW (ref 3.87–5.11)
RDW: 12.8 % (ref 11.5–15.5)
WBC: 5.1 10*3/uL (ref 4.0–10.5)
nRBC: 0 % (ref 0.0–0.2)

## 2021-11-18 LAB — IRON AND TIBC
Iron: 69 ug/dL (ref 28–170)
Saturation Ratios: 18 % (ref 10.4–31.8)
TIBC: 389 ug/dL (ref 250–450)
UIBC: 320 ug/dL

## 2021-11-18 LAB — VITAMIN D 25 HYDROXY (VIT D DEFICIENCY, FRACTURES): Vit D, 25-Hydroxy: 47.3 ng/mL (ref 30–100)

## 2021-11-18 LAB — VITAMIN B12: Vitamin B-12: 477 pg/mL (ref 180–914)

## 2021-11-18 LAB — LACTATE DEHYDROGENASE: LDH: 172 U/L (ref 98–192)

## 2021-11-20 ENCOUNTER — Other Ambulatory Visit: Payer: Self-pay

## 2021-11-20 ENCOUNTER — Ambulatory Visit (HOSPITAL_COMMUNITY)
Admission: RE | Admit: 2021-11-20 | Discharge: 2021-11-20 | Disposition: A | Discharge status: Home / Self Care | Payer: Medicare Other | Source: Ambulatory Visit | Attending: Nephrology | Admitting: Nephrology

## 2021-11-20 DIAGNOSIS — I129 Hypertensive chronic kidney disease with stage 1 through stage 4 chronic kidney disease, or unspecified chronic kidney disease: Secondary | ICD-10-CM | POA: Diagnosis not present

## 2021-11-20 DIAGNOSIS — E875 Hyperkalemia: Secondary | ICD-10-CM | POA: Diagnosis not present

## 2021-11-20 DIAGNOSIS — N189 Chronic kidney disease, unspecified: Secondary | ICD-10-CM | POA: Diagnosis not present

## 2021-11-20 DIAGNOSIS — E1122 Type 2 diabetes mellitus with diabetic chronic kidney disease: Secondary | ICD-10-CM

## 2021-11-24 NOTE — Progress Notes (Deleted)
? ?[facility] ?[address] ?[place], [place] [address] ? ? ?CLINIC:  ?Medical Oncology/Hematology ? ?PCP:  ?Celene Squibb, MD ?[address] [place] [address] ?[phone] ? ? ?REASON FOR VISIT: Follow-up for breast cancer ?  ?PRIOR THERAPY: Right lumpectomy with sentinel lymph node biopsy (10/28/2016) ?  ?CURRENT THERAPY: Arimidex started April 2018, goal is 10 years of therapy ? ?CANCER STAGING: ?Cancer Staging  ?Malignant neoplasm of upper-inner quadrant of right female breast ([place]) ?Staging form: Breast, AJCC 8th Edition ?- Clinical: Stage IA (cT1b, cN0(sn), cM0, G1, ER+, PR+, HER2-) - Signed by Twana First, MD on 12/07/2016 ? ? ?INTERVAL HISTORY:  ?[name], a 81 y.o. female, returns for routine follow-up of her history of right-sided breast cancer. Jaelin was last seen on 11/26/2020 by Tarri Abernethy PA-C.  ? ?At today's visit, she  reports feeling ***.  She denies any recent hospitalizations, surgeries, or changes in her  baseline health status. ? ?Most recent mammogram on 11/18/2021 showed no mammographic evidence of malignancy. ? ?*** She denies any symptoms of recurrence such as new lumps, bone pain, chest pain, dyspnea, or abdominal pain.  She has no new headaches, seizures, or focal neurologic deficits.  No B symptoms such as fever, chills, night sweats, unintentional weight loss. ? ?She continues to take ?*** ARIMIDEX:  Hot flashes, mood swings, weight gain, alopecia, and musculoskeletal pain ? ?She reports ***% energy and ***% appetite.  She is maintaining stable weight at this time. ? ? ? ?REVIEW OF SYSTEMS:  ?Review of Systems - Oncology ? ?PAST MEDICAL/SURGICAL HISTORY:  ?Past Medical History:  ?Diagnosis Date  ? Breast cancer ([place])   ? CKD (chronic kidney disease) stage 2, GFR 60-89 ml/min   ? Essential hypertension   ? Hyperlipidemia   ? Type 2 diabetes mellitus ([place])   ? diet controlled  ? ?Past Surgical History:  ?Procedure Laterality Date  ? ABDOMINAL  HYSTERECTOMY    ? BREAST BIOPSY Left   ? benign  ? CATARACT EXTRACTION W/PHACO Right 03/14/2021  ? Procedure: CATARACT EXTRACTION PHACO AND INTRAOCULAR LENS PLACEMENT RIGHT EYE;  Surgeon: Baruch Goldmann, MD;  Location: [facility];  Service: Ophthalmology;  Laterality: Right;  right ?CDE=14.05  ? EYE SURGERY Left   ? cataract removal  ? PARTIAL MASTECTOMY WITH NEEDLE LOCALIZATION AND AXILLARY SENTINEL LYMPH NODE BX Right 10/28/2016  ? Procedure: PARTIAL MASTECTOMY WITH NEEDLE LOCALIZATION AND AXILLARY SENTINEL LYMPH NODE BX;  Surgeon: Aviva Signs, MD;  Location: [facility];  Service: General;  Laterality: Right;  ? ? ?SOCIAL HISTORY:  ?Social History  ? ?Socioeconomic History  ? Marital status: Widowed  ?  Spouse name: Not on file  ? Number of children: Not on file  ? Years of education: Not on file  ? Highest education level: Not on file  ?Occupational History  ? Not on file  ?Tobacco Use  ? Smoking status: Never  ? Smokeless tobacco: Never  ?Vaping Use  ? Vaping Use: Never used  ?Substance and Sexual Activity  ? Alcohol use: No  ? Drug use: No  ? Sexual activity: Not Currently  ?  Birth control/protection: Surgical  ?Other Topics Concern  ? Not on file  ?Social History Narrative  ? Not on file  ? ?Social Determinants of Health  ? ?Financial Resource Strain: Not on file  ?Food Insecurity: Not on file  ?Transportation Needs: Not on file  ?Physical Activity: Not on file  ?Stress: Not on file  ?  Social Connections: Not on file  ?Intimate Partner Violence: Not on file  ? ? ?FAMILY HISTORY:  ?Family History  ?Problem Relation Age of Onset  ? Hypertension Mother   ? Hypertension Father   ? Diabetes Mellitus II Sister   ? Diabetes Mellitus II Brother   ? Diabetes Mellitus II Brother   ? ? ?CURRENT MEDICATIONS:  ?Current Outpatient Medications  ?Medication Sig Dispense Refill  ? amLODipine (NORVASC) 10 MG tablet Take 10 mg by mouth daily.    ? anastrozole (ARIMIDEX) 1 MG tablet Take 1 tablet (1 mg total) by mouth daily. 90 tablet 6   ? atorvastatin (LIPITOR) 40 MG tablet Take 40 mg by mouth daily.    ? losartan (COZAAR) 50 MG tablet Take 50 mg by mouth 2 (two) times daily.    ? magnesium gluconate (MAGONATE) 500 MG tablet Take 500 mg by mouth daily.    ? Menthol-Camphor 11-11 % CREA Apply 1 application topically 3 (three) times daily as needed (for pain (back)).     ? Polyethyl Glycol-Propyl Glycol (SYSTANE) 0.4-0.3 % SOLN Place 1 drop into both eyes daily.    ? ?No current facility-administered medications for this visit.  ? ? ?ALLERGIES:  ?No Known Allergies ? ?PHYSICAL EXAM:  ?Performance status (ECOG): {CHL ONC KG:[id number]} ? ?There were no vitals filed for this visit. ?Wt Readings from Last 3 Encounters:  ?11/26/20 178 lb 1.6 oz (80.8 kg)  ?04/19/20 179 lb 12.8 oz (81.6 kg)  ?10/18/19 185 lb 12.8 oz (84.3 kg)  ? ?Physical Exam  ? ?LABORATORY DATA:  ?I have reviewed the labs as listed.  ? ?  Latest Ref Rng & Units 11/18/2021  ? 11:36 AM 10/17/2021  ? 12:34 PM 11/12/2020  ? 10:06 AM  ?CBC  ?WBC 4.0 - 10.5 K/uL 5.1   4.9   4.5    ?Hemoglobin 12.0 - 15.0 g/dL 11.0   10.9   11.2    ?Hematocrit 36.0 - 46.0 % 35.4   33.5   36.4    ?Platelets 150 - 400 K/uL 249   234   263    ? ? ?  Latest Ref Rng & Units 11/18/2021  ? 11:36 AM 10/17/2021  ? 12:34 PM 11/12/2020  ? 10:06 AM  ?CMP  ?Glucose 70 - 99 mg/dL 165   141   177    ?BUN 8 - 23 mg/dL '18   18   22    ' ?Creatinine 0.44 - 1.00 mg/dL 1.45   1.25   1.13    ?Sodium 135 - 145 mmol/L 137   137   139    ?Potassium 3.5 - 5.1 mmol/L 4.2   4.3   4.0    ?Chloride 98 - 111 mmol/L 101   104   104    ?CO2 22 - 32 mmol/L '26   23   24    ' ?Calcium 8.9 - 10.3 mg/dL 9.3   9.1   9.4    ?Total Protein 6.5 - 8.1 g/dL 7.4    7.1    ?Total Bilirubin 0.3 - 1.2 mg/dL 0.5    0.6    ?Alkaline Phos 38 - 126 U/L 75    65    ?AST 15 - 41 U/L 28    25    ?ALT 0 - 44 U/L 18    16    ? ? ?DIAGNOSTIC IMAGING:  ?I have independently reviewed the scans and discussed with the patient. ?US RENAL ? ?Result Date:  11/23/2021 ?CLINICAL DATA:   Chronic kidney disease due to type 2 diabetes mellitus ([place]) EXAM: RENAL / URINARY TRACT ULTRASOUND COMPLETE COMPARISON:  April 10, 2013 renal ultrasound. FINDINGS: Right Kidney: Renal measurements: 9.5 x 4.4 x 4.4 cm = volume: 95 mL. Echogenicity within normal limits. No mass or significant hydronephrosis visualized. Fullness of the renal pelvis. Left Kidney: Renal measurements: 9.5 x 5.5 x 4.8 cm = volume: 131 mL. Echogenicity within normal limits. No mass or hydronephrosis visualized. Bladder: Appears normal for degree of bladder distention. Other: Hepatic steatosis. IMPRESSION: No hydronephrosis.  Hepatic steatosis. Electronically Signed   By: Valentino Saxon M.D.   On: 11/23/2021 11:51  ? ?MM DIAG BREAST TOMO BILATERAL ? ?Result Date: 11/18/2021 ?CLINICAL DATA:  81 year old female with history of remote left lumpectomy and right breast cancer post lumpectomy 2018 followed by radiation therapy. EXAM: DIGITAL DIAGNOSTIC BILATERAL MAMMOGRAM WITH TOMOSYNTHESIS AND CAD TECHNIQUE: Bilateral digital diagnostic mammography and breast tomosynthesis was performed. The images were evaluated with computer-aided detection. COMPARISON:  Previous exams. ACR Breast Density Category b: There are scattered areas of fibroglandular density. FINDINGS: No suspicious masses or calcifications seen in either breast. Lumpectomy changes again identified in the bilateral breasts. Spot compression magnification MLO view of the lumpectomy site in the right breast was performed. There is no mammographic evidence of locally recurrent malignancy. IMPRESSION: Stable lumpectomy changes in the bilateral breast. No mammographic evidence of malignancy. RECOMMENDATION: Screening mammogram in one year.(Code:SM-B-01Y) I have discussed the findings and recommendations with the patient. If applicable, a reminder letter will be sent to the patient regarding the next appointment. BI-RADS CATEGORY  2: Benign. Electronically Signed   By: Everlean Alstrom  M.D.   On: 11/18/2021 11:30    ? ?ASSESSMENT & PLAN: ?1.  Stage Ia invasive ductal carcinoma of the upper-inner quadrant of right breast: ?- Patient was diagnosed in 08/2016 after an abnormal screening mammo

## 2021-11-25 ENCOUNTER — Ambulatory Visit (HOSPITAL_COMMUNITY): Payer: Medicare Other | Admitting: Physician Assistant

## 2021-11-25 ENCOUNTER — Telehealth (HOSPITAL_COMMUNITY): Payer: Self-pay | Admitting: *Deleted

## 2021-11-25 NOTE — Telephone Encounter (Signed)
Called patient to let her know that her mammogram is normal, however  [name] is out sick and in person visit will need to be rescheduled. ?

## 2021-12-01 ENCOUNTER — Other Ambulatory Visit (HOSPITAL_COMMUNITY): Payer: Medicare Other

## 2021-12-02 ENCOUNTER — Ambulatory Visit (HOSPITAL_COMMUNITY)
Admission: RE | Admit: 2021-12-02 | Discharge: 2021-12-02 | Disposition: A | Discharge status: Home / Self Care | Payer: Medicare Other | Source: Ambulatory Visit | Attending: Nephrology | Admitting: Nephrology

## 2021-12-02 DIAGNOSIS — I129 Hypertensive chronic kidney disease with stage 1 through stage 4 chronic kidney disease, or unspecified chronic kidney disease: Secondary | ICD-10-CM | POA: Diagnosis not present

## 2021-12-02 LAB — ECHOCARDIOGRAM COMPLETE
AR max vel: 2.64 cm2
AV Area VTI: 2.6 cm2
AV Area mean vel: 2.43 cm2
AV Mean grad: 7.7 mmHg
AV Peak grad: 11.4 mmHg
Ao pk vel: 1.69 m/s
Area-P 1/2: 1.96 cm2
S' Lateral: 2 cm

## 2021-12-02 NOTE — Progress Notes (Signed)
*  PRELIMINARY RESULTS* ?Echocardiogram ?2D Echocardiogram has been performed. ? ?[name] ?12/02/2021, 12:00 PM ?

## 2022-01-02 DIAGNOSIS — M810 Age-related osteoporosis without current pathological fracture: Secondary | ICD-10-CM | POA: Diagnosis not present

## 2022-01-02 DIAGNOSIS — I10 Essential (primary) hypertension: Secondary | ICD-10-CM | POA: Diagnosis not present

## 2022-01-02 DIAGNOSIS — E1165 Type 2 diabetes mellitus with hyperglycemia: Secondary | ICD-10-CM | POA: Diagnosis not present

## 2022-01-04 NOTE — Progress Notes (Deleted)
NO SHOW

## 2022-01-05 ENCOUNTER — Inpatient Hospital Stay (HOSPITAL_COMMUNITY): Payer: Medicare Other | Attending: Hematology | Admitting: Physician Assistant

## 2022-01-13 DIAGNOSIS — I10 Essential (primary) hypertension: Secondary | ICD-10-CM | POA: Diagnosis not present

## 2022-01-13 DIAGNOSIS — M810 Age-related osteoporosis without current pathological fracture: Secondary | ICD-10-CM | POA: Diagnosis not present

## 2022-01-13 DIAGNOSIS — H269 Unspecified cataract: Secondary | ICD-10-CM | POA: Diagnosis not present

## 2022-01-13 DIAGNOSIS — N1831 Chronic kidney disease, stage 3a: Secondary | ICD-10-CM | POA: Diagnosis not present

## 2022-01-13 DIAGNOSIS — Z853 Personal history of malignant neoplasm of breast: Secondary | ICD-10-CM | POA: Diagnosis not present

## 2022-01-13 DIAGNOSIS — M545 Low back pain, unspecified: Secondary | ICD-10-CM | POA: Diagnosis not present

## 2022-01-13 DIAGNOSIS — E782 Mixed hyperlipidemia: Secondary | ICD-10-CM | POA: Diagnosis not present

## 2022-01-13 DIAGNOSIS — E875 Hyperkalemia: Secondary | ICD-10-CM | POA: Diagnosis not present

## 2022-01-13 DIAGNOSIS — E1165 Type 2 diabetes mellitus with hyperglycemia: Secondary | ICD-10-CM | POA: Diagnosis not present

## 2022-02-12 ENCOUNTER — Other Ambulatory Visit (HOSPITAL_COMMUNITY): Payer: Self-pay | Admitting: Hematology

## 2022-02-12 ENCOUNTER — Other Ambulatory Visit (HOSPITAL_COMMUNITY): Payer: Self-pay | Admitting: *Deleted

## 2022-02-12 DIAGNOSIS — Z170 Estrogen receptor positive status [ER+]: Secondary | ICD-10-CM

## 2022-02-12 NOTE — Telephone Encounter (Signed)
Refill for Anastrozole approved.  Per last ovn, patient is tolerating and is to continue on therapy.

## 2022-02-25 ENCOUNTER — Other Ambulatory Visit (HOSPITAL_COMMUNITY)
Admission: RE | Admit: 2022-02-25 | Discharge: 2022-02-25 | Disposition: A | Discharge status: Home / Self Care | Payer: Medicare Other | Attending: Nephrology | Admitting: Nephrology

## 2022-02-25 DIAGNOSIS — D638 Anemia in other chronic diseases classified elsewhere: Secondary | ICD-10-CM | POA: Diagnosis not present

## 2022-02-25 DIAGNOSIS — K760 Fatty (change of) liver, not elsewhere classified: Secondary | ICD-10-CM | POA: Diagnosis not present

## 2022-02-25 DIAGNOSIS — E1122 Type 2 diabetes mellitus with diabetic chronic kidney disease: Secondary | ICD-10-CM | POA: Insufficient documentation

## 2022-02-25 DIAGNOSIS — I129 Hypertensive chronic kidney disease with stage 1 through stage 4 chronic kidney disease, or unspecified chronic kidney disease: Secondary | ICD-10-CM | POA: Insufficient documentation

## 2022-02-25 DIAGNOSIS — R778 Other specified abnormalities of plasma proteins: Secondary | ICD-10-CM | POA: Diagnosis not present

## 2022-02-25 DIAGNOSIS — Z6831 Body mass index (BMI) 31.0-31.9, adult: Secondary | ICD-10-CM | POA: Diagnosis not present

## 2022-02-25 DIAGNOSIS — N189 Chronic kidney disease, unspecified: Secondary | ICD-10-CM | POA: Diagnosis not present

## 2022-02-25 DIAGNOSIS — I5032 Chronic diastolic (congestive) heart failure: Secondary | ICD-10-CM | POA: Diagnosis not present

## 2022-02-25 DIAGNOSIS — I77810 Thoracic aortic ectasia: Secondary | ICD-10-CM | POA: Diagnosis not present

## 2022-02-25 LAB — CBC
HCT: 36.4 % (ref 36.0–46.0)
Hemoglobin: 11.6 g/dL — ABNORMAL LOW (ref 12.0–15.0)
MCH: 29.1 pg (ref 26.0–34.0)
MCHC: 31.9 g/dL (ref 30.0–36.0)
MCV: 91.5 fL (ref 80.0–100.0)
Platelets: 251 10*3/uL (ref 150–400)
RBC: 3.98 MIL/uL (ref 3.87–5.11)
RDW: 12.1 % (ref 11.5–15.5)
WBC: 4.4 10*3/uL (ref 4.0–10.5)
nRBC: 0 % (ref 0.0–0.2)

## 2022-02-25 LAB — RENAL FUNCTION PANEL
Albumin: 3.9 g/dL (ref 3.5–5.0)
Anion gap: 8 (ref 5–15)
BUN: 15 mg/dL (ref 8–23)
CO2: 28 mmol/L (ref 22–32)
Calcium: 9.7 mg/dL (ref 8.9–10.3)
Chloride: 105 mmol/L (ref 98–111)
Creatinine, Ser: 1.19 mg/dL — ABNORMAL HIGH (ref 0.44–1.00)
GFR, Estimated: 46 mL/min — ABNORMAL LOW (ref >60)
Glucose, Bld: 115 mg/dL — ABNORMAL HIGH (ref 70–99)
Phosphorus: 3.2 mg/dL (ref 2.5–4.6)
Potassium: 4.5 mmol/L (ref 3.5–5.1)
Sodium: 141 mmol/L (ref 135–145)

## 2022-02-26 LAB — MICROALBUMIN / CREATININE URINE RATIO
Creatinine, Urine: 101 mg/dL
Microalb Creat Ratio: 15 mg/g creat (ref 0–29)
Microalb, Ur: 14.9 ug/mL — ABNORMAL HIGH

## 2022-04-20 ENCOUNTER — Other Ambulatory Visit (HOSPITAL_COMMUNITY): Payer: Medicare Other

## 2022-05-01 ENCOUNTER — Ambulatory Visit (INDEPENDENT_AMBULATORY_CARE_PROVIDER_SITE_OTHER): Payer: Medicare Other

## 2022-05-01 ENCOUNTER — Ambulatory Visit
Admission: EM | Admit: 2022-05-01 | Discharge: 2022-05-01 | Disposition: A | Discharge status: Home / Self Care | Payer: Medicare Other | Attending: Nurse Practitioner | Admitting: Nurse Practitioner

## 2022-05-01 DIAGNOSIS — S8992XA Unspecified injury of left lower leg, initial encounter: Secondary | ICD-10-CM | POA: Diagnosis not present

## 2022-05-01 DIAGNOSIS — M25562 Pain in left knee: Secondary | ICD-10-CM

## 2022-05-01 MED ORDER — DICLOFENAC SODIUM 1 % EX GEL: 4.0000 g | Freq: Four times a day (QID) | CUTANEOUS | 0 refills | Status: AC

## 2022-05-01 NOTE — Discharge Instructions (Addendum)
-   X-ray today shows arthritis - Please use Tylenol 500 mg every 6 hours as needed for pain - You can also use the topical Voltaren gel to the left knee for pain if the Tylenol is not helpful - Recommend starting Physical Therapy for the knee pain

## 2022-05-01 NOTE — ED Provider Notes (Signed)
[facility]    CSN: [id number] Arrival date & time: 05/01/22  1158      History   Chief Complaint Chief Complaint  Patient presents with   Knee Pain    HPI [name] is a 81 y.o. female.   Patient presents for left knee pain that began about 1 week ago.  She denies any recent accident, fall, trauma, or injury to the knee.  Reports she first noticed the pain when she got out of bed 1 morning and was trying to walk.  Reports the pain is around the entire knee joint.  Pain is present at rest, worse with any walking or movement.  Denies weakness, falls since the pain started, sensation of giving way, locking, popping, bruising, redness, numbness or tingling going down her leg or in her toes, and fevers, nausea/vomiting.  Does endorse swelling diffusely over the knee joint.  Has tried red alcohol without relief.    Past Medical History:  Diagnosis Date   Breast cancer ([place])    CKD (chronic kidney disease) stage 2, GFR 60-89 ml/min    Essential hypertension    Hyperlipidemia    Type 2 diabetes mellitus ([place])    diet controlled    Patient Active Problem List   Diagnosis Date Noted   Post-menopausal 12/07/2016   Malignant neoplasm of upper-inner quadrant of right female breast ([place])    Chest pain, rule out acute myocardial infarction 06/03/2014   Chest pain 06/03/2014   Essential hypertension 06/03/2014   Hyperglycemia 06/03/2014   Hyponatremia 06/03/2014    Past Surgical History:  Procedure Laterality Date   ABDOMINAL HYSTERECTOMY     BREAST BIOPSY Left    benign   CATARACT EXTRACTION W/PHACO Right 03/14/2021   Procedure: CATARACT EXTRACTION PHACO AND INTRAOCULAR LENS PLACEMENT RIGHT EYE;  Surgeon: Baruch Goldmann, MD;  Location: [facility];  Service: Ophthalmology;  Laterality: Right;  right CDE=14.05   EYE SURGERY Left    cataract removal   PARTIAL MASTECTOMY WITH NEEDLE LOCALIZATION AND AXILLARY SENTINEL LYMPH NODE BX Right 10/28/2016   Procedure:  PARTIAL MASTECTOMY WITH NEEDLE LOCALIZATION AND AXILLARY SENTINEL LYMPH NODE BX;  Surgeon: Aviva Signs, MD;  Location: [facility];  Service: General;  Laterality: Right;    OB History     Gravida  3   Para  3   Term  3   Preterm      AB      Living         SAB      IAB      Ectopic      Multiple      Live Births               Home Medications    Prior to Admission medications   Medication Sig Start Date End Date Taking? Authorizing Provider  diclofenac Sodium (VOLTAREN) 1 % GEL Apply 4 g topically 4 (four) times daily. 05/01/22  Yes Noemi Chapel A, NP  amLODipine (NORVASC) 10 MG tablet Take 10 mg by mouth daily. 10/02/21   [provider]  anastrozole (ARIMIDEX) 1 MG tablet TAKE 1 TABLET(1 MG) BY MOUTH DAILY 02/12/22   Derek Jack, MD  atorvastatin (LIPITOR) 40 MG tablet Take 40 mg by mouth daily. 10/02/21   [provider]  losartan (COZAAR) 50 MG tablet Take 50 mg by mouth 2 (two) times daily. 04/12/20   [provider]  magnesium gluconate (MAGONATE) 500 MG tablet Take 500 mg by mouth daily.  [provider]  Menthol-Camphor 11-11 % CREA Apply 1 application topically 3 (three) times daily as needed (for pain (back)).     [provider]  Polyethyl Glycol-Propyl Glycol (SYSTANE) 0.4-0.3 % SOLN Place 1 drop into both eyes daily.    [provider]    Family History Family History  Problem Relation Age of Onset   Hypertension Mother    Hypertension Father    Diabetes Mellitus II Sister    Diabetes Mellitus II Brother    Diabetes Mellitus II Brother     Social History Social History   Tobacco Use   Smoking status: Never   Smokeless tobacco: Never  Vaping Use   Vaping Use: Never used  Substance Use Topics   Alcohol use: No   Drug use: No     Allergies   Patient has no known allergies.   Review of Systems Review of Systems Per HPI  Physical Exam Triage Vital Signs ED Triage  Vitals  Enc Vitals Group     BP 05/01/22 1214 (!) 174/75     Pulse Rate 05/01/22 1214 77     Resp 05/01/22 1214 16     Temp 05/01/22 1214 98 F (36.7 C)     Temp Source 05/01/22 1214 Oral     SpO2 05/01/22 1214 97 %     Weight --      Height --      Head Circumference --      Peak Flow --      Pain Score 05/01/22 1212 10     Pain Loc --      Pain Edu? --      Excl. in [place]? --    No data found.  Updated Vital Signs BP (!) 174/75 (BP Location: Right Arm)   Pulse 77   Temp 98 F (36.7 C) (Oral)   Resp 16   LMP 12/05/2019   SpO2 97%   Visual Acuity Right Eye Distance:   Left Eye Distance:   Bilateral Distance:    Right Eye Near:   Left Eye Near:    Bilateral Near:     Physical Exam Vitals and nursing note reviewed.  Constitutional:      General: She is not in acute distress.    Appearance: Normal appearance. She is not toxic-appearing.  HENT:     Mouth/Throat:     Mouth: Mucous membranes are moist.     Pharynx: Oropharynx is clear.  Pulmonary:     Effort: Pulmonary effort is normal. No respiratory distress.  Musculoskeletal:     Right knee: Normal.     Left knee: Swelling, effusion and bony tenderness present. No erythema or ecchymosis. Normal range of motion. No tenderness. Normal alignment. Normal pulse.     Right lower leg: Normal.     Left lower leg: No swelling, tenderness or bony tenderness. No edema.     Left ankle: Normal. Normal pulse.     Left Achilles Tendon: Normal.     Comments: Inspection: Mild swelling diffusely to the left knee joint; no obvious deformity or redness Palpation: Left knee joint diffusely tender to palpation; no obvious deformities palpated ROM: Full ROM to left knee, ankle, foot Strength: 5/5 left lower extremity Neurovascular: neurovascularly intact in left and right lower extremity Laxity testing difficult to perform secondary to pain  Skin:    General: Skin is warm and dry.     Capillary Refill: Capillary refill takes less  than 2 seconds.  Coloration: Skin is not jaundiced or pale.     Findings: No erythema.  Neurological:     Mental Status: She is alert and oriented to person, place, and time.     Gait: Gait abnormal (mild antalgic gait).  Psychiatric:        Behavior: Behavior is cooperative.      UC Treatments / Results  Labs (all labs ordered are listed, but only abnormal results are displayed) Labs Reviewed - No data to display  EKG   Radiology DG Knee Complete 4 Views Left  Result Date: 05/01/2022 CLINICAL DATA:  Knee pain for 1 week without trauma EXAM: LEFT KNEE - COMPLETE 4+ VIEW COMPARISON:  None Available. FINDINGS: No acute fracture or dislocation. 3 compartment osteoarthritis, moderate to severe in the medial and patellofemoral compartments. Increased density about the origin of the lateral collateral ligament may relate to remote trauma or far lateral loose bodies. Small intra-articular loose bodies identified posteriorly. No joint effusion. IMPRESSION: Degenerative change, without acute osseous finding. Electronically Signed   By: Abigail Miyamoto M.D.   On: 05/01/2022 12:57    Procedures Procedures (including critical care time)  Medications Ordered in UC Medications - No data to display  Initial Impression / Assessment and Plan / UC Course  I have reviewed the triage vital signs and the nursing notes.  Pertinent labs & imaging results that were available during my care of the patient were reviewed by me and considered in my medical decision making (see chart for details).    Patient is a very pleasant, well-appearing 81 year old female presenting for left knee pain.  In triage, she is slightly hypertensive, otherwise vital signs are stable.  She is not tachypneic, tachycardic, febrile, and is oxygenating well on room air.  Suspect mildly elevated blood pressure secondary to pain today.  Patient does not have any symptoms of high blood pressure today.  Checks her blood pressure at  home and is normally lower.  X-ray today shows degenerative change without acute osseous finding.  Suspect osteoarthritis.  Discussed results with patient.  Recommended Tylenol 500 mg every 6 hours as needed for pain.  Also discussed use of Voltaren gel sparingly as needed for pain.  Recommended physical therapy for joint rehabilitation and close follow-up with primary care provider if symptoms do not improve with this treatment. Final Clinical Impressions(s) / UC Diagnoses   Final diagnoses:  Acute pain of left knee     Discharge Instructions      - X-ray today shows arthritis - Please use Tylenol 500 mg every 6 hours as needed for pain - You can also use the topical Voltaren gel to the left knee for pain if the Tylenol is not helpful - Recommend starting Physical Therapy for the knee pain      ED Prescriptions     Medication Sig Dispense Auth. Provider   diclofenac Sodium (VOLTAREN) 1 % GEL Apply 4 g topically 4 (four) times daily. 50 g Eulogio Bear, NP      PDMP not reviewed this encounter.   Eulogio Bear, NP 05/01/22 1308

## 2022-05-01 NOTE — ED Triage Notes (Signed)
Pt reports swelling and pain in left knee x 1 week.  Pain is worse when walking and trying to stand up. Pt denies any injury.

## 2022-05-13 DIAGNOSIS — H35033 Hypertensive retinopathy, bilateral: Secondary | ICD-10-CM | POA: Diagnosis not present

## 2022-05-13 DIAGNOSIS — E119 Type 2 diabetes mellitus without complications: Secondary | ICD-10-CM | POA: Diagnosis not present

## 2022-09-04 DIAGNOSIS — I10 Essential (primary) hypertension: Secondary | ICD-10-CM | POA: Diagnosis not present

## 2022-09-09 DIAGNOSIS — Z0001 Encounter for general adult medical examination with abnormal findings: Secondary | ICD-10-CM | POA: Diagnosis not present

## 2022-09-09 DIAGNOSIS — I10 Essential (primary) hypertension: Secondary | ICD-10-CM | POA: Diagnosis not present

## 2022-09-09 DIAGNOSIS — M170 Bilateral primary osteoarthritis of knee: Secondary | ICD-10-CM | POA: Diagnosis not present

## 2022-09-09 DIAGNOSIS — Z79899 Other long term (current) drug therapy: Secondary | ICD-10-CM | POA: Diagnosis not present

## 2022-09-09 DIAGNOSIS — Z0000 Encounter for general adult medical examination without abnormal findings: Secondary | ICD-10-CM | POA: Diagnosis not present

## 2022-09-23 DIAGNOSIS — Z79899 Other long term (current) drug therapy: Secondary | ICD-10-CM | POA: Diagnosis not present

## 2022-09-23 DIAGNOSIS — I10 Essential (primary) hypertension: Secondary | ICD-10-CM | POA: Diagnosis not present

## 2022-09-23 DIAGNOSIS — R519 Headache, unspecified: Secondary | ICD-10-CM | POA: Diagnosis not present

## 2022-10-06 DIAGNOSIS — M170 Bilateral primary osteoarthritis of knee: Secondary | ICD-10-CM | POA: Diagnosis not present

## 2022-10-12 ENCOUNTER — Other Ambulatory Visit (HOSPITAL_COMMUNITY): Payer: Self-pay | Admitting: Physician Assistant

## 2022-10-12 DIAGNOSIS — Z1231 Encounter for screening mammogram for malignant neoplasm of breast: Secondary | ICD-10-CM

## 2022-10-21 DIAGNOSIS — M7989 Other specified soft tissue disorders: Secondary | ICD-10-CM | POA: Diagnosis not present

## 2022-10-21 DIAGNOSIS — R2242 Localized swelling, mass and lump, left lower limb: Secondary | ICD-10-CM | POA: Diagnosis not present

## 2022-10-21 DIAGNOSIS — M25572 Pain in left ankle and joints of left foot: Secondary | ICD-10-CM | POA: Diagnosis not present

## 2022-11-10 ENCOUNTER — Other Ambulatory Visit (HOSPITAL_COMMUNITY)
Admission: RE | Admit: 2022-11-10 | Discharge: 2022-11-10 | Disposition: A | Discharge status: Home / Self Care | Payer: Medicare Other | Source: Ambulatory Visit | Attending: Nephrology | Admitting: Nephrology

## 2022-11-10 DIAGNOSIS — E1122 Type 2 diabetes mellitus with diabetic chronic kidney disease: Secondary | ICD-10-CM | POA: Insufficient documentation

## 2022-11-10 DIAGNOSIS — I129 Hypertensive chronic kidney disease with stage 1 through stage 4 chronic kidney disease, or unspecified chronic kidney disease: Secondary | ICD-10-CM | POA: Diagnosis not present

## 2022-11-10 DIAGNOSIS — N189 Chronic kidney disease, unspecified: Secondary | ICD-10-CM | POA: Insufficient documentation

## 2022-11-10 DIAGNOSIS — D638 Anemia in other chronic diseases classified elsewhere: Secondary | ICD-10-CM | POA: Diagnosis not present

## 2022-11-10 DIAGNOSIS — E875 Hyperkalemia: Secondary | ICD-10-CM | POA: Diagnosis not present

## 2022-11-10 DIAGNOSIS — I5032 Chronic diastolic (congestive) heart failure: Secondary | ICD-10-CM | POA: Diagnosis not present

## 2022-11-10 LAB — CBC
HCT: 37 % (ref 36.0–46.0)
Hemoglobin: 11.9 g/dL — ABNORMAL LOW (ref 12.0–15.0)
MCH: 29.2 pg (ref 26.0–34.0)
MCHC: 32.2 g/dL (ref 30.0–36.0)
MCV: 90.7 fL (ref 80.0–100.0)
Platelets: 251 10*3/uL (ref 150–400)
RBC: 4.08 MIL/uL (ref 3.87–5.11)
RDW: 12.5 % (ref 11.5–15.5)
WBC: 4.1 10*3/uL (ref 4.0–10.5)
nRBC: 0 % (ref 0.0–0.2)

## 2022-11-10 LAB — PROTEIN / CREATININE RATIO, URINE
Creatinine, Urine: 133 mg/dL
Protein Creatinine Ratio: 0.05 mg/mg{Cre} (ref 0.00–0.15)
Total Protein, Urine: 7 mg/dL

## 2022-11-10 LAB — RENAL FUNCTION PANEL
Albumin: 3.9 g/dL (ref 3.5–5.0)
Anion gap: 11 (ref 5–15)
BUN: 17 mg/dL (ref 8–23)
CO2: 24 mmol/L (ref 22–32)
Calcium: 9.5 mg/dL (ref 8.9–10.3)
Chloride: 105 mmol/L (ref 98–111)
Creatinine, Ser: 1.25 mg/dL — ABNORMAL HIGH (ref 0.44–1.00)
GFR, Estimated: 43 mL/min — ABNORMAL LOW (ref >60)
Glucose, Bld: 118 mg/dL — ABNORMAL HIGH (ref 70–99)
Phosphorus: 3.1 mg/dL (ref 2.5–4.6)
Potassium: 4.1 mmol/L (ref 3.5–5.1)
Sodium: 140 mmol/L (ref 135–145)

## 2022-11-11 LAB — DIFFERENTIAL
Abs Immature Granulocytes: 0.01 10*3/uL (ref 0.00–0.07)
Basophils Absolute: 0 10*3/uL (ref 0.0–0.1)
Basophils Relative: 1 %
Eosinophils Absolute: 0.1 10*3/uL (ref 0.0–0.5)
Eosinophils Relative: 3 %
Immature Granulocytes: 0 %
Lymphocytes Relative: 34 %
Lymphs Abs: 1.4 10*3/uL (ref 0.7–4.0)
Monocytes Absolute: 0.4 10*3/uL (ref 0.1–1.0)
Monocytes Relative: 10 %
Neutro Abs: 2.1 10*3/uL (ref 1.7–7.7)
Neutrophils Relative %: 52 %

## 2022-11-23 ENCOUNTER — Ambulatory Visit (HOSPITAL_COMMUNITY)
Admission: RE | Admit: 2022-11-23 | Discharge: 2022-11-23 | Disposition: A | Discharge status: Home / Self Care | Payer: Medicare Other | Source: Ambulatory Visit | Attending: Physician Assistant | Admitting: Physician Assistant

## 2022-11-23 ENCOUNTER — Encounter (HOSPITAL_COMMUNITY): Payer: Self-pay

## 2022-11-23 DIAGNOSIS — Z1231 Encounter for screening mammogram for malignant neoplasm of breast: Secondary | ICD-10-CM | POA: Insufficient documentation

## 2022-11-30 DIAGNOSIS — I5032 Chronic diastolic (congestive) heart failure: Secondary | ICD-10-CM | POA: Diagnosis not present

## 2022-11-30 DIAGNOSIS — E1122 Type 2 diabetes mellitus with diabetic chronic kidney disease: Secondary | ICD-10-CM | POA: Diagnosis not present

## 2022-11-30 DIAGNOSIS — D638 Anemia in other chronic diseases classified elsewhere: Secondary | ICD-10-CM | POA: Diagnosis not present

## 2022-11-30 DIAGNOSIS — I129 Hypertensive chronic kidney disease with stage 1 through stage 4 chronic kidney disease, or unspecified chronic kidney disease: Secondary | ICD-10-CM | POA: Diagnosis not present

## 2022-11-30 DIAGNOSIS — N189 Chronic kidney disease, unspecified: Secondary | ICD-10-CM | POA: Diagnosis not present

## 2023-01-18 ENCOUNTER — Encounter (HOSPITAL_COMMUNITY): Payer: Self-pay

## 2023-01-18 ENCOUNTER — Emergency Department (HOSPITAL_COMMUNITY)
Admission: EM | Admit: 2023-01-18 | Discharge: 2023-01-19 | Disposition: A | Discharge status: Home / Self Care | Payer: Medicare Other | Attending: Emergency Medicine | Admitting: Emergency Medicine

## 2023-01-18 ENCOUNTER — Other Ambulatory Visit: Payer: Self-pay

## 2023-01-18 DIAGNOSIS — Z79899 Other long term (current) drug therapy: Secondary | ICD-10-CM | POA: Diagnosis not present

## 2023-01-18 DIAGNOSIS — M25561 Pain in right knee: Secondary | ICD-10-CM | POA: Diagnosis not present

## 2023-01-18 DIAGNOSIS — M1711 Unilateral primary osteoarthritis, right knee: Secondary | ICD-10-CM | POA: Insufficient documentation

## 2023-01-18 DIAGNOSIS — M1712 Unilateral primary osteoarthritis, left knee: Secondary | ICD-10-CM | POA: Diagnosis not present

## 2023-01-18 DIAGNOSIS — R531 Weakness: Secondary | ICD-10-CM | POA: Diagnosis not present

## 2023-01-18 DIAGNOSIS — Z743 Need for continuous supervision: Secondary | ICD-10-CM | POA: Diagnosis not present

## 2023-01-18 DIAGNOSIS — I499 Cardiac arrhythmia, unspecified: Secondary | ICD-10-CM | POA: Diagnosis not present

## 2023-01-18 DIAGNOSIS — R6889 Other general symptoms and signs: Secondary | ICD-10-CM | POA: Diagnosis not present

## 2023-01-18 DIAGNOSIS — M170 Bilateral primary osteoarthritis of knee: Secondary | ICD-10-CM

## 2023-01-18 DIAGNOSIS — M25562 Pain in left knee: Secondary | ICD-10-CM | POA: Diagnosis not present

## 2023-01-18 DIAGNOSIS — R609 Edema, unspecified: Secondary | ICD-10-CM | POA: Diagnosis not present

## 2023-01-18 NOTE — ED Triage Notes (Signed)
[facility] from home. Cc of chronic arthritis pain in each knee. Hurts to walk.

## 2023-01-19 ENCOUNTER — Emergency Department (HOSPITAL_COMMUNITY): Payer: Medicare Other

## 2023-01-19 DIAGNOSIS — M25562 Pain in left knee: Secondary | ICD-10-CM | POA: Diagnosis not present

## 2023-01-19 DIAGNOSIS — M25561 Pain in right knee: Secondary | ICD-10-CM | POA: Diagnosis not present

## 2023-01-19 MED ORDER — HYDROCODONE-ACETAMINOPHEN 5-325 MG PO TABS: 1.0000 | ORAL_TABLET | Freq: Four times a day (QID) | ORAL | 0 refills | Status: DC | PRN

## 2023-01-19 MED ORDER — MELOXICAM 7.5 MG PO TABS: 7.5000 mg | ORAL_TABLET | Freq: Every day | ORAL | 0 refills | Status: AC

## 2023-01-19 MED ORDER — CLONIDINE HCL 0.1 MG PO TABS
0.1000 mg | ORAL_TABLET | Freq: Once | ORAL | Status: AC
  Administered 2023-01-19: 0.1 mg via ORAL
  Filled 2023-01-19: qty 1

## 2023-01-19 NOTE — ED Provider Notes (Signed)
[place] EMERGENCY DEPARTMENT AT [facility] Provider Note   CSN: [id number] Arrival date & time: 01/18/23  2217     History  Chief Complaint  Patient presents with   Knee Pain    [name] is a 82 y.o. female.  Patient presents to the emergency department for evaluation of bilateral knee pain, left greater than right.  Patient reports a history of arthritis.  The known injury.  Pain worse today.       Home Medications Prior to Admission medications   Medication Sig Start Date End Date Taking? Authorizing Provider  HYDROcodone-acetaminophen (NORCO/VICODIN) 5-325 MG tablet Take 1 tablet by mouth every 6 (six) hours as needed for severe pain. 01/19/23  Yes Patricia Fargo, Canary Brim, MD  meloxicam (MOBIC) 7.5 MG tablet Take 1 tablet (7.5 mg total) by mouth daily. 01/19/23  Yes Phillis Thackeray, Canary Brim, MD  amLODipine (NORVASC) 10 MG tablet Take 10 mg by mouth daily. 10/02/21   [provider]  anastrozole (ARIMIDEX) 1 MG tablet TAKE 1 TABLET(1 MG) BY MOUTH DAILY 02/12/22   Doreatha Massed, MD  atorvastatin (LIPITOR) 40 MG tablet Take 40 mg by mouth daily. 10/02/21   [provider]  diclofenac Sodium (VOLTAREN) 1 % GEL Apply 4 g topically 4 (four) times daily. 05/01/22   Valentino Nose, NP  losartan (COZAAR) 50 MG tablet Take 50 mg by mouth 2 (two) times daily. 04/12/20   [provider]  magnesium gluconate (MAGONATE) 500 MG tablet Take 500 mg by mouth daily.    [provider]  Menthol-Camphor 11-11 % CREA Apply 1 application topically 3 (three) times daily as needed (for pain (back)).     [provider]  Polyethyl Glycol-Propyl Glycol (SYSTANE) 0.4-0.3 % SOLN Place 1 drop into both eyes daily.    [provider]      Allergies    Patient has no known allergies.    Review of Systems   Review of Systems  Physical Exam Updated Vital Signs BP (!) 187/92   Pulse 93   Temp 98.3 F (36.8 C) (Oral)   Resp  17   Ht 5\' 3"  (1.6 m)   Wt 81 kg   LMP 12/05/2019   SpO2 98%   BMI 31.63 kg/m  Physical Exam Vitals and nursing note reviewed.  Constitutional:      General: She is not in acute distress.    Appearance: She is well-developed.  HENT:     Head: Normocephalic and atraumatic.     Mouth/Throat:     Mouth: Mucous membranes are moist.  Eyes:     General: Vision grossly intact. Gaze aligned appropriately.     Extraocular Movements: Extraocular movements intact.     Conjunctiva/sclera: Conjunctivae normal.  Cardiovascular:     Rate and Rhythm: Normal rate and regular rhythm.     Pulses: Normal pulses.     Heart sounds: Normal heart sounds, S1 normal and S2 normal. No murmur heard.    No friction rub. No gallop.  Pulmonary:     Effort: Pulmonary effort is normal. No respiratory distress.     Breath sounds: Normal breath sounds.  Abdominal:     General: Bowel sounds are normal.     Palpations: Abdomen is soft.     Tenderness: There is no abdominal tenderness. There is no guarding or rebound.     Hernia: No hernia is present.  Musculoskeletal:        General: No swelling.  Cervical back: Full passive range of motion without pain, normal range of motion and neck supple. No spinous process tenderness or muscular tenderness. Normal range of motion.     Right knee: No swelling, deformity, effusion or erythema. Normal range of motion.     Left knee: No swelling, deformity, effusion or erythema. Normal range of motion. Tenderness present.     Right lower leg: No edema.     Left lower leg: No edema.  Skin:    General: Skin is warm and dry.     Capillary Refill: Capillary refill takes less than 2 seconds.     Findings: No ecchymosis, erythema, rash or wound.  Neurological:     General: No focal deficit present.     Mental Status: She is alert and oriented to person, place, and time.     GCS: GCS eye subscore is 4. GCS verbal subscore is 5. GCS motor subscore is 6.     Cranial Nerves:  Cranial nerves 2-12 are intact.     Sensory: Sensation is intact.     Motor: Motor function is intact.     Coordination: Coordination is intact.  Psychiatric:        Attention and Perception: Attention normal.        Mood and Affect: Mood normal.        Speech: Speech normal.        Behavior: Behavior normal.     ED Results / Procedures / Treatments   Labs (all labs ordered are listed, but only abnormal results are displayed) Labs Reviewed - No data to display  EKG None  Radiology DG Knee Complete 4 Views Right  Result Date: 01/19/2023 CLINICAL DATA:  Bilateral knee pain due to arthritis. EXAM: RIGHT KNEE - COMPLETE 4+ VIEW COMPARISON:  None Available. FINDINGS: No acute fracture or dislocation. Moderate-to-severe tricompartmental degenerative changes are present. There is a moderate suprapatellar joint effusion. Multiple calcifications are present about the knee, suggesting loose bodies. Soft tissues are within normal limits. IMPRESSION: 1. No acute fracture or dislocation. 2. Moderate-to-severe tricompartmental degenerative changes. 3. Moderate suprapatellar joint effusion. 4. Calcifications at the knee joint suggesting loose bodies. Electronically Signed   By: Thornell Sartorius M.D.   On: 01/19/2023 02:18   DG Knee Complete 4 Views Left  Result Date: 01/19/2023 CLINICAL DATA:  Left knee pain. EXAM: LEFT KNEE - COMPLETE 4+ VIEW COMPARISON:  Left knee radiograph dated 05/01/2022. FINDINGS: There is no acute fracture or dislocation. The bones are osteopenic. Moderate arthritic changes of the knee with tricompartmental narrowing and spurring. There is a small suprapatellar effusion. The soft tissues are unremarkable. IMPRESSION: 1. No acute fracture or dislocation. 2. Moderate arthritic changes and small suprapatellar effusion. Electronically Signed   By: Elgie Collard M.D.   On: 01/19/2023 02:17    Procedures Procedures    Medications Ordered in ED Medications  cloNIDine (CATAPRES)  tablet 0.1 mg (0.1 mg Oral Given 01/19/23 0206)    ED Course/ Medical Decision Making/ A&P                             Medical Decision Making Amount and/or Complexity of Data Reviewed Radiology: ordered.  Risk Prescription drug management.   Differential diagnosis considered includes, but not limited to: Osteoarthritis, rheumatoid arthritis, septic arthritis, trauma  Patient presents with bilateral knee pain.  Patient with known arthritis.  X-ray shows arthritis in the right knee with severe tricompartmental arthritis in  the left knee.  Her left knee hurts more than the right.  Patient has preserved range of motion.  No erythema or warmth.  No concern for septic arthritis.  Presentation consistent with worsening of her osteoarthritis.  Patient noted to have asymptomatic hypertension.  She has a history of hypertension, did not take meds today.  Given clonidine with improvement, she will be discharged with analgesia.  Restart her hypertension medications in the morning, follow-up with primary care.        Final Clinical Impression(s) / ED Diagnoses Final diagnoses:  Osteoarthritis of both knees, unspecified osteoarthritis type    Rx / DC Orders ED Discharge Orders          Ordered    meloxicam (MOBIC) 7.5 MG tablet  Daily        01/19/23 0228    HYDROcodone-acetaminophen (NORCO/VICODIN) 5-325 MG tablet  Every 6 hours PRN        01/19/23 0229              Gilda Crease, MD 01/19/23 0230

## 2023-01-20 MED FILL — Hydrocodone-Acetaminophen Tab 5-325 MG: ORAL | Qty: 6 | Status: AC

## 2023-01-21 DIAGNOSIS — M170 Bilateral primary osteoarthritis of knee: Secondary | ICD-10-CM | POA: Diagnosis not present

## 2023-01-21 DIAGNOSIS — Z713 Dietary counseling and surveillance: Secondary | ICD-10-CM | POA: Diagnosis not present

## 2023-01-21 DIAGNOSIS — Z79899 Other long term (current) drug therapy: Secondary | ICD-10-CM | POA: Diagnosis not present

## 2023-01-21 DIAGNOSIS — I10 Essential (primary) hypertension: Secondary | ICD-10-CM | POA: Diagnosis not present

## 2023-01-26 ENCOUNTER — Encounter: Payer: Self-pay | Admitting: Orthopedic Surgery

## 2023-01-26 ENCOUNTER — Ambulatory Visit: Payer: Medicare Other | Admitting: Orthopedic Surgery

## 2023-01-26 VITALS — BP 161/87 | HR 66

## 2023-01-26 DIAGNOSIS — M1712 Unilateral primary osteoarthritis, left knee: Secondary | ICD-10-CM | POA: Diagnosis not present

## 2023-01-26 DIAGNOSIS — S93402A Sprain of unspecified ligament of left ankle, initial encounter: Secondary | ICD-10-CM

## 2023-01-26 NOTE — Patient Instructions (Addendum)
Instructions Following Joint Injections ° °In clinic today, you received an injection in one of your joints (sometimes more than one).  Occasionally, you can have some pain at the injection site, this is normal.  You can place ice at the injection site, or take over-the-counter medications such as Tylenol (acetaminophen) or Advil (ibuprofen).  Please follow all directions listed on the bottle. ° °If your joint (knee or shoulder) becomes swollen, red or very painful, please contact the clinic for additional assistance.  ° °Two medications were injected, including lidocaine and a steroid (often referred to as cortisone).  Lidocaine is effective almost immediately but wears off quickly.  However, the steroid can take a few days to improve your symptoms.  In some cases, it can make your pain worse for a couple of days.  Do not be concerned if this happens as it is common.  You can apply ice or take some over-the-counter medications as needed. ° ° ° °Instructions ° °1.  You have sustained an ankle sprain, or similar exercises that can be treated as an ankle sprain.  **These exercises can also be used as part of recovery from an ankle fracture.  °2.  I encourage you to stay on your feet and gradually remove your walking boot.   °3.  Below are some exercises that you can complete on your own to improve your symptoms.  °4.  As an alternative, you can search for ankle sprain exercises online, and can see some demonstrations on YouTube  °5.  If you are having difficulty with these exercises, we can also prescribe formal physical therapy ° °Ankle Exercises °Ask your health care provider which exercises are safe for you. Do exercises exactly as told by your health care provider and adjust them as directed. It is normal to feel mild stretching, pulling, tightness, or mild discomfort as you do these exercises. Stop right away if you feel sudden pain or your pain gets worse. Do not begin these exercises until told by your health  care provider. ° °Stretching and range-of-motion exercises °These exercises warm up your muscles and joints and improve the movement and flexibility of your ankle. These exercises may also help to relieve pain. ° °Dorsiflexion/plantar flexion ° °Sit with your L knee straight or bent. Do not rest your foot on anything. °Flex your left ankle to tilt the top of your foot toward your shin. This is called dorsiflexion. °Hold this position for 5 seconds. °Point your toes downward to tilt the top of your foot away from your shin. This is called plantar flexion. °Hold this position for 5 seconds. °Repeat 10 times. Complete this exercise 2-3 times a day.  As tolerated ° °Ankle alphabet ° °Sit with your L foot supported at your lower leg. °Do not rest your foot on anything. °Make sure your foot has room to move freely. °Think of your L foot as a paintbrush: °Move your foot to trace each letter of the alphabet in the air. Keep your hip and knee still while you trace the letters. Trace every letter from A to Z. °Make the letters as large as you can without causing or increasing any discomfort. ° °Repeat 2-3 times. Complete this exercise 2-3 times a day. ° ° °Strengthening exercises °These exercises build strength and endurance in your ankle. Endurance is the ability to use your muscles for a long time, even after they get tired. °Dorsiflexors °These are muscles that lift your foot up. °Secure a rubber exercise band or tube   to an object, such as a table leg, that will stay still when the band is pulled. Secure the other end around your L foot. °Sit on the floor, facing the object with your L leg extended. The band or tube should be slightly tense when your foot is relaxed. °Slowly flex your L ankle and toes to bring your foot toward your shin. °Hold this position for 5 seconds. °Slowly return your foot to the starting position, controlling the band as you do that. °Repeat 10 times. Complete this exercise 2-3 times a  day. ° °Plantar flexors °These are muscles that push your foot down. °Sit on the floor with your L leg extended. °Loop a rubber exercise band or tube around the ball of your L foot. The ball of your foot is on the walking surface, right under your toes. The band or tube should be slightly tense when your foot is relaxed. °Slowly point your toes downward, pushing them away from you. °Hold this position for 5 seconds. °Slowly release the tension in the band or tube, controlling smoothly until your foot is back in the starting position. °Repeat 10 times. Complete this exercise 2-3 times a day. ° °Towel curls ° °Sit in a chair on a non-carpeted surface, and put your feet on the floor. °Place a towel in front of your feet. °Keeping your heel on the floor, put your L foot on the towel. °Pull the towel toward you by grabbing the towel with your toes and curling them under. Keep your heel on the floor. °Let your toes relax. °Grab the towel again. Keep pulling the towel until it is completely underneath your foot. °Repeat 10 times. Complete this exercise 2-3 times a day. ° °Standing plantar flexion °This is an exercise in which you use your toes to lift your body's weight while standing. °Stand with your feet shoulder-width apart. °Keep your weight spread evenly over the width of your feet while you rise up on your toes. Use a wall or table to steady yourself if needed, but try not to use it for support. °If this exercise is too easy, try these options: °Shift your weight toward your L leg until you feel challenged. °If told by your health care provider, lift your uninjured leg off the floor. °Hold this position for 5 seconds. °Repeat 10 times. Complete this exercise 2-3 times a day. ° °Tandem walking °Stand with one foot directly in front of the other. °Slowly raise your back foot up, lifting your heel before your toes, and place it directly in front of your other foot. °Continue to walk in this heel-to-toe way. Have a  countertop or wall nearby to use if needed to keep your balance, but try not to hold onto anything for support. ° °Repeat 10 times. Complete this exercise 2-3 times a day. °   °

## 2023-01-26 NOTE — Progress Notes (Signed)
New Patient Visit  Assessment: [name] is a 82 y.o. female with the following: 1. Arthritis of knee, left 2. Sprain of left ankle, unspecified ligament, initial encounter  Plan: [name] fell a couple of weeks ago, and has had persistent left knee pain, as well as left ankle pain and swelling.  On radiographs, she has moderate to severe degenerative changes within the left knee.  This most likely represents an arthritic flare.  We discussed proceeding with an injection, this was completed in clinic today without issues.  She also has some swelling, but no bruising of the left ankle.  She may have twisted the ankle, when she fell.  She does have some pain with inversion of the ankle.  Encouraged her to ambulate carefully.  Wear supportive shoes.  Use ice and continue to take meloxicam as needed.  I have also provided her with simple exercises for the left ankle.  I will see her back as needed.  Procedure note injection Left knee joint   Verbal consent was obtained to inject the left knee joint  Timeout was completed to confirm the site of injection.  The skin was prepped with alcohol and ethyl chloride was sprayed at the injection site.  A 21-gauge needle was used to inject 40 mg of Depo-Medrol and 1% lidocaine (3 cc) into the left knee using an anterolateral approach.  There were no complications. A sterile bandage was applied.     Follow-up: Return if symptoms worsen or fail to improve.  Subjective:  Chief Complaint  Patient presents with   Knee Pain    Bil knee pain L > R for 2 wks no injuries getting worse. Pt also having pain and swelling into the L ankle     History of Present Illness: [name] is a 82 y.o. female who presents for evaluation of left knee pain.  Approximately 2 weeks ago, she fell, landing directly on the left knee.  Since then, she has had pain in both knees, although the left is much worse than the right.  She did have a lot of  swelling, but this has improved.  She also has swelling of the left ankle.  She went to the emergency department, her x-rays demonstrated arthritis.  She was provided with prednisone, hydrocodone, meloxicam.  She continues to take meloxicam.  The swelling has improved.  She continues to have pain in the medial aspect of the left knee.  She has not had this pain previously.  No prior injections in the knee.  She uses a walker to assist with ambulation.  Review of Systems: No fevers or chills No numbness or tingling No chest pain No shortness of breath No bowel or bladder dysfunction No GI distress No headaches   Medical History:  Past Medical History:  Diagnosis Date   Breast cancer (HCC)    CKD (chronic kidney disease) stage 2, GFR 60-89 ml/min    Essential hypertension    Hyperlipidemia    Type 2 diabetes mellitus (HCC)    diet controlled    Past Surgical History:  Procedure Laterality Date   ABDOMINAL HYSTERECTOMY     BREAST BIOPSY Left    benign   CATARACT EXTRACTION W/PHACO Right 03/14/2021   Procedure: CATARACT EXTRACTION PHACO AND INTRAOCULAR LENS PLACEMENT RIGHT EYE;  Surgeon: Fabio Pierce, MD;  Location: [facility];  Service: Ophthalmology;  Laterality: Right;  right CDE=14.05   EYE SURGERY Left    cataract removal   PARTIAL MASTECTOMY WITH NEEDLE  LOCALIZATION AND AXILLARY SENTINEL LYMPH NODE BX Right 10/28/2016   Procedure: PARTIAL MASTECTOMY WITH NEEDLE LOCALIZATION AND AXILLARY SENTINEL LYMPH NODE BX;  Surgeon: Franky Macho, MD;  Location: [facility];  Service: General;  Laterality: Right;    Family History  Problem Relation Age of Onset   Hypertension Mother    Hypertension Father    Diabetes Mellitus II Sister    Diabetes Mellitus II Brother    Diabetes Mellitus II Brother    Social History   Tobacco Use   Smoking status: Never   Smokeless tobacco: Never  Vaping Use   Vaping Use: Never used  Substance Use Topics   Alcohol use: No   Drug use: No    No  Known Allergies  No outpatient medications have been marked as taking for the 01/26/23 encounter (Office Visit) with Oliver Barre, MD.    Objective: BP (!) 161/87   Pulse 66   LMP 12/05/2019   Physical Exam:  General: Elderly female., Alert and oriented., No acute distress., and Seated in a wheelchair. Gait: Left sided antalgic gait.  Evaluation of left knee demonstrates a mild effusion.  Tenderness to palpation over the medial joint line.  She is able to achieve full extension.  She has pain with flexion beyond 100 degrees.  Negative Lachman.  No increased laxity varus valgus stress.  She has diffuse swelling over the left ankle and foot.  No obvious bruising.  She is tenderness palpation over the lateral ankle.  Pain is recreated with inversion of the ankle.  IMAGING: I personally reviewed images previously obtained from the ED  X-rays of both knees were obtained in the emergency department.  She has moderate to severe arthritis in both knees.  Left is worse than right.  Near complete loss of the medial joint space on the left.  Large osteophyte in the right lateral knee.   New Medications:  No orders of the defined types were placed in this encounter.     Oliver Barre, MD  01/26/2023 9:21 AM

## 2023-02-15 ENCOUNTER — Telehealth: Payer: Self-pay | Admitting: Orthopedic Surgery

## 2023-02-15 NOTE — Telephone Encounter (Signed)
[name]  Patient granddaughter called and states the patient wants to see Dr. Dallas Schimke again her left knee and foot  is in a lot of pain on a scales of 1 to 10 it is a 8.  She wants to wait and see Dr. Dallas Schimke next Tuesday.

## 2023-02-23 ENCOUNTER — Encounter: Payer: Self-pay | Admitting: Orthopedic Surgery

## 2023-02-23 ENCOUNTER — Ambulatory Visit: Payer: Medicare Other | Admitting: Orthopedic Surgery

## 2023-02-23 ENCOUNTER — Other Ambulatory Visit (INDEPENDENT_AMBULATORY_CARE_PROVIDER_SITE_OTHER): Payer: Medicare Other

## 2023-02-23 DIAGNOSIS — M25572 Pain in left ankle and joints of left foot: Secondary | ICD-10-CM

## 2023-02-23 DIAGNOSIS — M21372 Foot drop, left foot: Secondary | ICD-10-CM | POA: Diagnosis not present

## 2023-02-23 NOTE — Progress Notes (Signed)
Return patient Visit  Assessment: [name] is a 82 y.o. female with the following: Foot and ankle pain, swelling and weakness  Plan: [name] continues to have some pain and swelling in the left foot and ankle.  She did fall over a month ago, but is unclear if she sustained an injury at that time or not.  More recently, she has noticed that she has some decreased dorsiflexion of the ankle, as well as the great toe.  No other explanation for this weakness that is appreciated on physical exam.  She may have an issue in her lower back, and she does endorse some back pain, but no radiculopathy.  We could obtain nerve conduction studies to see if there is a reason for some of the weakness.  She denies any numbness or tingling.  It is possible that her diabetes is contributing to the weakness in her left ankle.  At this point, I am recommending physical therapy.  Will place referral today.  She will let me know how she is doing.   Follow-up: Return if symptoms worsen or fail to improve.  Subjective:  Chief Complaint  Patient presents with   Foot Pain    L foot pain getting worse since last visit.     History of Present Illness: [name] is a 82 y.o. female who returns for evaluation of left ankle pain.  I saw her in clinic about a month ago.  At that point, she was also having some left knee pain.  She had an injection in her left knee, and since then that has improved.  However, she continues to have issues with her left foot and ankle.  She notes some pain in the lateral aspect of the ankle.  She also has increased swelling.  She also notes that she is having some difficulty lifting her left foot.  She is walking with a cane.  No medicines.  No therapy.    Review of Systems: No fevers or chills No numbness or tingling No chest pain No shortness of breath No bowel or bladder dysfunction No GI distress No headaches   Objective: LMP 12/05/2019   Physical  Exam:  General: Elderly female., Alert and oriented., No acute distress., and Seated in a wheelchair. Gait: Left sided antalgic gait.  She has diffuse swelling over the left ankle and foot.  No obvious bruising.  She has tenderness palpation over the lateral ankle.  Pain is not worsened with inversion of the left ankle.  She has 3+/5 strength dorsiflexion of the left ankle and great toe.  Contralateral side with 5/5 strength in the right ankle and great toe.  IMAGING: I personally reviewed images previously obtained from the ED  X-rays of the left foot and ankle were obtained in clinic today.  These are nonweightbearing views.  There is soft tissue swelling laterally.  Well-maintained joint space.  Minimal degenerative changes.  No arthritis of the midfoot.  No bony lesions.  No obvious injury.  Impression: Negative left foot and ankle x-rays.   New Medications:  No orders of the defined types were placed in this encounter.     Oliver Barre, MD  02/23/2023 3:33 PM

## 2023-02-23 NOTE — Patient Instructions (Addendum)
Referral for physical therapy.  This could improve your strength, and improve your stability.  We can also consider obtaining some nerve conduction studies.

## 2023-02-25 DIAGNOSIS — I10 Essential (primary) hypertension: Secondary | ICD-10-CM | POA: Diagnosis not present

## 2023-03-03 ENCOUNTER — Other Ambulatory Visit (HOSPITAL_COMMUNITY): Payer: Self-pay | Admitting: Internal Medicine

## 2023-03-03 DIAGNOSIS — M810 Age-related osteoporosis without current pathological fracture: Secondary | ICD-10-CM

## 2023-03-03 DIAGNOSIS — R809 Proteinuria, unspecified: Secondary | ICD-10-CM | POA: Diagnosis not present

## 2023-03-03 DIAGNOSIS — E782 Mixed hyperlipidemia: Secondary | ICD-10-CM | POA: Diagnosis not present

## 2023-03-03 DIAGNOSIS — N1831 Chronic kidney disease, stage 3a: Secondary | ICD-10-CM | POA: Diagnosis not present

## 2023-03-03 DIAGNOSIS — M545 Low back pain, unspecified: Secondary | ICD-10-CM | POA: Diagnosis not present

## 2023-03-03 DIAGNOSIS — H269 Unspecified cataract: Secondary | ICD-10-CM | POA: Diagnosis not present

## 2023-03-03 DIAGNOSIS — I10 Essential (primary) hypertension: Secondary | ICD-10-CM | POA: Diagnosis not present

## 2023-03-03 DIAGNOSIS — M170 Bilateral primary osteoarthritis of knee: Secondary | ICD-10-CM | POA: Diagnosis not present

## 2023-03-03 DIAGNOSIS — Z853 Personal history of malignant neoplasm of breast: Secondary | ICD-10-CM | POA: Diagnosis not present

## 2023-03-03 DIAGNOSIS — E875 Hyperkalemia: Secondary | ICD-10-CM | POA: Diagnosis not present

## 2023-03-03 DIAGNOSIS — I129 Hypertensive chronic kidney disease with stage 1 through stage 4 chronic kidney disease, or unspecified chronic kidney disease: Secondary | ICD-10-CM | POA: Diagnosis not present

## 2023-03-03 DIAGNOSIS — E1122 Type 2 diabetes mellitus with diabetic chronic kidney disease: Secondary | ICD-10-CM | POA: Diagnosis not present

## 2023-03-17 ENCOUNTER — Ambulatory Visit (HOSPITAL_COMMUNITY)
Admission: RE | Admit: 2023-03-17 | Discharge: 2023-03-17 | Disposition: A | Discharge status: Home / Self Care | Payer: Medicare Other | Source: Ambulatory Visit | Attending: Internal Medicine | Admitting: Internal Medicine

## 2023-03-17 DIAGNOSIS — M85851 Other specified disorders of bone density and structure, right thigh: Secondary | ICD-10-CM | POA: Diagnosis not present

## 2023-03-17 DIAGNOSIS — Z780 Asymptomatic menopausal state: Secondary | ICD-10-CM | POA: Diagnosis not present

## 2023-03-17 DIAGNOSIS — M810 Age-related osteoporosis without current pathological fracture: Secondary | ICD-10-CM | POA: Insufficient documentation

## 2023-03-31 ENCOUNTER — Ambulatory Visit (HOSPITAL_COMMUNITY): Payer: Medicare Other | Attending: Orthopedic Surgery | Admitting: Physical Therapy

## 2023-03-31 DIAGNOSIS — M6281 Muscle weakness (generalized): Secondary | ICD-10-CM | POA: Diagnosis not present

## 2023-03-31 DIAGNOSIS — M25572 Pain in left ankle and joints of left foot: Secondary | ICD-10-CM | POA: Insufficient documentation

## 2023-03-31 DIAGNOSIS — M79672 Pain in left foot: Secondary | ICD-10-CM | POA: Diagnosis not present

## 2023-03-31 DIAGNOSIS — R600 Localized edema: Secondary | ICD-10-CM | POA: Insufficient documentation

## 2023-03-31 NOTE — Therapy (Signed)
OUTPATIENT PHYSICAL THERAPY LOWER EXTREMITY EVALUATION   Patient Name: [name] MRN: [id number] DOB:11-30-1940, 82 y.o., female Today's Date: 03/31/2023  END OF SESSION:  PT End of Session - 03/31/23 1732     Visit Number 1    Number of Visits 8    Date for PT Re-Evaluation 05/05/23   PT is unable to get into treatment   Authorization Type [facility] medicare    Progress Note Due on Visit 8    PT Start Time 1433    PT Stop Time 1510    PT Time Calculation (min) 37 min    Activity Tolerance Patient tolerated treatment well    Behavior During Therapy WFL for tasks assessed/performed             Past Medical History:  Diagnosis Date   Breast cancer (HCC)    CKD (chronic kidney disease) stage 2, GFR 60-89 ml/min    Essential hypertension    Hyperlipidemia    Type 2 diabetes mellitus (HCC)    diet controlled   Past Surgical History:  Procedure Laterality Date   ABDOMINAL HYSTERECTOMY     BREAST BIOPSY Left    benign   CATARACT EXTRACTION W/PHACO Right 03/14/2021   Procedure: CATARACT EXTRACTION PHACO AND INTRAOCULAR LENS PLACEMENT RIGHT EYE;  Surgeon: Fabio Pierce, MD;  Location: [facility];  Service: Ophthalmology;  Laterality: Right;  right CDE=14.05   EYE SURGERY Left    cataract removal   PARTIAL MASTECTOMY WITH NEEDLE LOCALIZATION AND AXILLARY SENTINEL LYMPH NODE BX Right 10/28/2016   Procedure: PARTIAL MASTECTOMY WITH NEEDLE LOCALIZATION AND AXILLARY SENTINEL LYMPH NODE BX;  Surgeon: Franky Macho, MD;  Location: [facility];  Service: General;  Laterality: Right;   Patient Active Problem List   Diagnosis Date Noted   Post-menopausal 12/07/2016   Malignant neoplasm of upper-inner quadrant of right female breast [facility])    Chest pain, rule out acute myocardial infarction 06/03/2014   Chest pain 06/03/2014   Essential hypertension 06/03/2014   Hyperglycemia 06/03/2014   Hyponatremia 06/03/2014    PCP: Nita Sells  REFERRING PROVIDER: Oliver Barre, MD  REFERRING  DIAG: [phone] (ICD-10-CM) - Pain in left ankle and joints of left foot  THERAPY DIAG:  Left ankle pain,  Muscle weakness    Rationale for Evaluation and Treatment: Rehabilitation  ONSET DATE: 01/19/23  SUBJECTIVE:   SUBJECTIVE STATEMENT: [name] states that she has had constant foot pain since she fell in May.  She had x-ray which were negative.  The foot has been swelling since the fall.  She has compression garments but has not used them.  The pain  increases if she is weight bearing.    PERTINENT HISTORY: OA, back pain  PAIN:  Are you having pain? Yes: NPRS scale: 8/10 Pain location: Lt foot Pain description: aching/ throbbing Aggravating factors: weight bearing Relieving factors: nothing   PRECAUTIONS: Fall   WEIGHT BEARING RESTRICTIONS: No  FALLS:  Has patient fallen in last 6 months? Yes. Number of falls 1  PLOF: Independent pt was not using a cane; she now uses a cane to walk   PATIENT GOALS: to have less pain, get the foot to where she can walk without using the can e  NEXT MD VISIT: if needed   OBJECTIVE:   DIAGNOSTIC FINDINGS: Impression: Negative left foot and ankle x-rays   EDEMA:  Figure 8: RT:57   ,Lt 59cm   PALPATION: Tender to palpation  LOWER EXTREMITY ROM:  Active ROM Right eval  Left eval  Hip flexion    Hip extension    Hip abduction    Hip adduction    Hip internal rotation    Hip external rotation    Knee flexion    Knee extension    Ankle dorsiflexion 12 10  Ankle plantarflexion 50 40  Ankle inversion 30 30  Ankle eversion 20 15   (Blank rows = not tested)  LOWER EXTREMITY MMT:  MMT Right eval Left eval  Hip flexion    Hip extension    Hip abduction    Hip adduction    Hip internal rotation    Hip external rotation    Knee flexion    Knee extension    Ankle dorsiflexion 5 4-  Ankle plantarflexion 5 3-  Ankle inversion 5 3  Ankle eversion 5 3-   (Blank rows = not tested)   GAIT: Distance walked: 100  ft Assistive device utilized: Single point cane Level of assistance: Modified independence Comments: antalgic gait    TODAY'S TREATMENT:                                                                                                                              DATE: 03/31/23; Long sit: green theraband Ankle dorsiflexion x 10 Plantarflexion x 10 Inversion  x 10 Eversion x 10     PATIENT EDUCATION:  Education details: HEP Person educated: Patient Education method: Programmer, multimedia, Facilities manager, Verbal cues, and Handouts Education comprehension: returned demonstration and needs further education  HOME EXERCISE PROGRAM: Access Code: [email or web address] URL: [email or web address] Date: 03/31/2023 Prepared by: Virgina Organ  Exercises - Ankle Dorsiflexion with Resistance  - 2 x daily - 7 x weekly - 1 sets - 10 reps - 5 hold - Ankle Eversion with Resistance  - 2 x daily - 7 x weekly - 1 sets - 10 reps - 5 hold - Ankle Inversion with Resistance  - 2 x daily - 7 x weekly - 1 sets - 10 reps - 5 hold - Ankle and Toe Plantarflexion with Resistance  - 2 x daily - 7 x weekly - 1 sets - 10 reps - 5 hold  ASSESSMENT:  CLINICAL IMPRESSION: Patient is an 82 y.o. female who was seen today for physical therapy evaluation and treatment for Lt foot pain.  The pt fell approximately 3 months ago, x rays were negative but the pain and swelling remain.  PT was ambulating without an assistive device and now uses a cane due to pain.  The pt will do what she needs to but has increased pain after being up on there foot for a limited period of time.  Evaluation demonstrates increased swelling, increased pain, decreased strength, decreased ROM and decreased balance.  [name] will benefit from skilled PT to address these issues and maximize her functioning ability. .   OBJECTIVE IMPAIRMENTS: Abnormal gait, decreased activity tolerance, decreased balance, difficulty walking, decreased ROM, decreased  strength, increased edema, and pain.  ACTIVITY LIMITATIONS: carrying, lifting, standing, stairs, and locomotion level  PARTICIPATION LIMITATIONS: cleaning, shopping, community activity, and yard work  PERSONAL FACTORS: Age and Time since onset of injury/illness/exacerbation are also affecting patient's functional outcome.   REHAB POTENTIAL: Good  CLINICAL DECISION MAKING: Stable/uncomplicated  EVALUATION COMPLEXITY: Low   GOALS: Goals reviewed with patient? No  SHORT TERM GOALS: Target date: 04/21/23-(unable to get in next week) PT to be I in  HEP in order to decrease her pain to no greater than a 5 Baseline: Goal status: INITIAL  2.  PT strength in her Lt ankle to be increased one grade to be able to walk/stand for 15 minutes without increased pain for housework  Baseline:  Goal status: INITIAL   LONG TERM GOALS: Target date: 05/05/23  PT to be I in advanced HEP in order to decrease her pain to no greater than a 2 Baseline:  Goal status: INITIAL  2.  PT strength in her Lt ankle to be increased to 4+/5 at least to be able to walk without a cane   Goal status: INITIAL  3.  PT to be able to single leg stance for 5-10" on each leg to decrease the risk of falling  Baseline:  Goal status: INITIAL     PLAN:  PT FREQUENCY: 2x/week  PT DURATION: 4 weeks  PLANNED INTERVENTIONS: Therapeutic exercises, Therapeutic activity, Balance training, Gait training, Patient/Family education, Self Care, and Manual therapy  PLAN FOR NEXT SESSION: begin standing strengthening and balance, manual to decrease edema, check to see if pt is wearing her compression garment.  Advanced HEP  Virgina Organ, PT CLT [phone]  03/31/2023, 5:37 PM

## 2023-04-05 ENCOUNTER — Encounter: Payer: Self-pay | Admitting: Dermatology

## 2023-04-16 ENCOUNTER — Other Ambulatory Visit: Payer: Self-pay | Admitting: Hematology

## 2023-04-16 DIAGNOSIS — Z170 Estrogen receptor positive status [ER+]: Secondary | ICD-10-CM

## 2023-06-01 DIAGNOSIS — I10 Essential (primary) hypertension: Secondary | ICD-10-CM | POA: Diagnosis not present

## 2023-06-01 DIAGNOSIS — E1122 Type 2 diabetes mellitus with diabetic chronic kidney disease: Secondary | ICD-10-CM | POA: Diagnosis not present

## 2023-06-07 DIAGNOSIS — M545 Low back pain, unspecified: Secondary | ICD-10-CM | POA: Diagnosis not present

## 2023-06-07 DIAGNOSIS — H269 Unspecified cataract: Secondary | ICD-10-CM | POA: Diagnosis not present

## 2023-06-07 DIAGNOSIS — E875 Hyperkalemia: Secondary | ICD-10-CM | POA: Diagnosis not present

## 2023-06-07 DIAGNOSIS — D649 Anemia, unspecified: Secondary | ICD-10-CM | POA: Diagnosis not present

## 2023-06-07 DIAGNOSIS — I10 Essential (primary) hypertension: Secondary | ICD-10-CM | POA: Diagnosis not present

## 2023-06-07 DIAGNOSIS — R809 Proteinuria, unspecified: Secondary | ICD-10-CM | POA: Diagnosis not present

## 2023-06-07 DIAGNOSIS — Z853 Personal history of malignant neoplasm of breast: Secondary | ICD-10-CM | POA: Diagnosis not present

## 2023-06-07 DIAGNOSIS — N1831 Chronic kidney disease, stage 3a: Secondary | ICD-10-CM | POA: Diagnosis not present

## 2023-06-07 DIAGNOSIS — M170 Bilateral primary osteoarthritis of knee: Secondary | ICD-10-CM | POA: Diagnosis not present

## 2023-06-07 DIAGNOSIS — E1122 Type 2 diabetes mellitus with diabetic chronic kidney disease: Secondary | ICD-10-CM | POA: Diagnosis not present

## 2023-06-07 DIAGNOSIS — E782 Mixed hyperlipidemia: Secondary | ICD-10-CM | POA: Diagnosis not present

## 2023-06-07 DIAGNOSIS — M8580 Other specified disorders of bone density and structure, unspecified site: Secondary | ICD-10-CM | POA: Diagnosis not present

## 2023-06-11 ENCOUNTER — Encounter: Payer: Self-pay | Admitting: Internal Medicine

## 2023-06-14 DIAGNOSIS — R809 Proteinuria, unspecified: Secondary | ICD-10-CM | POA: Diagnosis not present

## 2023-06-14 DIAGNOSIS — I129 Hypertensive chronic kidney disease with stage 1 through stage 4 chronic kidney disease, or unspecified chronic kidney disease: Secondary | ICD-10-CM | POA: Diagnosis not present

## 2023-06-14 DIAGNOSIS — N1832 Chronic kidney disease, stage 3b: Secondary | ICD-10-CM | POA: Diagnosis not present

## 2023-06-14 DIAGNOSIS — E1122 Type 2 diabetes mellitus with diabetic chronic kidney disease: Secondary | ICD-10-CM | POA: Diagnosis not present

## 2023-06-26 ENCOUNTER — Ambulatory Visit
Admission: EM | Admit: 2023-06-26 | Discharge: 2023-06-26 | Disposition: A | Discharge status: Home / Self Care | Payer: Medicare Other | Attending: Internal Medicine | Admitting: Internal Medicine

## 2023-06-26 ENCOUNTER — Encounter: Payer: Self-pay | Admitting: Emergency Medicine

## 2023-06-26 DIAGNOSIS — M5441 Lumbago with sciatica, right side: Secondary | ICD-10-CM

## 2023-06-26 HISTORY — DX: Unspecified osteoarthritis, unspecified site: M19.90

## 2023-06-26 MED ORDER — PREDNISONE 20 MG PO TABS
20.0000 mg | ORAL_TABLET | Freq: Once | ORAL | Status: AC
  Administered 2023-06-26: 20 mg via ORAL

## 2023-06-26 MED ORDER — PREDNISONE 20 MG PO TABS: 40.0000 mg | ORAL_TABLET | Freq: Every day | ORAL | 0 refills | Status: AC

## 2023-06-26 NOTE — ED Provider Notes (Signed)
[facility]    CSN: [id number] Arrival date & time: 06/26/23  1329      History   Chief Complaint No chief complaint on file.   HPI [name] is a 82 y.o. female.   Patient presents to urgent care for evaluation of pain to the right buttock that radiates down the right leg into the right posterior calf starting 2 days ago on Thursday, June 24, 2023.  She is unsure of what makes the pain better or worse but states it is constant and she has never experienced any pain like this before.  Pain is currently a 10 on a scale of 0-10 and describes this as a sharp shooting sensation.  Denies recent travel, recent falls/trauma/injuries to the area of tenderness. No fall, trauma, numbness or tingling, saddle paresthesia, changes to bowel or urinary habits, extremity weakness.  No recent antibiotic or steroid use reported.  She has not attempted use of any over-the-counter medications to help with pain prior to arrival and denies history of sciatic nerve pain.     Past Medical History:  Diagnosis Date   Arthritis    Breast cancer (HCC)    CKD (chronic kidney disease) stage 2, GFR 60-89 ml/min    Essential hypertension    Hyperlipidemia    Type 2 diabetes mellitus (HCC)    diet controlled    Patient Active Problem List   Diagnosis Date Noted   Post-menopausal 12/07/2016   Malignant neoplasm of upper-inner quadrant of right female breast (HCC)    Chest pain, rule out acute myocardial infarction 06/03/2014   Chest pain 06/03/2014   Essential hypertension 06/03/2014   Hyperglycemia 06/03/2014   Hyponatremia 06/03/2014    Past Surgical History:  Procedure Laterality Date   ABDOMINAL HYSTERECTOMY     BREAST BIOPSY Left    benign   CATARACT EXTRACTION W/PHACO Right 03/14/2021   Procedure: CATARACT EXTRACTION PHACO AND INTRAOCULAR LENS PLACEMENT RIGHT EYE;  Surgeon: Fabio Pierce, MD;  Location: [facility];  Service: Ophthalmology;  Laterality: Right;   right CDE=14.05   EYE SURGERY Left    cataract removal   PARTIAL MASTECTOMY WITH NEEDLE LOCALIZATION AND AXILLARY SENTINEL LYMPH NODE BX Right 10/28/2016   Procedure: PARTIAL MASTECTOMY WITH NEEDLE LOCALIZATION AND AXILLARY SENTINEL LYMPH NODE BX;  Surgeon: Franky Macho, MD;  Location: [facility];  Service: General;  Laterality: Right;    OB History     Gravida  3   Para  3   Term  3   Preterm      AB      Living         SAB      IAB      Ectopic      Multiple      Live Births               Home Medications    Prior to Admission medications   Medication Sig Start Date End Date Taking? Authorizing Provider  predniSONE (DELTASONE) 20 MG tablet Take 2 tablets (40 mg total) by mouth daily for 5 days. 06/26/23 07/01/23 Yes Carlisle Beers, FNP  amLODipine (NORVASC) 10 MG tablet Take 10 mg by mouth daily. 10/02/21   [provider]  anastrozole (ARIMIDEX) 1 MG tablet TAKE 1 TABLET(1 MG) BY MOUTH DAILY 04/16/23   Doreatha Massed, MD  atorvastatin (LIPITOR) 40 MG tablet Take 40 mg by mouth daily. 10/02/21   [provider]  diclofenac Sodium (VOLTAREN) 1 % GEL Apply  4 g topically 4 (four) times daily. 05/01/22   Valentino Nose, NP  HYDROcodone-acetaminophen (NORCO/VICODIN) 5-325 MG tablet Take 1 tablet by mouth every 6 (six) hours as needed for severe pain. 01/19/23   Gilda Crease, MD  losartan (COZAAR) 50 MG tablet Take 50 mg by mouth 2 (two) times daily. 04/12/20   [provider]  magnesium gluconate (MAGONATE) 500 MG tablet Take 500 mg by mouth daily.    [provider]  meloxicam (MOBIC) 7.5 MG tablet Take 1 tablet (7.5 mg total) by mouth daily. 01/19/23   Gilda Crease, MD  Menthol-Camphor 11-11 % CREA Apply 1 application topically 3 (three) times daily as needed (for pain (back)).     [provider]  Polyethyl Glycol-Propyl Glycol (SYSTANE) 0.4-0.3 % SOLN Place 1 drop into both eyes daily.     [provider]    Family History Family History  Problem Relation Age of Onset   Hypertension Mother    Hypertension Father    Diabetes Mellitus II Sister    Diabetes Mellitus II Brother    Diabetes Mellitus II Brother     Social History Social History   Tobacco Use   Smoking status: Never   Smokeless tobacco: Never  Vaping Use   Vaping status: Never Used  Substance Use Topics   Alcohol use: No   Drug use: No     Allergies   Patient has no known allergies.   Review of Systems Review of Systems Per HPI  Physical Exam Triage Vital Signs ED Triage Vitals  Encounter Vitals Group     BP 06/26/23 1500 (!) 186/82     Systolic BP Percentile --      Diastolic BP Percentile --      Pulse Rate 06/26/23 1500 82     Resp 06/26/23 1500 20     Temp 06/26/23 1500 98.3 F (36.8 C)     Temp Source 06/26/23 1500 Oral     SpO2 06/26/23 1500 98 %     Weight --      Height --      Head Circumference --      Peak Flow --      Pain Score 06/26/23 1501 10     Pain Loc --      Pain Education --      Exclude from Growth Chart --    No data found.  Updated Vital Signs BP (!) 186/82 (BP Location: Right Arm)   Pulse 82   Temp 98.3 F (36.8 C) (Oral)   Resp 20   LMP 12/05/2019   SpO2 98%   Visual Acuity Right Eye Distance:   Left Eye Distance:   Bilateral Distance:    Right Eye Near:   Left Eye Near:    Bilateral Near:     Physical Exam Vitals and nursing note reviewed.  Constitutional:      Appearance: She is not ill-appearing or toxic-appearing.  HENT:     Head: Normocephalic and atraumatic.     Right Ear: Hearing and external ear normal.     Left Ear: Hearing and external ear normal.     Nose: Nose normal.     Mouth/Throat:     Lips: Pink.  Eyes:     General: Lids are normal. Vision grossly intact. Gaze aligned appropriately.     Extraocular Movements: Extraocular movements intact.     Conjunctiva/sclera: Conjunctivae normal.  Cardiovascular:      Rate and Rhythm:  Normal rate and regular rhythm.     Heart sounds: Normal heart sounds, S1 normal and S2 normal.  Pulmonary:     Effort: Pulmonary effort is normal. No respiratory distress.     Breath sounds: Normal breath sounds and air entry.  Musculoskeletal:     Cervical back: Normal and neck supple.     Thoracic back: Normal.     Lumbar back: Tenderness present. No swelling, edema, deformity, signs of trauma, lacerations, spasms or bony tenderness. Normal range of motion. No scoliosis.     Comments: Tenderness to palpation at the right SI joint, non-tender to palpation of the right lower extremity including the right thigh and the right knee. Non-tender to midline spine or lumbar paraspinous muscles. Ambulatory with steady gait with cane assistance to baseline. Strength and sensation intact to distal bilateral lower extremities. Denna Haggard' sign negative bilaterally.   Skin:    General: Skin is warm and dry.     Capillary Refill: Capillary refill takes less than 2 seconds.     Findings: No rash.  Neurological:     General: No focal deficit present.     Mental Status: She is alert and oriented to person, place, and time. Mental status is at baseline.     Cranial Nerves: No dysarthria or facial asymmetry.  Psychiatric:        Mood and Affect: Mood normal.        Speech: Speech normal.        Behavior: Behavior normal.        Thought Content: Thought content normal.        Judgment: Judgment normal.      UC Treatments / Results  Labs (all labs ordered are listed, but only abnormal results are displayed) Labs Reviewed - No data to display  EKG   Radiology No results found.  Procedures Procedures (including critical care time)  Medications Ordered in UC Medications  predniSONE (DELTASONE) tablet 20 mg (20 mg Oral Given 06/26/23 1541)    Initial Impression / Assessment and Plan / UC Course  I have reviewed the triage vital signs and the nursing notes.  Pertinent labs  & imaging results that were available during my care of the patient were reviewed by me and considered in my medical decision making (see chart for details).   1. Acute right-sided low back pain with right sided sciatica Evaluation suggests sciatic nerve pain.  No red flag signs/symptoms found on exam indicating need for referral to ED for further workup.  Deferred imaging based on atraumatic mechanism of injury.  Given prednisone 20mg  oral pill in clinic for acute pain as it is late in the afternoon and I would like to avoid high dose of prednisone this late due to insomnia side effect.  Patient is not a candidate for NSAIDs, therefore will treat with short course of steroid to be started tomorrow.  Exercises provided, heat and gentle ROM encouraged. Follow-up with orthopedics as needed, walking referral given.   Counseled patient on potential for adverse effects with medications prescribed/recommended today, strict ER and return-to-clinic precautions discussed, patient verbalized understanding.     Final Clinical Impressions(s) / UC Diagnoses   Final diagnoses:  Acute right-sided low back pain with right-sided sciatica     Discharge Instructions      Your low back pain is due to sciatic nerve pain. Start taking prednisone once daily for the next 5 days starting tomorrow.  Take this with food to avoid stomach upset. Apply heat  to the low back and use gentle range of motion exercises to prevent stiffness to the area.  Please schedule an appointment for follow-up with your primary care provider or the orthopedic provider listed on your paperwork in the next 1-2 days.    ED Prescriptions     Medication Sig Dispense Auth. Provider   predniSONE (DELTASONE) 20 MG tablet Take 2 tablets (40 mg total) by mouth daily for 5 days. 10 tablet Carlisle Beers, FNP      PDMP not reviewed this encounter.   Carlisle Beers, [place] 06/26/23 [phone]

## 2023-06-26 NOTE — ED Triage Notes (Signed)
Right lower leg pain since Thursday.  No known injury

## 2023-06-26 NOTE — Discharge Instructions (Addendum)
Your low back pain is due to sciatic nerve pain. Start taking prednisone once daily for the next 5 days starting tomorrow.  Take this with food to avoid stomach upset. Apply heat to the low back and use gentle range of motion exercises to prevent stiffness to the area.  Please schedule an appointment for follow-up with your primary care provider or the orthopedic provider listed on your paperwork in the next 1-2 days.

## 2023-06-28 DIAGNOSIS — M79609 Pain in unspecified limb: Secondary | ICD-10-CM | POA: Diagnosis not present

## 2023-06-28 DIAGNOSIS — M79601 Pain in right arm: Secondary | ICD-10-CM | POA: Diagnosis not present

## 2023-08-11 DIAGNOSIS — H04123 Dry eye syndrome of bilateral lacrimal glands: Secondary | ICD-10-CM | POA: Diagnosis not present

## 2023-08-19 DIAGNOSIS — R011 Cardiac murmur, unspecified: Secondary | ICD-10-CM | POA: Diagnosis not present

## 2023-08-19 DIAGNOSIS — I10 Essential (primary) hypertension: Secondary | ICD-10-CM | POA: Diagnosis not present

## 2023-08-19 DIAGNOSIS — M545 Low back pain, unspecified: Secondary | ICD-10-CM | POA: Diagnosis not present

## 2023-08-19 DIAGNOSIS — N1831 Chronic kidney disease, stage 3a: Secondary | ICD-10-CM | POA: Diagnosis not present

## 2023-08-19 DIAGNOSIS — R519 Headache, unspecified: Secondary | ICD-10-CM | POA: Diagnosis not present

## 2023-08-19 DIAGNOSIS — E1122 Type 2 diabetes mellitus with diabetic chronic kidney disease: Secondary | ICD-10-CM | POA: Diagnosis not present

## 2023-08-19 DIAGNOSIS — I129 Hypertensive chronic kidney disease with stage 1 through stage 4 chronic kidney disease, or unspecified chronic kidney disease: Secondary | ICD-10-CM | POA: Diagnosis not present

## 2023-08-19 DIAGNOSIS — E875 Hyperkalemia: Secondary | ICD-10-CM | POA: Diagnosis not present

## 2023-09-06 ENCOUNTER — Other Ambulatory Visit (HOSPITAL_COMMUNITY): Payer: Self-pay | Admitting: Nurse Practitioner

## 2023-09-06 DIAGNOSIS — R009 Unspecified abnormalities of heart beat: Secondary | ICD-10-CM | POA: Diagnosis not present

## 2023-09-06 DIAGNOSIS — M79609 Pain in unspecified limb: Secondary | ICD-10-CM | POA: Diagnosis not present

## 2023-09-06 DIAGNOSIS — R008 Other abnormalities of heart beat: Secondary | ICD-10-CM

## 2023-09-06 DIAGNOSIS — M79621 Pain in right upper arm: Secondary | ICD-10-CM | POA: Diagnosis not present

## 2023-09-27 ENCOUNTER — Other Ambulatory Visit (HOSPITAL_COMMUNITY)
Admission: RE | Admit: 2023-09-27 | Discharge: 2023-09-27 | Disposition: A | Discharge status: Home / Self Care | Payer: Medicare Other | Source: Ambulatory Visit | Attending: Nephrology | Admitting: Nephrology

## 2023-09-27 DIAGNOSIS — D649 Anemia, unspecified: Secondary | ICD-10-CM | POA: Diagnosis not present

## 2023-09-27 LAB — RENAL FUNCTION PANEL
Albumin: 3.7 g/dL (ref 3.5–5.0)
Anion gap: 8 (ref 5–15)
BUN: 19 mg/dL (ref 8–23)
CO2: 26 mmol/L (ref 22–32)
Calcium: 9.6 mg/dL (ref 8.9–10.3)
Chloride: 103 mmol/L (ref 98–111)
Creatinine, Ser: 1.14 mg/dL — ABNORMAL HIGH (ref 0.44–1.00)
GFR, Estimated: 48 mL/min — ABNORMAL LOW (ref >60)
Glucose, Bld: 128 mg/dL — ABNORMAL HIGH (ref 70–99)
Phosphorus: 3 mg/dL (ref 2.5–4.6)
Potassium: 4.4 mmol/L (ref 3.5–5.1)
Sodium: 137 mmol/L (ref 135–145)

## 2023-09-27 LAB — IRON AND TIBC
Iron: 104 ug/dL (ref 28–170)
Saturation Ratios: 27 % (ref 10.4–31.8)
TIBC: 389 ug/dL (ref 250–450)
UIBC: 285 ug/dL

## 2023-09-27 LAB — CBC
HCT: 37 % (ref 36.0–46.0)
Hemoglobin: 11.9 g/dL — ABNORMAL LOW (ref 12.0–15.0)
MCH: 29.5 pg (ref 26.0–34.0)
MCHC: 32.2 g/dL (ref 30.0–36.0)
MCV: 91.6 fL (ref 80.0–100.0)
Platelets: 274 10*3/uL (ref 150–400)
RBC: 4.04 MIL/uL (ref 3.87–5.11)
RDW: 12.5 % (ref 11.5–15.5)
WBC: 4.9 10*3/uL (ref 4.0–10.5)
nRBC: 0 % (ref 0.0–0.2)

## 2023-09-27 LAB — PROTEIN / CREATININE RATIO, URINE
Creatinine, Urine: 95 mg/dL
Protein Creatinine Ratio: 0.08 mg/mg{creat} (ref 0.00–0.15)
Total Protein, Urine: 8 mg/dL

## 2023-09-27 LAB — FERRITIN: Ferritin: 87 ng/mL (ref 11–307)

## 2023-09-29 LAB — PARATHYROID HORMONE, INTACT (NO CA): PTH: 28 pg/mL (ref 15–65)

## 2023-10-04 DIAGNOSIS — I5032 Chronic diastolic (congestive) heart failure: Secondary | ICD-10-CM | POA: Diagnosis not present

## 2023-10-04 DIAGNOSIS — N1831 Chronic kidney disease, stage 3a: Secondary | ICD-10-CM | POA: Diagnosis not present

## 2023-10-04 DIAGNOSIS — E1122 Type 2 diabetes mellitus with diabetic chronic kidney disease: Secondary | ICD-10-CM | POA: Diagnosis not present

## 2023-10-04 DIAGNOSIS — I129 Hypertensive chronic kidney disease with stage 1 through stage 4 chronic kidney disease, or unspecified chronic kidney disease: Secondary | ICD-10-CM | POA: Diagnosis not present

## 2023-10-08 DIAGNOSIS — M545 Low back pain, unspecified: Secondary | ICD-10-CM | POA: Diagnosis not present

## 2023-10-08 DIAGNOSIS — R011 Cardiac murmur, unspecified: Secondary | ICD-10-CM | POA: Diagnosis not present

## 2023-10-08 DIAGNOSIS — I10 Essential (primary) hypertension: Secondary | ICD-10-CM | POA: Diagnosis not present

## 2023-10-08 DIAGNOSIS — M170 Bilateral primary osteoarthritis of knee: Secondary | ICD-10-CM | POA: Diagnosis not present

## 2023-10-08 DIAGNOSIS — M8580 Other specified disorders of bone density and structure, unspecified site: Secondary | ICD-10-CM | POA: Diagnosis not present

## 2023-10-08 DIAGNOSIS — D649 Anemia, unspecified: Secondary | ICD-10-CM | POA: Diagnosis not present

## 2023-10-08 DIAGNOSIS — N1831 Chronic kidney disease, stage 3a: Secondary | ICD-10-CM | POA: Diagnosis not present

## 2023-10-08 DIAGNOSIS — M79609 Pain in unspecified limb: Secondary | ICD-10-CM | POA: Diagnosis not present

## 2023-10-08 DIAGNOSIS — E875 Hyperkalemia: Secondary | ICD-10-CM | POA: Diagnosis not present

## 2023-10-08 DIAGNOSIS — R519 Headache, unspecified: Secondary | ICD-10-CM | POA: Diagnosis not present

## 2023-10-08 DIAGNOSIS — E1122 Type 2 diabetes mellitus with diabetic chronic kidney disease: Secondary | ICD-10-CM | POA: Diagnosis not present

## 2023-10-08 DIAGNOSIS — H269 Unspecified cataract: Secondary | ICD-10-CM | POA: Diagnosis not present

## 2023-10-08 DIAGNOSIS — R008 Other abnormalities of heart beat: Secondary | ICD-10-CM | POA: Diagnosis not present

## 2023-10-12 ENCOUNTER — Other Ambulatory Visit: Payer: Self-pay | Admitting: Physician Assistant

## 2023-10-12 DIAGNOSIS — C50911 Malignant neoplasm of unspecified site of right female breast: Secondary | ICD-10-CM

## 2023-10-12 DIAGNOSIS — D649 Anemia, unspecified: Secondary | ICD-10-CM

## 2023-10-12 NOTE — Progress Notes (Signed)
Patient lost to follow-up after visit in 2022, scheduled to reestablish care next week. Labs obtained by Dr. Wolfgang Phoenix on 09/27/2023 included CBC, ferritin, iron/TIBC, renal function panel. We will check additional labs prior to her visit to include LFTs, vitamin D, B12, MMA.  Carnella Guadalajara, PA-C 10/12/23 3:58 PM

## 2023-10-15 ENCOUNTER — Inpatient Hospital Stay: Payer: Medicare Other | Attending: Hematology

## 2023-10-15 DIAGNOSIS — G8929 Other chronic pain: Secondary | ICD-10-CM | POA: Insufficient documentation

## 2023-10-15 DIAGNOSIS — C50211 Malignant neoplasm of upper-inner quadrant of right female breast: Secondary | ICD-10-CM | POA: Diagnosis not present

## 2023-10-15 DIAGNOSIS — Z833 Family history of diabetes mellitus: Secondary | ICD-10-CM | POA: Diagnosis not present

## 2023-10-15 DIAGNOSIS — Z79899 Other long term (current) drug therapy: Secondary | ICD-10-CM | POA: Diagnosis not present

## 2023-10-15 DIAGNOSIS — Z79811 Long term (current) use of aromatase inhibitors: Secondary | ICD-10-CM | POA: Diagnosis not present

## 2023-10-15 DIAGNOSIS — D649 Anemia, unspecified: Secondary | ICD-10-CM | POA: Diagnosis not present

## 2023-10-15 DIAGNOSIS — J984 Other disorders of lung: Secondary | ICD-10-CM | POA: Diagnosis not present

## 2023-10-15 DIAGNOSIS — Z8249 Family history of ischemic heart disease and other diseases of the circulatory system: Secondary | ICD-10-CM | POA: Insufficient documentation

## 2023-10-15 DIAGNOSIS — Z170 Estrogen receptor positive status [ER+]: Secondary | ICD-10-CM | POA: Diagnosis not present

## 2023-10-15 DIAGNOSIS — Z1732 Human epidermal growth factor receptor 2 negative status: Secondary | ICD-10-CM | POA: Diagnosis not present

## 2023-10-15 DIAGNOSIS — N1831 Chronic kidney disease, stage 3a: Secondary | ICD-10-CM | POA: Insufficient documentation

## 2023-10-15 DIAGNOSIS — M1990 Unspecified osteoarthritis, unspecified site: Secondary | ICD-10-CM | POA: Insufficient documentation

## 2023-10-15 DIAGNOSIS — M8580 Other specified disorders of bone density and structure, unspecified site: Secondary | ICD-10-CM | POA: Insufficient documentation

## 2023-10-15 DIAGNOSIS — Z90710 Acquired absence of both cervix and uterus: Secondary | ICD-10-CM | POA: Diagnosis not present

## 2023-10-15 DIAGNOSIS — C50911 Malignant neoplasm of unspecified site of right female breast: Secondary | ICD-10-CM

## 2023-10-15 DIAGNOSIS — Z1721 Progesterone receptor positive status: Secondary | ICD-10-CM | POA: Insufficient documentation

## 2023-10-15 LAB — HEPATIC FUNCTION PANEL
ALT: 19 U/L (ref 0–44)
AST: 31 U/L (ref 15–41)
Albumin: 3.8 g/dL (ref 3.5–5.0)
Alkaline Phosphatase: 84 U/L (ref 38–126)
Bilirubin, Direct: 0.1 mg/dL (ref 0.0–0.2)
Indirect Bilirubin: 0.6 mg/dL (ref 0.3–0.9)
Total Bilirubin: 0.7 mg/dL (ref 0.0–1.2)
Total Protein: 7.5 g/dL (ref 6.5–8.1)

## 2023-10-15 LAB — VITAMIN D 25 HYDROXY (VIT D DEFICIENCY, FRACTURES): Vit D, 25-Hydroxy: 53.08 ng/mL (ref 30–100)

## 2023-10-15 LAB — VITAMIN B12: Vitamin B-12: 323 pg/mL (ref 180–914)

## 2023-10-18 LAB — METHYLMALONIC ACID, SERUM: Methylmalonic Acid, Quantitative: 235 nmol/L (ref 0–378)

## 2023-10-18 NOTE — Progress Notes (Unsigned)
 [facility] [address], [place] [address]   CLINIC:  Medical Oncology/Hematology  PCP:  Catherine Stabile, Ashley [address] Laurey Mor[place] [address] [phone]   REASON FOR VISIT: Follow-up for breast cancer   PRIOR THERAPY: Right lumpectomy with sentinel lymph node biopsy (10/28/2016)   CURRENT THERAPY: Arimidex started April 2018, goal is 10 years of therapy   CANCER STAGING: Cancer Staging  Malignant neoplasm of upper-inner quadrant of right female breast (HCC) Staging form: Breast, AJCC 8th Edition - Clinical: Stage IA (cT1b, cN0(sn), cM0, G1, ER+, PR+, HER2-) - Signed by Catherine Cork, Ashley on 12/07/2016   INTERVAL HISTORY:   [name], a 83 y.o. female, returns for routine follow-up of her stage Ia right-sided breast cancer. [name] was last seen on 11/26/2020 by [name], but was lost to follow-up after that visit.  She returns today to reestablish care..   At today's visit, she  reports feeling ***.  She denies any recent hospitalizations, surgeries, or changes in her  baseline health status.  She denies any symptoms of recurrence such as new lumps, bone pain, chest pain, dyspnea, or abdominal pain.  She has no new headaches, seizures, or focal neurologic deficits.  No B symptoms such as fever, chills, night sweats, unintentional weight loss.  She continues to take *** ARIMIDEX:  Hot flashes, mood swings, weight gain, alopecia, and musculoskeletal pain *** Daily calcium and vitamin D  She reports ***% energy and ***% appetite.  She is maintaining stable weight at this time.   ASSESSMENT & PLAN:  1.  Stage Ia invasive ductal carcinoma of the upper-inner quadrant of right breast: - Patient was diagnosed in 08/2016 after an abnormal screening mammogram with invasive ductal carcinoma ER positive/PR positive/HER-2 negative. - She was treated with a right lumpectomy with SLNB with [name] on 10/28/2016. - [name] did not  think patient would benefit from adjuvant radiation therapy. - She has been taking antiestrogen therapy Arimidex since 11/2016.  Goal is 10 years of therapy.  She is tolerating this very well.*** - Patient also had left lumpectomy in [place] about 25 years ago, which was benign - Most recent mammogram (11/23/2022): BI-RADS Category 1, negative. - Most recent labs (January/February 2025): CBC at baseline, normal LFTs.  Baseline CKD stage IIIa. - Physical exam *** - No "red flag" symptoms per history/ROS - PLAN: Next mammogram due March 2025 (11/24/2023) *** - Labs and RTC in 1 year. - Continue daily anastrozole/Arimidex.  2.  Pulmonary lesions: - Patient had CTA of chest done on 12/20/2017 which showed multiple bilateral partially calcified pleural plaques are consistent with prior asbestos exposure. - She denies a history of smoking.  - She was referred to [facility] pulmonary for evaluation and work-up.  3.  Bone health - Last bone density was done on 04/17/2020 which showed a T score of -0.8. - Most recent labs (10/15/2023): Normal vitamin D53.08.  Calcium 9.6. - She continues taking calcium and vitamin D daily. - PLAN: Due for DEXA/bone density scan.  (Will call with results if any significant worsening requiring treatment.) - Continue with calcium and vitamin D supplements daily.  PLAN SUMMARY: >> DEXA/bone density scan >> Screening mammogram due 11/24/2023 >> Labs in 1 year = CBC/D, CMP, vitamin D, ferritin, iron/TIBC >> OFFICE visit in 1 year (after labs) ***   REVIEW OF SYSTEMS: ***  Review of Systems - Oncology  PHYSICAL EXAM:   Performance status (ECOG): {  CHL ONC WU:[id number]} *** There were no vitals filed for this visit. Wt Readings from Last 3 Encounters:  01/18/23 178 lb 9.2 oz (81 kg)  11/26/20 178 lb 1.6 oz (80.8 kg)  04/19/20 179 lb 12.8 oz (81.6 kg)   Physical Exam Constitutional:      Appearance: Normal appearance.  HENT:     Head: Normocephalic and  atraumatic.     Mouth/Throat:     Mouth: Mucous membranes are moist.  Eyes:     Extraocular Movements: Extraocular movements intact.     Pupils: Pupils are equal, round, and reactive to light.  Cardiovascular:     Rate and Rhythm: Normal rate and regular rhythm.     Pulses: Normal pulses.     Heart sounds: Normal heart sounds.  Pulmonary:     Effort: Pulmonary effort is normal.     Breath sounds: Normal breath sounds.  Chest:    Abdominal:     General: Bowel sounds are normal.     Palpations: Abdomen is soft.     Tenderness: There is no abdominal tenderness.  Musculoskeletal:        General: No swelling.     Right lower leg: No edema.     Left lower leg: No edema.  Lymphadenopathy:     Cervical: No cervical adenopathy.     Upper Body:     Right upper body: No supraclavicular, axillary or pectoral adenopathy.     Left upper body: No supraclavicular, axillary or pectoral adenopathy.  Skin:    General: Skin is warm and dry.  Neurological:     General: No focal deficit present.     Mental Status: She is alert and oriented to person, place, and time.  Psychiatric:        Mood and Affect: Mood normal.        Behavior: Behavior normal.      PAST MEDICAL/SURGICAL HISTORY:  Past Medical History:  Diagnosis Date   Arthritis    Breast cancer (HCC)    CKD (chronic kidney disease) stage 2, GFR 60-89 ml/min    Essential hypertension    Hyperlipidemia    Type 2 diabetes mellitus (HCC)    diet controlled   Past Surgical History:  Procedure Laterality Date   ABDOMINAL HYSTERECTOMY     BREAST BIOPSY Left    benign   CATARACT EXTRACTION W/PHACO Right 03/14/2021   Procedure: CATARACT EXTRACTION PHACO AND INTRAOCULAR LENS PLACEMENT RIGHT EYE;  Surgeon: Catherine Pierce, Ashley;  Location: [facility];  Service: Ophthalmology;  Laterality: Right;  right CDE=14.05   EYE SURGERY Left    cataract removal   PARTIAL MASTECTOMY WITH NEEDLE LOCALIZATION AND AXILLARY SENTINEL LYMPH NODE BX Right  10/28/2016   Procedure: PARTIAL MASTECTOMY WITH NEEDLE LOCALIZATION AND AXILLARY SENTINEL LYMPH NODE BX;  Surgeon: [name];  Location: [facility];  Service: General;  Laterality: Right;    SOCIAL HISTORY:  Social History   Socioeconomic History   Marital status: Widowed    Spouse name: Not on file   Number of children: Not on file   Years of education: Not on file   Highest education level: Not on file  Occupational History   Not on file  Tobacco Use   Smoking status: Never   Smokeless tobacco: Never  Vaping Use   Vaping status: Never Used  Substance and Sexual Activity   Alcohol use: No   Drug use: No   Sexual activity: Not Currently    Birth control/protection:  Surgical  Other Topics Concern   Not on file  Social History Narrative   Not on file   Social Drivers of Health   Financial Resource Strain: Not on file  Food Insecurity: Not on file  Transportation Needs: Not on file  Physical Activity: Not on file  Stress: Not on file  Social Connections: Not on file  Intimate Partner Violence: Not on file    FAMILY HISTORY:  Family History  Problem Relation Age of Onset   Hypertension Mother    Hypertension Father    Diabetes Mellitus II Sister    Diabetes Mellitus II Brother    Diabetes Mellitus II Brother     CURRENT MEDICATIONS:  Current Outpatient Medications  Medication Sig Dispense Refill   amLODipine (NORVASC) 10 MG tablet Take 10 mg by mouth daily.     anastrozole (ARIMIDEX) 1 MG tablet TAKE 1 TABLET(1 MG) BY MOUTH DAILY 90 tablet 6   atorvastatin (LIPITOR) 40 MG tablet Take 40 mg by mouth daily.     diclofenac Sodium (VOLTAREN) 1 % GEL Apply 4 g topically 4 (four) times daily. 50 g 0   HYDROcodone-acetaminophen (NORCO/VICODIN) 5-325 MG tablet Take 1 tablet by mouth every 6 (six) hours as needed for severe pain. 6 tablet 0   losartan (COZAAR) 50 MG tablet Take 50 mg by mouth 2 (two) times daily.     magnesium gluconate (MAGONATE) 500 MG tablet Take  500 mg by mouth daily.     meloxicam (MOBIC) 7.5 MG tablet Take 1 tablet (7.5 mg total) by mouth daily. 30 tablet 0   Menthol-Camphor 11-11 % CREA Apply 1 application topically 3 (three) times daily as needed (for pain (back)).      Polyethyl Glycol-Propyl Glycol (SYSTANE) 0.4-0.3 % SOLN Place 1 drop into both eyes daily.     No current facility-administered medications for this visit.    ALLERGIES:  No Known Allergies  LABORATORY DATA:  I have reviewed the labs as listed.     Latest Ref Rng & Units 09/27/2023   12:05 PM 11/10/2022    3:03 PM 02/25/2022    2:12 PM  CBC  WBC 4.0 - 10.5 K/uL 4.9  4.1  4.4   Hemoglobin 12.0 - 15.0 g/dL 32.3  55.7  32.2   Hematocrit 36.0 - 46.0 % 37.0  37.0  36.4   Platelets 150 - 400 K/uL 274  251  251       Latest Ref Rng & Units 10/15/2023   11:22 AM 09/27/2023   12:05 PM 11/10/2022    3:05 PM  CMP  Glucose 70 - 99 mg/dL  025  427   BUN 8 - 23 mg/dL  19  17   Creatinine 0.62 - 1.00 mg/dL  3.76  2.83   Sodium 151 - 145 mmol/L  137  140   Potassium 3.5 - 5.1 mmol/L  4.4  4.1   Chloride 98 - 111 mmol/L  103  105   CO2 22 - 32 mmol/L  26  24   Calcium 8.9 - 10.3 mg/dL  9.6  9.5   Total Protein 6.5 - 8.1 g/dL 7.5     Total Bilirubin 0.0 - 1.2 mg/dL 0.7     Alkaline Phos 38 - 126 U/L 84     AST 15 - 41 U/L 31     ALT 0 - 44 U/L 19       DIAGNOSTIC IMAGING:  I have independently reviewed the scans and discussed with  the patient. No results found.   WRAP UP:  All questions were answered. The patient knows to call the clinic with any problems, questions or concerns.  Medical decision making: ***  Time spent on visit: I spent {CHL ONC TIME VISIT - ZOXWR:[id number]} counseling the patient face to face. The total time spent in the appointment was {CHL ONC TIME VISIT - BJYNW:[id number]} and more than 50% was on counseling.  Carnella Guadalajara, Ashley  ***

## 2023-10-19 ENCOUNTER — Inpatient Hospital Stay: Payer: Medicare Other | Admitting: Physician Assistant

## 2023-10-19 VITALS — BP 174/82 | HR 95 | Temp 97.9°F | Resp 20 | Wt 160.1 lb

## 2023-10-19 DIAGNOSIS — C50211 Malignant neoplasm of upper-inner quadrant of right female breast: Secondary | ICD-10-CM | POA: Diagnosis not present

## 2023-10-19 DIAGNOSIS — Z170 Estrogen receptor positive status [ER+]: Secondary | ICD-10-CM | POA: Diagnosis not present

## 2023-10-19 DIAGNOSIS — Z79811 Long term (current) use of aromatase inhibitors: Secondary | ICD-10-CM

## 2023-10-19 DIAGNOSIS — D649 Anemia, unspecified: Secondary | ICD-10-CM | POA: Diagnosis not present

## 2023-10-19 DIAGNOSIS — N1831 Chronic kidney disease, stage 3a: Secondary | ICD-10-CM | POA: Diagnosis not present

## 2023-10-19 DIAGNOSIS — Z8249 Family history of ischemic heart disease and other diseases of the circulatory system: Secondary | ICD-10-CM | POA: Diagnosis not present

## 2023-10-19 DIAGNOSIS — Z79899 Other long term (current) drug therapy: Secondary | ICD-10-CM | POA: Diagnosis not present

## 2023-10-19 DIAGNOSIS — C50911 Malignant neoplasm of unspecified site of right female breast: Secondary | ICD-10-CM

## 2023-10-19 DIAGNOSIS — G8929 Other chronic pain: Secondary | ICD-10-CM | POA: Diagnosis not present

## 2023-10-19 DIAGNOSIS — Z833 Family history of diabetes mellitus: Secondary | ICD-10-CM | POA: Diagnosis not present

## 2023-10-19 DIAGNOSIS — M1990 Unspecified osteoarthritis, unspecified site: Secondary | ICD-10-CM | POA: Diagnosis not present

## 2023-10-19 DIAGNOSIS — M8580 Other specified disorders of bone density and structure, unspecified site: Secondary | ICD-10-CM | POA: Diagnosis not present

## 2023-10-19 DIAGNOSIS — Z1732 Human epidermal growth factor receptor 2 negative status: Secondary | ICD-10-CM | POA: Diagnosis not present

## 2023-10-19 DIAGNOSIS — J984 Other disorders of lung: Secondary | ICD-10-CM | POA: Diagnosis not present

## 2023-10-19 DIAGNOSIS — Z1721 Progesterone receptor positive status: Secondary | ICD-10-CM | POA: Diagnosis not present

## 2023-10-19 MED ORDER — CYANOCOBALAMIN 500 MCG PO TABS: 500.0000 ug | ORAL_TABLET | Freq: Every day | ORAL | 3 refills | Status: AC

## 2023-10-19 MED ORDER — FERROUS SULFATE 325 (65 FE) MG PO TBEC: 325.0000 mg | DELAYED_RELEASE_TABLET | Freq: Every day | ORAL | 3 refills | Status: AC

## 2023-10-19 MED ORDER — ANASTROZOLE 1 MG PO TABS: 1.0000 mg | ORAL_TABLET | Freq: Every day | ORAL | 3 refills | Status: DC

## 2023-10-19 NOTE — Patient Instructions (Addendum)
 [facility] at [facility] **VISIT SUMMARY & IMPORTANT INSTRUCTIONS **   You were seen today by Rojelio Brenner PA-C for your history of breast cancer.    HISTORY OF BREAST CANCER Your most recent labs, mammogram, and physical exam did not show any evidence of recurrent breast cancer. Continue taking your breast cancer hormone pill (anastrozole, a.k.a. Arimidex) once daily. Your next mammogram is due March 2025. We will see you for office visit again in 1 year.  If you have any new concerns before that time, please do not hesitate to reach out to our office!  BONE HEALTH Since your breast cancer hormone pill can cause decreased bone strength, it is important to continue taking calcium and vitamin D daily to support healthy bones.  Continue weightbearing exercise as tolerated.  ANEMIA You have mild anemia related to your chronic kidney disease. Please make sure that you are taking iron tablet (ferrous sulfate 325 mg) every other day. You should also take vitamin B12 500 mcg every other day.   FOLLOW-UP APPOINTMENT: 1 year  ** Thank you for trusting me with your healthcare!  I strive to provide all of my patients with quality care at each visit.  If you receive a survey for this visit, I would be so grateful to you for taking the time to provide feedback.  Thank you in advance!  ~ Demondre Aguas                   Dr. Doreatha Massed   &   Rojelio Brenner, PA-C   - - - - - - - - - - - - - - - - - -    Thank you for choosing [facility] at [facility] to provide your oncology and hematology care.  To afford each patient quality time with our provider, please arrive at least 15 minutes before your scheduled appointment time.   If you have a lab appointment with the Cancer Center please come in thru the Main Entrance and check in at the main information desk.  You need to re-schedule your appointment should you arrive 10 or more minutes late.   We strive to give you quality time with our providers, and arriving late affects you and other patients whose appointments are after yours.  Also, if you no show three or more times for appointments you may be dismissed from the clinic at the providers discretion.     Again, thank you for choosing [facility].  Our hope is that these requests will decrease the amount of time that you wait before being seen by our physicians.       _____________________________________________________________  Should you have questions after your visit to [facility], please contact our office at [phone] and follow the prompts.  Our office hours are 8:00 a.m. and 4:30 p.m. Monday - Friday.  Please note that voicemails left after 4:00 p.m. may not be returned until the following business day.  We are closed weekends and major holidays.  You do have access to a nurse 24-7, just call the main number to the clinic [phone] and do not press any options, hold on the line and a nurse will answer the phone.    For prescription refill requests, have your pharmacy contact our office and allow 72 hours.

## 2023-10-27 DIAGNOSIS — E1122 Type 2 diabetes mellitus with diabetic chronic kidney disease: Secondary | ICD-10-CM | POA: Diagnosis not present

## 2023-10-27 DIAGNOSIS — Z853 Personal history of malignant neoplasm of breast: Secondary | ICD-10-CM | POA: Diagnosis not present

## 2023-10-27 DIAGNOSIS — I10 Essential (primary) hypertension: Secondary | ICD-10-CM | POA: Diagnosis not present

## 2023-10-27 DIAGNOSIS — E782 Mixed hyperlipidemia: Secondary | ICD-10-CM | POA: Diagnosis not present

## 2023-10-27 DIAGNOSIS — M545 Low back pain, unspecified: Secondary | ICD-10-CM | POA: Diagnosis not present

## 2023-11-03 ENCOUNTER — Encounter: Payer: Self-pay | Admitting: Internal Medicine

## 2023-11-03 ENCOUNTER — Ambulatory Visit: Attending: Internal Medicine | Admitting: Internal Medicine

## 2023-11-03 VITALS — BP 160/96 | HR 75 | Ht 63.0 in | Wt 161.4 lb

## 2023-11-03 DIAGNOSIS — R011 Cardiac murmur, unspecified: Secondary | ICD-10-CM

## 2023-11-03 MED ORDER — LOSARTAN POTASSIUM 100 MG PO TABS: 100.0000 mg | ORAL_TABLET | Freq: Every day | ORAL | 3 refills | Status: AC

## 2023-11-03 NOTE — Patient Instructions (Signed)
 Medication Instructions:   Increase Losartan to 100 mg Daily   *If you need a refill on your cardiac medications before your next appointment, please call your pharmacy*   Lab Work: NONE   If you have labs (blood work) drawn today and your tests are completely normal, you will receive your results only by: MyChart Message (if you have MyChart) OR A paper copy in the mail If you have any lab test that is abnormal or we need to change your treatment, we will call you to review the results.   Testing/Procedures: NONE    Follow-Up: At [facility], you and your health needs are our priority.  As part of our continuing mission to provide you with exceptional heart care, we have created designated Provider Care Teams.  These Care Teams include your primary Cardiologist (physician) and Advanced Practice Providers (APPs -  Physician Assistants and Nurse Practitioners) who all work together to provide you with the care you need, when you need it.  We recommend signing up for the patient portal called "MyChart".  Sign up information is provided on this After Visit Summary.  MyChart is used to connect with patients for Virtual Visits (Telemedicine).  Patients are able to view lab/test results, encounter notes, upcoming appointments, etc.  Non-urgent messages can be sent to your provider as well.   To learn more about what you can do with MyChart, go to [email or web address].    Your next appointment:   To Be Determined   Provider:   You may see Dietrich Pates, MD or one of the following Advanced Practice Providers on your designated Care Team:   Randall An, PA-C  Jacolyn Reedy, PA-C     Other Instructions Thank you for choosing [facility]!

## 2023-11-03 NOTE — Progress Notes (Signed)
 Cardiology Office Note   Date:  11/03/2023   ID:  [name], DOB [birth year], MRN [id number]  PCP:  Benita Stabile, MD  Cardiologist:   Dietrich Pates, MD   Pt referred to cardiology for murmur   History of Present Illness: [name] is a 83 y.o. female with a history of HTN, T2DM, HL chest pain  Seen in [facility] ER in April 2019   Seen in follow up by Ival Bible in May 2019   The pt says she is feeling good   Denies CP   No SOB    No dizziness  No palpitations   Current Meds  Medication Sig   amLODipine (NORVASC) 10 MG tablet Take 10 mg by mouth daily.   anastrozole (ARIMIDEX) 1 MG tablet Take 1 tablet (1 mg total) by mouth daily.   atorvastatin (LIPITOR) 40 MG tablet Take 40 mg by mouth daily.   cyanocobalamin (V-R VITAMIN B-12) 500 MCG tablet Take 1 tablet (500 mcg total) by mouth daily.   ferrous sulfate 325 (65 FE) MG EC tablet Take 1 tablet (325 mg total) by mouth daily with breakfast.   hydrochlorothiazide (HYDRODIURIL) 12.5 MG tablet Take 12.5 mg by mouth every morning.   HYDROcodone-acetaminophen (NORCO/VICODIN) 5-325 MG tablet Take 1 tablet by mouth every 6 (six) hours as needed for severe pain.   losartan (COZAAR) 100 MG tablet Take 1 tablet (100 mg total) by mouth daily.   magnesium gluconate (MAGONATE) 500 MG tablet Take 500 mg by mouth daily.   [DISCONTINUED] losartan (COZAAR) 50 MG tablet Take 50 mg by mouth daily.     Allergies:   Patient has no known allergies.   Past Medical History:  Diagnosis Date   Arthritis    Breast cancer (HCC)    CKD (chronic kidney disease) stage 2, GFR 60-89 ml/min    Essential hypertension    Hyperlipidemia    Type 2 diabetes mellitus (HCC)    diet controlled    Past Surgical History:  Procedure Laterality Date   ABDOMINAL HYSTERECTOMY     BREAST BIOPSY Left    benign   CATARACT EXTRACTION W/PHACO Right 03/14/2021   Procedure: CATARACT EXTRACTION PHACO AND INTRAOCULAR LENS PLACEMENT RIGHT EYE;  Surgeon: Fabio Pierce,  MD;  Location: [facility];  Service: Ophthalmology;  Laterality: Right;  right CDE=14.05   EYE SURGERY Left    cataract removal   PARTIAL MASTECTOMY WITH NEEDLE LOCALIZATION AND AXILLARY SENTINEL LYMPH NODE BX Right 10/28/2016   Procedure: PARTIAL MASTECTOMY WITH NEEDLE LOCALIZATION AND AXILLARY SENTINEL LYMPH NODE BX;  Surgeon: Franky Macho, MD;  Location: [facility];  Service: General;  Laterality: Right;     Social History:  The patient  reports that she has never smoked. She has never used smokeless tobacco. She reports that she does not drink alcohol and does not use drugs.   Family History:  The patient's family history includes Diabetes Mellitus II in her brother, brother, and sister; Hypertension in her father and mother.    ROS:  Please see the history of present illness. All other systems are reviewed and  Negative to the above problem except as noted.    PHYSICAL EXAM: VS:  BP (!) 160/96 (Patient Position: Sitting, Cuff Size: Normal)   Pulse 75   Ht 5\' 3"  (1.6 m)   Wt 161 lb 6.4 oz (73.2 kg)   LMP 12/05/2019   SpO2 99%   BMI 28.59 kg/m   GEN: Well nourished, well developed, in no  acute distress  HEENT: normal  Neck: no JVD, carotid bruits Cardiac: RRR; Gr II/VI early peaking systolic murmur  No LE edema  Respiratory:  clear to auscultation bilaterally, GI: soft, nontender, no masses No hepatomegaly  MS: no deformity Moving all extremities   Skin: warm and dry, no rash   EKG:  EKG is not  ordered today.  Echo 2023     1. Left ventricular ejection fraction, by estimation, is 65 to 70%. The  left ventricle has normal function. The left ventricle has no regional  wall motion abnormalities. There is moderate left ventricular hypertrophy.  Left ventricular diastolic  parameters are consistent with Grade I diastolic dysfunction (impaired  relaxation).   2. Right ventricular systolic function is normal. The right ventricular  size is normal. There is normal pulmonary artery  systolic pressure.   3. Left atrial size was mildly dilated.   4. The mitral valve is normal in structure. No evidence of mitral valve  regurgitation. No evidence of mitral stenosis.   5. The aortic valve is tricuspid. There is moderate calcification of the  aortic valve. There is moderate thickening of the aortic valve. Aortic  valve regurgitation is not visualized. No aortic stenosis is present.   6. Aortic dilatation noted. There is mild dilatation of the ascending  aorta, measuring 36 mm.   7. The inferior vena cava is normal in size with greater than 50%  respiratory variability, suggesting right atrial pressure of 3 mmHg.  Echocardiogram 10/06/2016:   - Left ventricle: The cavity size was normal. Wall thickness was   increased in a pattern of moderate LVH. Systolic function was   normal. The estimated ejection fraction was in the range of 60%   to 65%. Doppler parameters are consistent with abnormal left   ventricular relaxation (grade 1 diastolic dysfunction). - Aortic valve: Calcified non coronary cusp. - Mitral valve: Calcified annulus. - Atrial septum: No defect or patent foramen ovale was identified.   Chest CTA 12/20/2017: IMPRESSION: 1. No evidence of pulmonary embolism or other acute findings in the chest. 2. Multiple bilateral partially calcified pleural plaques are consistent with prior asbestos exposure. No evidence of associated asbestosis, pleural tumor or other pulmonary abnormalities.  Lipid Panel    Component Value Date/Time   CHOL 188 06/03/2014 0330   TRIG 137 06/03/2014 0330   HDL 62 06/03/2014 0330   CHOLHDL 3.0 06/03/2014 0330   VLDL 27 06/03/2014 0330   LDLCALC 99 06/03/2014 0330      Wt Readings from Last 3 Encounters:  11/03/23 161 lb 6.4 oz (73.2 kg)  10/19/23 160 lb 0.9 oz (72.6 kg)  01/18/23 178 lb 9.2 oz (81 kg)      ASSESSMENT AND PLAN:  1  Murmur   Pt with aortic valve sclerosis   I have reviewed images from echo done in 2022.   Murmur today is consistent with this   Early peaking, good S2.   Follow clinically for now     2  HTN  Pt reports that her BP is running high at home   She blames on sciatic nerve pain Recomm:  For now I would recomm increasing losartan to 100 mg daily  COntinue other meds     She probably will need more/other agents.  I would have er come back in 4 wks with BP log and BP cuff      3  HL LDL 118   She says making changes in diet   This  weill need to be followed   Keep on atorvastatin  4  DM   A1C 7.3 in Feb 2025   Reviewed diet   Cut out carbs, sugars. WIll need to be followed closely   Pt follows with Dr Margo Aye.     Current medicines are reviewed at length with the patient today.  The patient does not have concerns regarding medicines.  Signed, Dietrich Pates, MD  11/03/2023 9:00 PM    [facility] [address] [place], [place], [place]  [address] Phone: [phone]; Fax: [phone]

## 2023-11-22 ENCOUNTER — Ambulatory Visit: Admitting: Podiatry

## 2023-11-26 ENCOUNTER — Ambulatory Visit (HOSPITAL_COMMUNITY): Payer: Medicare Other

## 2023-11-29 ENCOUNTER — Ambulatory Visit: Admitting: Podiatry

## 2023-12-03 ENCOUNTER — Ambulatory Visit: Admitting: Podiatry

## 2023-12-03 DIAGNOSIS — M19072 Primary osteoarthritis, left ankle and foot: Secondary | ICD-10-CM | POA: Diagnosis not present

## 2023-12-03 NOTE — Progress Notes (Signed)
 Subjective:  Patient ID: [name], female    DOB: November 08, 1940,  MRN: [id number]  Chief Complaint  Patient presents with   Foot Pain    Left foot pain. Pain is primarily located in her arch. Ongoing about 203 months. She did have a fall back in July 2024. Denies any recent injury.     83 y.o. female presents with the above complaint.  Patient presents with left midfoot arthritis pain.  She states it causing her some stiffness.  She denies any injury she did have a fall back in July 2024 but wanted to get it evaluated does not cause a lot of pain pain scale is 2 out of 10 dull aching nature hurts with ambulation worse with pressure.  She has not seen and was prior to seeing me   Review of Systems: Negative except as noted in the HPI. Denies N/V/F/Ch.  Past Medical History:  Diagnosis Date   Arthritis    Breast cancer (HCC)    CKD (chronic kidney disease) stage 2, GFR 60-89 ml/min    Essential hypertension    Hyperlipidemia    Type 2 diabetes mellitus (HCC)    diet controlled    Current Outpatient Medications:    amLODipine (NORVASC) 10 MG tablet, Take 10 mg by mouth daily., Disp: , Rfl:    anastrozole (ARIMIDEX) 1 MG tablet, Take 1 tablet (1 mg total) by mouth daily., Disp: 90 tablet, Rfl: 3   atorvastatin (LIPITOR) 40 MG tablet, Take 40 mg by mouth daily., Disp: , Rfl:    cyanocobalamin (V-R VITAMIN B-12) 500 MCG tablet, Take 1 tablet (500 mcg total) by mouth daily., Disp: 90 tablet, Rfl: 3   diclofenac Sodium (VOLTAREN) 1 % GEL, Apply 4 g topically 4 (four) times daily., Disp: 50 g, Rfl: 0   ferrous sulfate 325 (65 FE) MG EC tablet, Take 1 tablet (325 mg total) by mouth daily with breakfast., Disp: 90 tablet, Rfl: 3   hydrochlorothiazide (HYDRODIURIL) 12.5 MG tablet, Take 12.5 mg by mouth every morning., Disp: , Rfl:    losartan (COZAAR) 100 MG tablet, Take 1 tablet (100 mg total) by mouth daily., Disp: 90 tablet, Rfl: 3   magnesium gluconate (MAGONATE) 500 MG tablet, Take  500 mg by mouth daily., Disp: , Rfl:    meloxicam (MOBIC) 7.5 MG tablet, Take 1 tablet (7.5 mg total) by mouth daily., Disp: 30 tablet, Rfl: 0   Menthol-Camphor 11-11 % CREA, Apply 1 application  topically 3 (three) times daily as needed (for pain (back))., Disp: , Rfl:    Polyethyl Glycol-Propyl Glycol (SYSTANE) 0.4-0.3 % SOLN, Place 1 drop into both eyes daily., Disp: , Rfl:   Social History   Tobacco Use  Smoking Status Never  Smokeless Tobacco Never    No Known Allergies Objective:  There were no vitals filed for this visit. There is no height or weight on file to calculate BMI. Constitutional Well developed. Well nourished.  Vascular Dorsalis pedis pulses palpable bilaterally. Posterior tibial pulses palpable bilaterally. Capillary refill normal to all digits.  No cyanosis or clubbing noted. Pedal hair growth normal.  Neurologic Normal speech. Oriented to person, place, and time. Epicritic sensation to light touch grossly present bilaterally.  Dermatologic Nails well groomed and normal in appearance. No open wounds. No skin lesions.  Orthopedic: Pain on palpation to the left dorsal midfoot arthritis.  Clinically able to appreciate the underlying arthritis.  Some capsulitis noted.  Negative extensor tendinitis noted.  Lisfranc interval intact   Radiographs:  None Assessment:   1. Arthritis of left midfoot    Plan:  Patient was evaluated and treated and all questions answered.  Left dorsal midfoot arthritis - All questions and concerns were discussed with the patient in extensive detail given the mild arthritis was present patient will benefit from a steroid injection however patient would like to hold off on injection for now.  I discussed that if he regresses or continues to worsen she will come back and see me.  She states understanding.  No follow-ups on file.

## 2023-12-06 ENCOUNTER — Ambulatory Visit

## 2023-12-23 ENCOUNTER — Ambulatory Visit: Payer: Medicare Other | Admitting: Internal Medicine

## 2024-01-19 DIAGNOSIS — M8580 Other specified disorders of bone density and structure, unspecified site: Secondary | ICD-10-CM | POA: Diagnosis not present

## 2024-01-19 DIAGNOSIS — E875 Hyperkalemia: Secondary | ICD-10-CM | POA: Diagnosis not present

## 2024-01-19 DIAGNOSIS — N1831 Chronic kidney disease, stage 3a: Secondary | ICD-10-CM | POA: Diagnosis not present

## 2024-01-19 DIAGNOSIS — E782 Mixed hyperlipidemia: Secondary | ICD-10-CM | POA: Diagnosis not present

## 2024-01-19 DIAGNOSIS — M545 Low back pain, unspecified: Secondary | ICD-10-CM | POA: Diagnosis not present

## 2024-01-19 DIAGNOSIS — M79621 Pain in right upper arm: Secondary | ICD-10-CM | POA: Diagnosis not present

## 2024-01-19 DIAGNOSIS — I10 Essential (primary) hypertension: Secondary | ICD-10-CM | POA: Diagnosis not present

## 2024-01-19 DIAGNOSIS — M810 Age-related osteoporosis without current pathological fracture: Secondary | ICD-10-CM | POA: Diagnosis not present

## 2024-01-19 DIAGNOSIS — I129 Hypertensive chronic kidney disease with stage 1 through stage 4 chronic kidney disease, or unspecified chronic kidney disease: Secondary | ICD-10-CM | POA: Diagnosis not present

## 2024-01-19 DIAGNOSIS — R011 Cardiac murmur, unspecified: Secondary | ICD-10-CM | POA: Diagnosis not present

## 2024-01-19 DIAGNOSIS — M170 Bilateral primary osteoarthritis of knee: Secondary | ICD-10-CM | POA: Diagnosis not present

## 2024-01-19 DIAGNOSIS — E1122 Type 2 diabetes mellitus with diabetic chronic kidney disease: Secondary | ICD-10-CM | POA: Diagnosis not present

## 2024-01-19 DIAGNOSIS — Z853 Personal history of malignant neoplasm of breast: Secondary | ICD-10-CM | POA: Diagnosis not present

## 2024-01-19 DIAGNOSIS — M79622 Pain in left upper arm: Secondary | ICD-10-CM | POA: Diagnosis not present

## 2024-02-16 DIAGNOSIS — E1122 Type 2 diabetes mellitus with diabetic chronic kidney disease: Secondary | ICD-10-CM | POA: Diagnosis not present

## 2024-02-16 DIAGNOSIS — R011 Cardiac murmur, unspecified: Secondary | ICD-10-CM | POA: Diagnosis not present

## 2024-02-16 DIAGNOSIS — M79609 Pain in unspecified limb: Secondary | ICD-10-CM | POA: Diagnosis not present

## 2024-02-16 DIAGNOSIS — I10 Essential (primary) hypertension: Secondary | ICD-10-CM | POA: Diagnosis not present

## 2024-02-16 DIAGNOSIS — M8580 Other specified disorders of bone density and structure, unspecified site: Secondary | ICD-10-CM | POA: Diagnosis not present

## 2024-02-16 DIAGNOSIS — M545 Low back pain, unspecified: Secondary | ICD-10-CM | POA: Diagnosis not present

## 2024-02-16 DIAGNOSIS — N1831 Chronic kidney disease, stage 3a: Secondary | ICD-10-CM | POA: Diagnosis not present

## 2024-02-16 DIAGNOSIS — E782 Mixed hyperlipidemia: Secondary | ICD-10-CM | POA: Diagnosis not present

## 2024-02-16 DIAGNOSIS — M170 Bilateral primary osteoarthritis of knee: Secondary | ICD-10-CM | POA: Diagnosis not present

## 2024-02-16 DIAGNOSIS — H269 Unspecified cataract: Secondary | ICD-10-CM | POA: Diagnosis not present

## 2024-02-16 DIAGNOSIS — E875 Hyperkalemia: Secondary | ICD-10-CM | POA: Diagnosis not present

## 2024-02-16 DIAGNOSIS — Z853 Personal history of malignant neoplasm of breast: Secondary | ICD-10-CM | POA: Diagnosis not present

## 2024-03-15 ENCOUNTER — Other Ambulatory Visit (INDEPENDENT_AMBULATORY_CARE_PROVIDER_SITE_OTHER): Payer: Self-pay

## 2024-03-15 ENCOUNTER — Ambulatory Visit: Admitting: Orthopedic Surgery

## 2024-03-15 VITALS — BP 161/94 | HR 82 | Ht 63.0 in | Wt 157.0 lb

## 2024-03-15 DIAGNOSIS — M5416 Radiculopathy, lumbar region: Secondary | ICD-10-CM | POA: Diagnosis not present

## 2024-03-15 DIAGNOSIS — G8929 Other chronic pain: Secondary | ICD-10-CM

## 2024-03-16 ENCOUNTER — Encounter: Payer: Self-pay | Admitting: Orthopedic Surgery

## 2024-03-16 NOTE — Progress Notes (Signed)
 Orthopaedic Clinic Return  Assessment: [name] is a 83 y.o. female with the following: Low back pain  Plan: Chronic low back pain with radicular symptoms.  XR with degenerative changes and mild anterolisthesis.  Not interested in srgery.  No injections.  Discussed PT but she cannot afford it.  Continue with medicines as needed.  Can use a heating pad.  Follow up as needed.   Follow-up: Return if symptoms worsen or fail to improve.   Subjective:  Chief Complaint  Patient presents with   Back Pain    LBP down both legs to the feet for yrs.     History of Present Illness: [name] is a 83 y.o. female who returns to clinic for evaluation of LBP.  Chrnic pain in back with radiating pains into both legs.  No numbness or tingling.  Uses a cane to assist with ambulation.  Occasional tylenol .  No specific injury.  She feels weak.    Review of Systems: No fevers or chills No numbness or tingling No chest pain No shortness of breath No bowel or bladder dysfunction No GI distress No headaches   Objective: BP (!) 161/94   Pulse 82   Ht 5' 3 (1.6 m)   Wt 157 lb (71.2 kg)   LMP 12/05/2019   BMI 27.81 kg/m   Physical Exam:  Elderly female.   Alert and oriented.  No acute distress.   Tenderness in low back.  Negative straight leg raise.  Good LE strength.  Cane for ambulation.   IMAGING: I personally ordered and reviewed the following images:   Standing lumbar spine XR obtained in clinic today.  No acute injury.  Diffuse degenerative changes. Mild anterolisthesis at L4-5.  Large anterior based osteophytes.  Questionable sagittal balance.   Impression:  Lumbar spine XR with diffuse degenerative changes.   Oneil DELENA Horde, MD 03/16/2024 11:40 PM

## 2024-03-27 ENCOUNTER — Telehealth: Payer: Self-pay

## 2024-03-28 NOTE — Telephone Encounter (Signed)
 Overdue on losartan  and pravastatin, pharmacy to fill both today. Will check back in later this week to confirm

## 2024-03-30 ENCOUNTER — Telehealth: Payer: Self-pay

## 2024-03-30 NOTE — Telephone Encounter (Signed)
 Refills confirmed on 03/28/24, up to date on meds, next review in October

## 2024-05-16 DIAGNOSIS — E782 Mixed hyperlipidemia: Secondary | ICD-10-CM | POA: Diagnosis not present

## 2024-05-16 DIAGNOSIS — E1122 Type 2 diabetes mellitus with diabetic chronic kidney disease: Secondary | ICD-10-CM | POA: Diagnosis not present

## 2024-05-16 DIAGNOSIS — M8580 Other specified disorders of bone density and structure, unspecified site: Secondary | ICD-10-CM | POA: Diagnosis not present

## 2024-05-17 LAB — LAB REPORT - SCANNED
A1c: 6.4
Albumin, Urine POC: 40.9
Creatinine, POC: 72.6 mg/dL
EGFR: 43
Microalb Creat Ratio: 56

## 2024-05-22 DIAGNOSIS — I129 Hypertensive chronic kidney disease with stage 1 through stage 4 chronic kidney disease, or unspecified chronic kidney disease: Secondary | ICD-10-CM | POA: Diagnosis not present

## 2024-05-22 DIAGNOSIS — M79609 Pain in unspecified limb: Secondary | ICD-10-CM | POA: Diagnosis not present

## 2024-05-22 DIAGNOSIS — Z853 Personal history of malignant neoplasm of breast: Secondary | ICD-10-CM | POA: Diagnosis not present

## 2024-05-22 DIAGNOSIS — E1122 Type 2 diabetes mellitus with diabetic chronic kidney disease: Secondary | ICD-10-CM | POA: Diagnosis not present

## 2024-05-22 DIAGNOSIS — M8580 Other specified disorders of bone density and structure, unspecified site: Secondary | ICD-10-CM | POA: Diagnosis not present

## 2024-05-22 DIAGNOSIS — R011 Cardiac murmur, unspecified: Secondary | ICD-10-CM | POA: Diagnosis not present

## 2024-05-22 DIAGNOSIS — I10 Essential (primary) hypertension: Secondary | ICD-10-CM | POA: Diagnosis not present

## 2024-05-22 DIAGNOSIS — M170 Bilateral primary osteoarthritis of knee: Secondary | ICD-10-CM | POA: Diagnosis not present

## 2024-05-22 DIAGNOSIS — M545 Low back pain, unspecified: Secondary | ICD-10-CM | POA: Diagnosis not present

## 2024-05-22 DIAGNOSIS — E875 Hyperkalemia: Secondary | ICD-10-CM | POA: Diagnosis not present

## 2024-05-22 DIAGNOSIS — N1831 Chronic kidney disease, stage 3a: Secondary | ICD-10-CM | POA: Diagnosis not present

## 2024-05-22 DIAGNOSIS — E782 Mixed hyperlipidemia: Secondary | ICD-10-CM | POA: Diagnosis not present

## 2024-05-23 ENCOUNTER — Ambulatory Visit: Payer: Self-pay | Admitting: Internal Medicine

## 2024-05-23 ENCOUNTER — Encounter: Payer: Self-pay | Admitting: Internal Medicine

## 2024-07-03 ENCOUNTER — Ambulatory Visit: Admitting: Podiatry

## 2024-07-03 ENCOUNTER — Ambulatory Visit

## 2024-07-03 DIAGNOSIS — M19072 Primary osteoarthritis, left ankle and foot: Secondary | ICD-10-CM

## 2024-07-03 MED ORDER — TRIAMCINOLONE ACETONIDE 10 MG/ML IJ SUSP
10.0000 mg | Freq: Once | INTRAMUSCULAR | Status: AC
  Administered 2024-07-03: 10 mg via INTRAMUSCULAR

## 2024-07-03 NOTE — Patient Instructions (Signed)

## 2024-07-04 NOTE — Progress Notes (Signed)
 Subjective:   Patient ID: [name], female   DOB: 83 y.o.   MRN: [id number]   HPI Chief Complaint  Patient presents with   Foot Pain    Left ankle pain and swelling    83 year old female presents the office today for evaluation of left foot pain.  She was seen in the office earlier this year and she did not want an injection but today she is requesting an injection on the foot.  She does not recall any recent injuries or trauma to the foot.  She has been some chronic swelling present, but states this is not new.  Review of Systems  All other systems reviewed and are negative.       Objective:  Physical Exam  General: AAO x3, NAD  Dermatological: Skin is warm, dry and supple bilateral. There are no open sores, no preulcerative lesions, no rash or signs of infection present.  Vascular: Dorsalis Pedis artery and Posterior Tibial artery pedal pulses are 2/4 bilateral with immedate capillary fill time. There is no pain with calf compression, swelling, warmth, erythema.   Neruologic: Grossly intact via light touch bilateral.   Musculoskeletal: There is no specific area pinpoint tenderness.  Majority discomfort on the sinus tarsi on the lateral aspect and there is localized  edema present to this area.  There is no erythema or warmth.  She does get some discomfort to the bottom of the heel on the insertion of plantar fascia.  There is no pain with lateral compression of the calcaneus.  No pain in the Achilles tendon.  Unable to appreciate any other areas of discomfort at this time.       Assessment:   Arthritis left foot     Plan:  -Treatment options discussed including all alternatives, risks, and complications -Etiology of symptoms were discussed -X-rays were obtained and reviewed with the patient.  Multiple views of the foot, ankle were obtained and reviewed.  There is some mild arthritic changes present.  There is no evidence of acute fracture.  Calcaneal spurring is  present. -She was to proceed with a steroid injection.  We decided to proceed with an injection of the sinus tarsi.  Verbal consent obtained.  I cleaned the skin with alcohol, Betadine .  A mixture of 1 cc Kenalog  10, 0.5 cc of Marcaine  plain, 0.5 cc of lidocaine  plain was infiltrated into the sinus tarsi without complications.  Postinjection care discussed.  Tolerated well. -Continue supportive shoe gear.  Return in about 6 weeks (around 08/14/2024), or if symptoms worsen or fail to improve, for left foot pain.  Donnice JONELLE Fees DPM

## 2024-07-10 ENCOUNTER — Other Ambulatory Visit: Payer: Self-pay | Admitting: *Deleted

## 2024-07-10 DIAGNOSIS — C50211 Malignant neoplasm of upper-inner quadrant of right female breast: Secondary | ICD-10-CM

## 2024-07-10 MED ORDER — ANASTROZOLE 1 MG PO TABS: 1.0000 mg | ORAL_TABLET | Freq: Every day | ORAL | 3 refills | Status: AC

## 2024-07-10 NOTE — Telephone Encounter (Signed)
 Tolerating anastrozole  without side effects.  Per OVN patient is to continue therapy.

## 2024-08-14 ENCOUNTER — Ambulatory Visit: Admitting: Podiatry

## 2024-08-14 DIAGNOSIS — M19072 Primary osteoarthritis, left ankle and foot: Secondary | ICD-10-CM

## 2024-08-14 MED ORDER — TRIAMCINOLONE ACETONIDE 10 MG/ML IJ SUSP
10.0000 mg | Freq: Once | INTRAMUSCULAR | Status: AC
  Administered 2024-08-14: 15:00:00 10 mg via INTRA_ARTICULAR

## 2024-08-14 NOTE — Progress Notes (Signed)
 Subjective:   Patient ID: [name], female   DOB: 83 y.o.   MRN: [id number]   HPI Chief Complaint  Patient presents with   Arthritis    Pt stated that she feels like things are still about the same      83 year old female presents the office today for evaluation of left foot pain.  She states that the last appointment she has felt great but the pain is returned although doing somewhat better.  She does not report any recent injuries or falls or any other changes.    Review of Systems  All other systems reviewed and are negative.       Objective:  Physical Exam  General: AAO x3, NAD  Dermatological: Skin is warm, dry and supple bilateral. There are no open sores, no preulcerative lesions, no rash or signs of infection present.  Vascular: Dorsalis Pedis artery and Posterior Tibial artery pedal pulses are 2/4 bilateral with immedate capillary fill time. There is no pain with calf compression, swelling, warmth, erythema.   Neruologic: Grossly intact via light touch bilateral.   Musculoskeletal: There is no specific area pinpoint tenderness.  There is still tenderness palpation along the sinus tarsi and there is localized edema present.  There is no erythema or warmth.  There is no other areas of tenderness identified at this time.     Assessment:   Arthritis left foot     Plan:  -Treatment options discussed including all alternatives, risks, and complications -Etiology of symptoms were discussed -Discussed repeat steroid injection.  She was to proceed with a steroid injection.  We decided to proceed with an injection of the sinus tarsi.  Verbal consent obtained.  I cleaned the skin with alcohol, Betadine .  A mixture of 1 cc Kenalog  10, 0.5 cc of Marcaine  plain, 0.5 cc of lidocaine  plain was infiltrated into the sinus tarsi without complications.  Postinjection care discussed.  Tolerated well. -Dispensed compression anklet -Continue supportive shoe gear.  [name] DPM

## 2024-08-14 NOTE — Patient Instructions (Signed)

## 2024-08-28 ENCOUNTER — Encounter: Payer: Self-pay | Admitting: *Deleted

## 2024-10-11 ENCOUNTER — Inpatient Hospital Stay: Payer: Medicare Other

## 2024-10-17 ENCOUNTER — Inpatient Hospital Stay: Admitting: Physician Assistant

## 2024-10-18 ENCOUNTER — Ambulatory Visit: Payer: Medicare Other | Admitting: Physician Assistant

## 2024-12-28 ENCOUNTER — Ambulatory Visit: Admitting: Internal Medicine
